# Patient Record
Sex: Male | Born: 1935 | Race: White | Hispanic: No | Smoking: Former smoker
Health system: Southern US, Community
[De-identification: ages and names within clinical notes are randomized; demographics above are authoritative.]

## PROBLEM LIST (undated history)

## (undated) DIAGNOSIS — M1990 Unspecified osteoarthritis, unspecified site: Secondary | ICD-10-CM

## (undated) DIAGNOSIS — G4733 Obstructive sleep apnea (adult) (pediatric): Secondary | ICD-10-CM

## (undated) DIAGNOSIS — C801 Malignant (primary) neoplasm, unspecified: Secondary | ICD-10-CM

## (undated) DIAGNOSIS — N200 Calculus of kidney: Secondary | ICD-10-CM

## (undated) DIAGNOSIS — I10 Essential (primary) hypertension: Secondary | ICD-10-CM

## (undated) DIAGNOSIS — F419 Anxiety disorder, unspecified: Secondary | ICD-10-CM

## (undated) DIAGNOSIS — I82402 Acute embolism and thrombosis of unspecified deep veins of left lower extremity: Secondary | ICD-10-CM

## (undated) DIAGNOSIS — J449 Chronic obstructive pulmonary disease, unspecified: Secondary | ICD-10-CM

## (undated) DIAGNOSIS — Z8551 Personal history of malignant neoplasm of bladder: Secondary | ICD-10-CM

## (undated) DIAGNOSIS — I4891 Unspecified atrial fibrillation: Secondary | ICD-10-CM

## (undated) DIAGNOSIS — F32A Depression, unspecified: Secondary | ICD-10-CM

## (undated) DIAGNOSIS — I499 Cardiac arrhythmia, unspecified: Secondary | ICD-10-CM

## (undated) DIAGNOSIS — R011 Cardiac murmur, unspecified: Secondary | ICD-10-CM

## (undated) DIAGNOSIS — E039 Hypothyroidism, unspecified: Secondary | ICD-10-CM

## (undated) DIAGNOSIS — J189 Pneumonia, unspecified organism: Secondary | ICD-10-CM

## (undated) DIAGNOSIS — R911 Solitary pulmonary nodule: Secondary | ICD-10-CM

## (undated) DIAGNOSIS — E785 Hyperlipidemia, unspecified: Secondary | ICD-10-CM

## (undated) DIAGNOSIS — Z9981 Dependence on supplemental oxygen: Secondary | ICD-10-CM

## (undated) DIAGNOSIS — M810 Age-related osteoporosis without current pathological fracture: Secondary | ICD-10-CM

## (undated) DIAGNOSIS — E119 Type 2 diabetes mellitus without complications: Secondary | ICD-10-CM

## (undated) DIAGNOSIS — F329 Major depressive disorder, single episode, unspecified: Secondary | ICD-10-CM

## (undated) HISTORY — PX: TONSILLECTOMY: SUR1361

## (undated) HISTORY — PX: VASCULAR SURGERY: SHX849

## (undated) HISTORY — DX: Chronic obstructive pulmonary disease, unspecified: J44.9

## (undated) HISTORY — PX: VARICOSE VEIN SURGERY: SHX832

## (undated) HISTORY — DX: Obstructive sleep apnea (adult) (pediatric): G47.33

## (undated) HISTORY — DX: Hypothyroidism, unspecified: E03.9

## (undated) HISTORY — DX: Anxiety disorder, unspecified: F41.9

## (undated) HISTORY — PX: EYE SURGERY: SHX253

## (undated) HISTORY — DX: Essential (primary) hypertension: I10

## (undated) HISTORY — DX: Personal history of malignant neoplasm of bladder: Z85.51

## (undated) HISTORY — DX: Solitary pulmonary nodule: R91.1

## (undated) HISTORY — DX: Unspecified atrial fibrillation: I48.91

## (undated) HISTORY — DX: Hyperlipidemia, unspecified: E78.5

## (undated) HISTORY — PX: MULTIPLE TOOTH EXTRACTIONS: SHX2053

---

## 1998-08-21 ENCOUNTER — Ambulatory Visit (HOSPITAL_COMMUNITY): Admission: RE | Admit: 1998-08-21 | Discharge: 1998-08-21 | Payer: Self-pay | Admitting: *Deleted

## 1998-08-21 ENCOUNTER — Encounter: Payer: Self-pay | Admitting: *Deleted

## 1998-11-23 ENCOUNTER — Ambulatory Visit (HOSPITAL_BASED_OUTPATIENT_CLINIC_OR_DEPARTMENT_OTHER): Admission: RE | Admit: 1998-11-23 | Discharge: 1998-11-23 | Payer: Self-pay | Admitting: *Deleted

## 1999-04-10 ENCOUNTER — Encounter: Admission: RE | Admit: 1999-04-10 | Discharge: 1999-04-10 | Payer: Self-pay | Admitting: *Deleted

## 1999-04-10 ENCOUNTER — Encounter: Payer: Self-pay | Admitting: *Deleted

## 1999-04-20 ENCOUNTER — Ambulatory Visit (HOSPITAL_COMMUNITY): Admission: RE | Admit: 1999-04-20 | Discharge: 1999-04-20 | Payer: Self-pay | Admitting: *Deleted

## 1999-04-20 ENCOUNTER — Encounter: Payer: Self-pay | Admitting: *Deleted

## 1999-05-03 ENCOUNTER — Encounter: Payer: Self-pay | Admitting: *Deleted

## 1999-05-03 ENCOUNTER — Ambulatory Visit (HOSPITAL_COMMUNITY): Admission: RE | Admit: 1999-05-03 | Discharge: 1999-05-03 | Payer: Self-pay | Admitting: *Deleted

## 1999-05-08 ENCOUNTER — Ambulatory Visit (HOSPITAL_COMMUNITY): Admission: RE | Admit: 1999-05-08 | Discharge: 1999-05-08 | Payer: Self-pay | Admitting: *Deleted

## 1999-05-08 ENCOUNTER — Encounter: Payer: Self-pay | Admitting: *Deleted

## 2003-09-12 ENCOUNTER — Ambulatory Visit (HOSPITAL_COMMUNITY): Admission: RE | Admit: 2003-09-12 | Discharge: 2003-09-12 | Payer: Self-pay | Admitting: Endocrinology

## 2004-07-04 ENCOUNTER — Ambulatory Visit (HOSPITAL_COMMUNITY): Admission: AD | Admit: 2004-07-04 | Discharge: 2004-07-07 | Payer: Self-pay | Admitting: Orthopaedic Surgery

## 2004-09-10 ENCOUNTER — Encounter: Admission: RE | Admit: 2004-09-10 | Discharge: 2004-09-10 | Payer: Self-pay | Admitting: Internal Medicine

## 2004-11-25 ENCOUNTER — Encounter: Admission: RE | Admit: 2004-11-25 | Discharge: 2004-11-25 | Payer: Self-pay | Admitting: Neurology

## 2006-02-14 ENCOUNTER — Ambulatory Visit (HOSPITAL_BASED_OUTPATIENT_CLINIC_OR_DEPARTMENT_OTHER): Admission: RE | Admit: 2006-02-14 | Discharge: 2006-02-14 | Payer: Self-pay | Admitting: Urology

## 2006-02-14 ENCOUNTER — Encounter (INDEPENDENT_AMBULATORY_CARE_PROVIDER_SITE_OTHER): Payer: Self-pay | Admitting: Specialist

## 2006-02-15 ENCOUNTER — Emergency Department (HOSPITAL_COMMUNITY): Admission: EM | Admit: 2006-02-15 | Discharge: 2006-02-15 | Payer: Self-pay | Admitting: Emergency Medicine

## 2006-05-23 ENCOUNTER — Encounter (INDEPENDENT_AMBULATORY_CARE_PROVIDER_SITE_OTHER): Payer: Self-pay | Admitting: Specialist

## 2006-05-23 ENCOUNTER — Ambulatory Visit (HOSPITAL_BASED_OUTPATIENT_CLINIC_OR_DEPARTMENT_OTHER): Admission: RE | Admit: 2006-05-23 | Discharge: 2006-05-23 | Payer: Self-pay | Admitting: Urology

## 2007-12-14 ENCOUNTER — Observation Stay (HOSPITAL_COMMUNITY): Admission: EM | Admit: 2007-12-14 | Discharge: 2007-12-15 | Payer: Self-pay | Admitting: Emergency Medicine

## 2007-12-25 HISTORY — PX: CARDIOVASCULAR STRESS TEST: SHX262

## 2008-02-21 ENCOUNTER — Ambulatory Visit (HOSPITAL_BASED_OUTPATIENT_CLINIC_OR_DEPARTMENT_OTHER): Admission: RE | Admit: 2008-02-21 | Discharge: 2008-02-21 | Payer: Self-pay | Admitting: Cardiovascular Disease

## 2008-02-27 ENCOUNTER — Ambulatory Visit: Payer: Self-pay | Admitting: Internal Medicine

## 2009-09-29 ENCOUNTER — Encounter: Admission: RE | Admit: 2009-09-29 | Discharge: 2009-09-29 | Payer: Self-pay | Admitting: Endocrinology

## 2009-11-07 ENCOUNTER — Ambulatory Visit: Payer: Self-pay | Admitting: Cardiovascular Disease

## 2010-03-27 ENCOUNTER — Other Ambulatory Visit: Payer: Self-pay | Admitting: Orthopaedic Surgery

## 2010-03-27 DIAGNOSIS — M545 Low back pain: Secondary | ICD-10-CM

## 2010-03-29 ENCOUNTER — Ambulatory Visit
Admission: RE | Admit: 2010-03-29 | Discharge: 2010-03-29 | Disposition: A | Discharge status: Home / Self Care | Payer: Managed Care, Other (non HMO) | Source: Ambulatory Visit | Attending: Orthopaedic Surgery | Admitting: Orthopaedic Surgery

## 2010-03-29 DIAGNOSIS — M545 Low back pain: Secondary | ICD-10-CM

## 2010-03-29 MED ORDER — GADOBENATE DIMEGLUMINE 529 MG/ML IV SOLN
20.0000 mL | Freq: Once | INTRAVENOUS | Status: AC | PRN
  Administered 2010-03-29: 20 mL via INTRAVENOUS

## 2010-06-13 ENCOUNTER — Encounter: Payer: Self-pay | Admitting: Cardiovascular Disease

## 2010-06-19 NOTE — Procedures (Signed)
NAMEJOSHUAL, [name] NO.:  [id number]   MEDICAL RECORD NO.:  [id number]          PATIENT TYPE:  OUT   LOCATION:  SLEEP CENTER                 FACILITY:  [facility]   PHYSICIAN:  Clinton D. Maple Hudson, MD, FCCP, FACPDATE OF BIRTH:  11-30[birth year]1930   DATE OF STUDY:  02/21/2008                            NOCTURNAL POLYSOMNOGRAM   REFERRING PHYSICIAN:  Vesta Mixer, M.D.   INDICATION FOR STUDY:  Hypersomnia with sleep apnea.   EPWORTH SLEEPINESS SCORE:  15/24.  BMI 32.9.  Weight 210 pounds, height  67 inches, neck 17 inches.   MEDICATIONS:  Home medications are charted and reviewed.   SLEEP ARCHITECTURE:  Total sleep time 307 minutes with sleep efficiency  75%.  Stage I was 3.6%.  Stage II 90.7%.  Stage III absent.  REM 5.7% of  total sleep time.  Sleep latency 31 minutes.  REM latency 99 minutes.  Awake after sleep onset 70 minutes.  Arousal index 19.3.  No bedtime  medication was taken.   RESPIRATORY DATA:  Apnea-hypopnea index (AHI) is 7.6 per hour.  A total  of 39 events were scored including 37 obstructive apneas and 2  hypopneas.  Events were not positional.  REM AHI 58.3.  There were  insufficient early events to permit CPAP titration by split protocol on  the study night.   OXYGEN DATA:  Moderate-to-loud snoring with oxygen desaturation to a  nadir of 80%.  Because of persistent oxygen desaturation the technician  placed 1L per minute of nasal oxygen at 11:02 p.m. and increased it to 2  L per minute at 1:14 a.m.  Mean oxygen saturation across the study was  91.1% with most of the study recorded on 2 L per minute.   CARDIAC DATA:  Sinus rhythm with occasional PVC.   MOVEMENT-PARASOMNIA:  Frequent limb movement with a total of 101 limb  jerks recorded of which 2 were associated with arousal or awakening for  an arousal index of 0.4 per hour.  Bathroom x2.   IMPRESSIONS-RECOMMENDATIONS:  1. Mild obstructive sleep apnea/hypopnea syndrome, AHI 7.6 per hour  with nonpositional events, moderately loud snoring and oxygen      desaturation to a nadir of 80%.  2. Significant oxygen desaturation with a nadir of 80% on room air.      Because of sustained oxygen desaturation most of the study was      recorded by protocol with the patient wearing 2 L per minute nasal      oxygen.  Mean oxygen saturation reflecting supplemental oxygen was      91.1% through the study.  This suggested presence of significant      underlying cardiopulmonary disease and potential requirement for      supplemental oxygen at home.  A total of 52.7 minutes was recorded      during this study with saturation less than 88%.  3. He had insufficient events to permit CPAP titration by split      protocol on the study night.  He may still be a      candidate for CPAP trial if more conservative measures are  insufficient.  Consider return for CPAP titration.  Reevaluate for      alternative management as appropriate.      Clinton D. Maple Hudson, MD, [facility], FACP  Diplomate, Biomedical engineer of Sleep Medicine  Electronically Signed     CDY/MEDQ  D:  02/27/2008 10:54:15  T:  02/28/2008 02:14:15  Job:  [id number]

## 2010-06-19 NOTE — Discharge Summary (Signed)
Taylor [name], Taylor [name] NO.:  [id number]   MEDICAL RECORD NO.:  [id number]          PATIENT TYPE:  INP   LOCATION:  2041                         FACILITY:  [facility]   PHYSICIAN:  Vesta Mixer, M.D. DATE OF BIRTH:  12/07/1928   DATE OF ADMISSION:  12/14/2007  DATE OF DISCHARGE:  12/15/2007                               DISCHARGE SUMMARY   DISCHARGE DIAGNOSES:  1. Noncardiac chest pain.  2. History of chronic obstructive pulmonary disease.  3. History of atrial fibrillation.  4. Hypothyroidism.  5. Hyperlipidemia.  6. Glaucoma.  7. Anxiety.  8. History of bladder cancer.  9. Small lung nodule, diagnosed by CT scan   DISCHARGE MEDICATIONS:  1. GlycoLax as needed.  2. Cartia XT 180 mg a day.  3. Synthroid 0.1 mg a day.  4. Aspirin 81 mg a day.  5. Lipitor 10 mg a day.  6. Serzone 75 mg a day.  7. Xanax 0.25 mg a day.  8. Flomax 50 mcg a day.  9. Xalatan eye drops once a day.  10.Tramadol with acetaminophen as needed.   DISPOSITION:  The patient will see Dr. Elease Hashimoto for a stress test in 1 or  2 weeks.  He is to call and ask for bed.  He is to see Dr. Evlyn Kanner for  further evaluation and management of his other problems.   HISTORY:  [name] is a 77 year old gentleman who is admitted to the  hospital with an episode of chest pain.  Please see dictated H&P for  further details.   HOSPITAL COURSE:  1. Chest pain.  The patient ruled out for myocardial infarction.  He      did not have any EKG changes.  He did have a CT scan, which ruled      out pulmonary embolus.  He was found to have a small pulmonary      nodule which was not thought to be serious.  Recommendation was      made for followup CT scan at some point in the future.  We      will see him as an outpatient and set up a stress Cardiolite study.  2. COPD.  The patient did have some wheezing.  He was given some      Xopenex in the hospital.  He may need an inhaler as an outpatient.      Dr. Evlyn Kanner  will address this further.  All of his other medical      problems were stable.      Vesta Mixer, M.D.  Electronically Signed     PJN/MEDQ  D:  12/15/2007  T:  12/16/2007  Job:  [id number]   cc:   Jeannett Senior A. Evlyn Kanner, M.D.

## 2010-06-19 NOTE — H&P (Signed)
NAMEFREEMAN, BORBA NO.:  [id number]   MEDICAL RECORD NO.:  [id number]          PATIENT TYPE:  INP   LOCATION:  2041                         FACILITY:  [facility]   PHYSICIAN:  Vesta Mixer, M.D. DATE OF BIRTH:  16-Jul-1928   DATE OF ADMISSION:  12/14/2007  DATE OF DISCHARGE:                              HISTORY & PHYSICAL   Taylor [name] is a 77 year old gentleman who is admitted with some  episodes of chest pain.   Mr. Rappaport has a long history of cigarette smoking.  He had some  intermittent atrial fibrillation and hypothyroidism.  He has had a  stress test in our office which was negative for ischemia.  The patient  has been in fairly good health.  He started having a little bit of chest  pain perhaps as early as last night but certainly this morning he  developed some severe chest pain.  It worsened with the rest.  There was  no diaphoresis.  He does not get any regular exercise.  It did not  particularly worsened with movement.  The pain lasted for about an hour  and was off and on.  The family was relieved when he arrived in the  emergency room.  He did not have any nausea or vomiting.  He denies any  hemoptysis or blood in his stools.   He denies any syncope or presyncope.   CURRENT MEDICATIONS:  1. Cartia XT 180 mg a day.  2. Synthroid 0.1 mg a day.  3. Aspirin 81 mg a day.  4. Lipitor 10 mg a day.  5. Serzone 75 mg a day.  6. Xanax 0.25 mg nightly as needed.  7. Flonase 50 mcg a day.  8. Xalatan eye drops.  9. Tramadol with acetaminophen 50 mg four times a day as needed.   ALLERGIES:  None.   PAST MEDICAL HISTORY:  1. History of bladder cancer.  2. Probable COPD.  3. Glaucoma.  4. Hypercholesterolemia.  5. Hypothyroidism.  6. Skin cancer.  7. Status post history of a TURP based on history of back surgery.   SOCIAL HISTORY:  The patient smokes two pack of cigarettes a day.   Family history is unremarkable.   His review of systems is  reviewed and is essentially negative except as  noted in the HPI.   PHYSICAL EXAMINATION:  GENERAL:  He is an elderly gentleman in no acute  distress.  He is alert and oriented x3 and his mood and affect are  normal.  VITAL SIGNS:  His blood pressure is 136/62 with a heart rate of 90.  HEENT:  Carotids 2+, he has no bruits, no JVD, no thyromegaly.  LUNG:  Bilateral wheezing.  HEART:  Regular rate, S1-S2.  ABDOMEN:  Good bowel sounds and is nontender.  EXTREMITIES:  There is no clubbing, cyanosis or edema.  NEURO:  Nonfocal.   His EKG reveals normal sinus rhythm.  There is no ST or T-wave changes  and EKG is essentially unchanged from his previous tracing.   LABORATORY DATA:  His white blood cell count is 8.8, his  hemoglobin is  17.8, hematocrit is 52.6.  His sodium is 130, potassium is 3.6,  chlorides 103, CO2 is 29 chloride, BUN is 10, creatinine is 0.84,  glucose is 189.  His BNP is less than 30.  His CPK-MB is 1.1.  His  troponin is less than 0.05.   IMPRESSION AND PLAN:  Chest pain.  The patient presents with some rather  atypical chest pains.  He is a heavy smoker, and he does have  hyperglycemia.  We will collect serial cardiac enzymes.  In the morning,  I would like to do a CT angio to rule out a pulmonary embolus.  We will  anticipate discharging him tomorrow morning if his enzymes return  negative and his EKG remains unremarkable.  He will need some attention  with his hyperglycemia.  We will collect Accu-Cheks tonight.   He also has elevated hemoglobin level.  I suspect that he has either  some sleep apnea or perhaps this is due to his smoking.  This may need  to be addressed as an outpatient.      Vesta Mixer, M.D.  Electronically Signed     PJN/MEDQ  D:  12/14/2007  T:  12/15/2007  Job:  [id number]   cc:   Jeannett Senior A. Evlyn Kanner, M.D.

## 2010-06-22 NOTE — Op Note (Signed)
Taylor [name], Taylor [name]               ACCOUNT NO.:  [id number]   MEDICAL RECORD NO.:  [id number]          PATIENT TYPE:  AMB   LOCATION:  [facility]                         FACILITY:  [facility]   PHYSICIAN:  Mark C. Vernie Ammons, M.D.  DATE OF BIRTH:  1928/03/04   DATE OF PROCEDURE:  02/14/2006  DATE OF DISCHARGE:                               OPERATIVE REPORT   PREOPERATIVE DIAGNOSIS:  Bladder tumor.   POSTOPERATIVE DIAGNOSIS:  Bladder tumor.   PROCEDURE:  Transurethral resection of bladder tumor approximately 2.5  cm in size.   SURGEON:  Dr. Vernie Ammons   ANESTHESIA:  General.   DRAINS:  None.   SPECIMENS:  Bladder tumor to pathology.   BLOOD LOSS:  Minimal.   COMPLICATIONS:  None.   INDICATIONS:  The patient is a 77 year old heavy smoker who had gross  painless hematuria found by his primary care physician and sent for  further evaluation.  Upper tract evaluation was negative; however,  cystoscopy revealed papillary tumor on the posterior superior wall the  bladder.  He is brought to the OR today for transurethral resection of  this lesion.  I have discussed the procedures, its risks, complications,  alternatives and limitations.  He understands and elected to proceed.   DESCRIPTION OF OPERATION:  After informed consent, the patient was  brought to the major OR, placed on the table, administered general  anesthesia, then moved to the dorsal lithotomy position.  His genitalia  was sterilely prepped and draped.  Meatus would not accept the 28-French  resectoscope sheath with Timberlake obturator; therefore, it was dilated  with Sissy Hoff sounds to 30-French without difficulty.  I then was able  to pass the resectoscope sheath and obturator without difficulty.  The  resectoscope element with 12-degree lens was then inserted, and the  bladder was entered and fully inspected.  The tumor that I had seen in  the office on the posterior superior wall was easily identified in the  midline.  The  remainder of the bladder was fully inspected, and no other  tumor, stones, or inflammatory lesions were seen.  Ureteral orifices  normal configuration and position.   Resection was then begun by first resecting away the papillary portion  of the tumor.  Below this, I found what appeared to be some papillary  changes of the underlying mucosa.  I then resected this portion of the  bladder and obtained some deeper bites through the mucosa and into the  muscularis of the bladder.  No perforation occurred.  Bleeding points  were then cauterized, and then I cauterized circumferentially around the  resected location as well as the base.  The specimens were sent in two  portions, the first being the papillary portion, the second being the  sessile portion including a portion of the muscularis.  I then drained  the bladder and removed the resectoscope.  The patient was awakened and  taken to recovery room in stable satisfactory condition.  He tolerated  the procedure well with no intraoperative complications.   He will be given a prescription for 38 Vicodin HP and 38 Pyridium  Plus.  I will have him return to the office to discuss the pathology results  and further treatment based upon that in 14 days.      Mark C. Vernie Ammons, M.D.  Electronically Signed     MCO/MEDQ  D:  02/14/2006  T:  02/14/2006  Job:  [id number]

## 2010-06-22 NOTE — Op Note (Signed)
NAMEMARKIAN, GLOCKNER               ACCOUNT NO.:  [id number]   MEDICAL RECORD NO.:  [id number]          PATIENT TYPE:  AMB   LOCATION:  [facility]                         [facility]ACILITY:  [facility]   PHYSICIAN:  Mark C. Vernie Ammons, M.D.  DATE OF BIRTH:  Jun 17, 1928   DATE OF PROCEDURE:  05/23/2006  DATE OF DISCHARGE:                               OPERATIVE REPORT   PREOPERATIVE DIAGNOSIS:  History of transitional cell carcinoma of the  bladder.   POSTOPERATIVE DIAGNOSIS:  History of transitional cell carcinoma of the  bladder.   PROCEDURE:  Cystoscopy with bladder biopsies.   SURGEON:  Mark C. Vernie Ammons, M.D.   ANESTHESIA:  General.   SPECIMENS:  Cold cup biopsies of the previous tumor site on the right  posterior wall and biopsy of left posterior wall.   ESTIMATED BLOOD LOSS:  Zero.   DRAINS:   COMPLICATIONS:  None.   INDICATIONS:  The patient is a 77 year old smoker who was found to have  a bladder tumor, which 3 months ago was resected and revealed high-grade  papillary urothelial carcinoma with no evidence of invasion into the  smooth muscle or lamina propria; however, there was carcinoma in situ  associated with the lesion.  He is brought back to the operating room  today for repeat biopsy.  The risks, complications and alternatives were  discussed.  He understands and elected to proceed.   DESCRIPTION OF OPERATION:  After informed consent, the patient was  brought to the major OR, placed on the table, administered general  anesthesia, then moved to the dorsal lithotomy position.  Genitalia was  sterilely prepped and draped and a 21-French cystoscope with 12-degrees  lens was inserted.  The urethra was noted to be normal down to the  bulbar urethra were a mild stricture was noted, but it allowed easy  passage of the scope.  The prostatic urethra revealed no lesions and was  really nonobstructing with no significant lateral lobe hypertrophy.  Upon entering the bladder, I noted the  ureteral orifices normal  configuration and position.  A scar was in the right posterior wall, the  location of his previous resection, and associated with this appeared to  be possibly some superficial papillary lesion that was minute (than 1 mm  in size).  No other erythematous patches or worrisome lesions were found  on full inspection of the entire bladder.   The cold cup biopsy forceps was then inserted and the area of greatest  suspicion was biopsied.  I also biopsied areas around that as well.  These were all sent as one specimen and labeled, biopsy of previous  tumor site.  I then obtained a biopsy of the contralateral wall and  sent this separately.  I then used the Bugbee electrode to fulgurate the  biopsy sites but no significant bleeding was noted.  The bladder was  drained and the patient was awakened and taken to the recovery in  stable, satisfactory condition.  He tolerated the procedure well with no  intraoperative complications.   He received 0.8 mg of Flomax preop and will be monitored postoperatively  for any voiding difficulty.  I will tentatively schedule him for follow-  up in 3 months pending the results of the biopsy and, if positive, will  need to make modification of that.  He will also be given a prescription  for Pyridium for bladder irritation.      Mark C. Vernie Ammons, M.D.  Electronically Signed     MCO/MEDQ  D:  05/23/2006  T:  05/23/2006  Job:  [id number]

## 2010-06-22 NOTE — Op Note (Signed)
Taylor [name], Taylor [name] NO.:  [id number]   MEDICAL RECORD NO.:  [id number]          PATIENT TYPE:  OIB   LOCATION:  2899                         FACILITY:  [facility]   PHYSICIAN:  Sharolyn Douglas, M.D.        DATE OF BIRTH:  03/22/1928   DATE OF PROCEDURE:  07/04/2004  DATE OF DISCHARGE:                                 OPERATIVE REPORT   DIAGNOSES:  Lumbar spinal stenosis, herniated nucleus pulposis and foraminal  narrowing with left lower extremity radiculopathy.   PROCEDURES:  1.  Revision L3 and L4 laminectomy with lateral recess decompression.  2.  Foraminotomy of L3-4 and L4-5.  3.  Diskectomy L3-4.   SURGEON:  Sharolyn Douglas, M.D.   ASSISTANT:  Verlin Fester, P.A.   ANESTHESIA:  General endotracheal.   COMPLICATIONS:  None.   ESTIMATED BLOOD LOSS:  Minimal.   INDICATIONS:  The patient is a pleasant gentleman with chronic severe back  and left leg pain. He has failed to respond to multiple conservative  measures. He is taking escalating doses of narcotics. His plain radiographs  and MRI scan demonstrate foraminal narrowing at L3-4 with moderate central  stenosis. There is lateral recess narrowing bilaterally left greater than  right secondary to hypertrophy of the joint, as well as disk protrusion. He  has had a previous laminotomy at L4-5. He has elected to undergo revision  decompression in hopes of improving his symptoms. He knows the risk of  continued pain, nonbenefit and the need for additional surgery, including  spinal fusion.   DESCRIPTION OF PROCEDURE:  The patient was identified in the holding area,  taken to the operating room, underwent general endotracheal anesthesia  without difficulty given and prophylactic IV antibiotics. Carefully  positioned prone on the Wilson frame. All bony prominences padded. The face  and eyes were protected at all times. Back prepped and draped in usual  sterile fashion. The previous midline incision was utilized and  extended  several centimeters proximally. Dissection was carried through the scar  tissue. The paraspinal muscles were elevated out over the lamina of L3 and  L4. Care was taken to protect the facette joint capsules. The deep retractor  was placed. Intraoperative x-ray taken to confirm location. Curettes used to  remove the epidural fibrosis, developing a plane between the dura and the  undersurface of the lamina of L3. Leksell rongeur used to remove the entire  spinous process of L3, as well as the lamina. Kerrison punches used to  complete a central laminectomy. The laminectomy was then extended laterally  flush with the pedicles bilaterally. Special attention was paid to the left  side where the patient was most symptomatic. The L3 nerve root was  identified and a foraminotomy was completed out the L3-4 foramen. Care was  taken to preserve the pars interarticularis.  Similarly the L4 nerve root  was identified and again decompressed within the spinal canal and then down  the L4-5 foramen. When we were done, we felt we had a good decompression of  the thecal sac and nerve roots. The wound was irrigated.  The L3-4 disk was  then evaluated and there was a focal disk protrusion at the axilla of the L4  nerve root. This was displacing the nerve root against the residual lamina.  We therefore elected to enter the disk space and debride the disk  protrusion. Epstein curettes were used to push the herniated disk material  back into the interspace, which was then removed with a pituitary. The disk  space itself was very empty and degenerative. There was very little disk  material found within the intervertebral space. The wound was irrigated.  Gelfoam was left over the exposed epidural space. The deep fascia was closed  with an 0 Vicryl. Subcutaneous layer closed with 0 Vicryl and 2-0 Vicryl  followed by a running 3-0 subcuticular Vicryl suture on the skin edges.  Benzoin and Steri-Strips placed.  The patient was turned supine, extubated  without difficulty and transferred to the recovery room stable condition and  able to move his upper and lower extremities.      MC/MEDQ  D:  07/04/2004  T:  07/04/2004  Job:  [id number]

## 2010-06-22 NOTE — H&P (Signed)
NAME:  Taylor [name], RIPBERGER NO.:  [id number]   MEDICAL RECORD NO.:  [id number]          PATIENT TYPE:  OIB   LOCATION:  NA                           FACILITY:  [facility]   PHYSICIAN:  Sharolyn Douglas, M.D.        DATE OF BIRTH:  09-14-1928   DATE OF ADMISSION:  DATE OF DISCHARGE:                                HISTORY & PHYSICAL   This history and physical was performed at our office on Jun 28, 2004.  Today, the patient was fitted with a lumbosacral orthosis which will be used  postoperatively.   DATE OF ADMISSION:  Jul 04, 2004   CHIEF COMPLAINT:  Pain in my back and hip and left leg.   PRESENT ILLNESS:  A 77 year old white male seen by [place] for continuing and  progressive problems concerning pain into the low back with radiation in the  lower extremity, more so on the left than the right. Unfortunately, pain now  is disabling to the point where he really is not able to do day-to-day  activities. He is quite miserable nearly all of the time. Examination shows  a negative straight leg raise bilaterally and fortunately he has maintained  good strength and sensation in the lower extremities. MRI has shown a  previous laminectomy at 4-5 but he has multiple and multilevel degenerative  changes throughout the lumbar spine seen mostly at L3-L4 on the left where a  disc protrusion is noted as well as an osteophyte. These violate the foramen  on the left. Due to the fact that this patient really is quite miserable and  frustrated and failed in an exercise program as well as analgesics, it is  felt that he would benefit from surgical intervention and being admitted for  surgical correction with an L3-L4 lumbar laminectomy.   PAST MEDICAL HISTORY:  The patient has no medical allergies. Dr. Adrian Prince is his medical physician.   Current medications are:  1.  Cartia XT 180/24 CD one daily.  2.  Synthroid 0.075 mg one daily and a half a pill on Sunday.  3.  Aspirin 81 mg one daily  (will stop prior to surgery).  4.  Lipitor 10 mg one daily in the p.m.  5.  Serzone 75 mg one daily in the p.m.  6.  Xanax 0.5 mg one-half tablet h.s.  7.  Flonase two puffs each nostril daily.  8.  Clotrimazole cream p.r.n.  9.  GlycoLax 17 g p.r.n.  10. Hydrocodone p.r.n.  11. He has glaucoma and takes Xalatan one drop each eye daily. I have      instructed him to bring those drops with him to the hospital.  12. He also takes Lyrica 50 mg one at bedtime.   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   SOCIAL HISTORY:  The patient is divorced, retired, smokes two packs of  cigarettes per day. No intake of alcohol products. His daughter will be  caregiver after surgery.   FAMILY HISTORY:  Positive for heart disease, diabetes, and cancer.   REVIEW OF SYSTEMS:  CNS:  No seizure disorder, paralysis, double vision. The  patient does have a radicular pattern in the buttocks and leg consistent  with present illness. CARDIOVASCULAR:  No chest pain, no angina or  orthopnea. RESPIRATORY:  No productive cough, no hemoptysis, no shortness of  breath. GASTROINTESTINAL:  No nausea, vomiting, melena, bloody stools.  GENITOURINARY:  No discharge, dysuria, hematuria. MUSCULOSKELETAL:  Primarily in present illness.   PHYSICAL EXAMINATION:  GENERAL:  Alert and cooperative, friendly 77 year old  white male who walks with an antalgic limp to the left.  VITAL SIGNS:  Blood pressure 120/66 seated, pulse 72 and regular,  respirations 14 and unlabored.  HEENT:  Normocephalic, PERRLA, EOM intact, oropharynx is clear.  CHEST:  Clear to auscultation. No rhonchi, no  rales.  HEART:  Regular rate and rhythm. No murmurs are heard.  ABDOMEN:  Soft, nontender, obese. Liver and spleen not felt.  GENITALIA, RECTAL:  Not done, not pertinent to present illness.  EXTREMITIES:  As in present illness above.   ADMISSION DIAGNOSES:  1.  L3-L4 lateral recess and foraminal stenosis.  2.  Hypothyroidism.  3.  Dyslipidemia.  4.  Glaucoma.   PLAN:  The patient will undergo L3-L4 laminectomy. Should we have any  medical problems, we will certainly call Dr. Adrian Prince to follow along  with [place] during this patient's hospitalization.      DLU/MEDQ  D:  06/29/2004  T:  06/29/2004  Job:  [id number]   cc:   Jeannett Senior A. Evlyn Kanner, M.D.  [address]  [place]  [place] [address]  Fax: [phone]

## 2010-08-09 ENCOUNTER — Emergency Department (HOSPITAL_COMMUNITY)
Admission: EM | Admit: 2010-08-09 | Discharge: 2010-08-09 | Disposition: A | Discharge status: Home / Self Care | Payer: Medicare Other | Attending: Emergency Medicine | Admitting: Emergency Medicine

## 2010-08-09 DIAGNOSIS — R1013 Epigastric pain: Secondary | ICD-10-CM | POA: Insufficient documentation

## 2010-08-09 DIAGNOSIS — M79609 Pain in unspecified limb: Secondary | ICD-10-CM | POA: Insufficient documentation

## 2010-08-09 DIAGNOSIS — R0789 Other chest pain: Secondary | ICD-10-CM | POA: Insufficient documentation

## 2010-08-09 DIAGNOSIS — E7800 Pure hypercholesterolemia, unspecified: Secondary | ICD-10-CM | POA: Insufficient documentation

## 2010-08-09 LAB — DIFFERENTIAL
Basophils Absolute: 0 10*3/uL (ref 0.0–0.1)
Eosinophils Relative: 4 % (ref 0–5)
Lymphocytes Relative: 22 % (ref 12–46)
Lymphs Abs: 1.8 10*3/uL (ref 0.7–4.0)
Monocytes Absolute: 0.7 10*3/uL (ref 0.1–1.0)

## 2010-08-09 LAB — COMPREHENSIVE METABOLIC PANEL
Albumin: 3.5 g/dL (ref 3.5–5.2)
Alkaline Phosphatase: 96 U/L (ref 39–117)
BUN: 15 mg/dL (ref 6–23)
CO2: 31 mEq/L (ref 19–32)
Calcium: 9.4 mg/dL (ref 8.4–10.5)
Glucose, Bld: 99 mg/dL (ref 70–99)
Potassium: 4.8 mEq/L (ref 3.5–5.1)

## 2010-08-09 LAB — CBC
HCT: 55.5 % — ABNORMAL HIGH (ref 39.0–52.0)
MCH: 32.6 pg (ref 26.0–34.0)
MCV: 96.9 fL (ref 78.0–100.0)
RBC: 5.73 MIL/uL (ref 4.22–5.81)
RDW: 13 % (ref 11.5–15.5)
WBC: 8.1 10*3/uL (ref 4.0–10.5)

## 2010-08-09 LAB — CK TOTAL AND CKMB (NOT AT ARMC): Relative Index: INVALID (ref 0.0–2.5)

## 2010-08-10 ENCOUNTER — Telehealth: Payer: Self-pay | Admitting: Cardiovascular Disease

## 2010-08-10 NOTE — Telephone Encounter (Signed)
Wants to speak with RN regarding his CP he experienced yesterday. He went to hospital and had blood work. They said he had Atypical chest pain.

## 2010-08-10 NOTE — Telephone Encounter (Signed)
i called pt and he stated he was walking out the door for a funeral and would contact me next week in regards to atypical cp.Alfonso Ramus RN

## 2010-08-13 ENCOUNTER — Telehealth: Payer: Self-pay | Admitting: Cardiovascular Disease

## 2010-08-13 NOTE — Telephone Encounter (Signed)
C/o NOT pain but discomfort left arm, neck and head. Had ems check him and went to hosp. Nothing found in ekg or labs. DX was atypical chest pain, He is just telling us as a FYI.  Alfonso Ramus RN

## 2010-11-06 LAB — COMPREHENSIVE METABOLIC PANEL
BUN: 11
CO2: 29
Calcium: 9.1
Chloride: 103
Creatinine, Ser: 0.8
GFR calc Af Amer: >60
GFR calc non Af Amer: >60
Glucose, Bld: 189 — ABNORMAL HIGH
Total Bilirubin: 1.4 — ABNORMAL HIGH

## 2010-11-06 LAB — BASIC METABOLIC PANEL
BUN: 10
GFR calc Af Amer: >60
GFR calc non Af Amer: >60
Glucose, Bld: 189 — ABNORMAL HIGH

## 2010-11-06 LAB — CARDIAC PANEL(CRET KIN+CKTOT+MB+TROPI)
Relative Index: INVALID
Relative Index: INVALID
Total CK: 37
Total CK: 71
Troponin I: 0.01

## 2010-11-06 LAB — CBC
HCT: 51.6
HCT: 52.6 — ABNORMAL HIGH
Hemoglobin: 17.3 — ABNORMAL HIGH
Hemoglobin: 17.8 — ABNORMAL HIGH
MCHC: 33.5
MCV: 97
RBC: 5.32
RBC: 5.4
WBC: 8.4
WBC: 8.8

## 2010-11-06 LAB — GLUCOSE, CAPILLARY
Glucose-Capillary: 132 — ABNORMAL HIGH
Glucose-Capillary: 152 — ABNORMAL HIGH
Glucose-Capillary: 388 — ABNORMAL HIGH

## 2010-11-06 LAB — DIFFERENTIAL
Basophils Absolute: 0
Basophils Relative: 0
Eosinophils Absolute: 0.1
Lymphs Abs: 1
Monocytes Absolute: 1.1 — ABNORMAL HIGH
Monocytes Relative: 12

## 2010-11-06 LAB — HEMOGLOBIN A1C
Hgb A1c MFr Bld: 6.5 — ABNORMAL HIGH
Mean Plasma Glucose: 140

## 2010-11-06 LAB — POCT CARDIAC MARKERS
CKMB, poc: 1.1
Myoglobin, poc: 47.5

## 2010-11-06 LAB — URINE MICROSCOPIC-ADD ON

## 2010-11-06 LAB — URINALYSIS, ROUTINE W REFLEX MICROSCOPIC
Glucose, UA: NEGATIVE
Hgb urine dipstick: NEGATIVE
Ketones, ur: NEGATIVE
Protein, ur: 100 — AB

## 2010-11-07 ENCOUNTER — Other Ambulatory Visit: Payer: Self-pay | Admitting: Orthopedic Surgery

## 2010-11-07 DIAGNOSIS — M545 Low back pain: Secondary | ICD-10-CM

## 2010-11-08 ENCOUNTER — Ambulatory Visit
Admission: RE | Admit: 2010-11-08 | Discharge: 2010-11-08 | Disposition: A | Discharge status: Home / Self Care | Payer: Medicare Other | Source: Ambulatory Visit | Attending: Orthopedic Surgery | Admitting: Orthopedic Surgery

## 2010-11-08 DIAGNOSIS — M545 Low back pain: Secondary | ICD-10-CM

## 2010-11-23 ENCOUNTER — Encounter: Payer: Self-pay | Admitting: Cardiovascular Disease

## 2010-11-27 ENCOUNTER — Emergency Department (HOSPITAL_COMMUNITY): Payer: Medicare Other

## 2010-11-27 ENCOUNTER — Inpatient Hospital Stay (HOSPITAL_COMMUNITY)
Admission: EM | Admit: 2010-11-27 | Discharge: 2010-12-01 | DRG: 189 | Disposition: A | Discharge status: Home Health | Payer: Medicare Other | Attending: Endocrinology | Admitting: Endocrinology

## 2010-11-27 ENCOUNTER — Encounter (HOSPITAL_COMMUNITY): Payer: Self-pay | Admitting: Radiology

## 2010-11-27 DIAGNOSIS — E890 Postprocedural hypothyroidism: Secondary | ICD-10-CM | POA: Diagnosis present

## 2010-11-27 DIAGNOSIS — Z7982 Long term (current) use of aspirin: Secondary | ICD-10-CM

## 2010-11-27 DIAGNOSIS — J9600 Acute respiratory failure, unspecified whether with hypoxia or hypercapnia: Principal | ICD-10-CM | POA: Diagnosis present

## 2010-11-27 DIAGNOSIS — E119 Type 2 diabetes mellitus without complications: Secondary | ICD-10-CM | POA: Diagnosis present

## 2010-11-27 DIAGNOSIS — I714 Abdominal aortic aneurysm, without rupture, unspecified: Secondary | ICD-10-CM | POA: Diagnosis present

## 2010-11-27 DIAGNOSIS — Z8551 Personal history of malignant neoplasm of bladder: Secondary | ICD-10-CM

## 2010-11-27 DIAGNOSIS — Z86718 Personal history of other venous thrombosis and embolism: Secondary | ICD-10-CM

## 2010-11-27 DIAGNOSIS — E559 Vitamin D deficiency, unspecified: Secondary | ICD-10-CM | POA: Diagnosis present

## 2010-11-27 DIAGNOSIS — J441 Chronic obstructive pulmonary disease with (acute) exacerbation: Secondary | ICD-10-CM | POA: Diagnosis present

## 2010-11-27 DIAGNOSIS — E46 Unspecified protein-calorie malnutrition: Secondary | ICD-10-CM | POA: Diagnosis not present

## 2010-11-27 DIAGNOSIS — H409 Unspecified glaucoma: Secondary | ICD-10-CM | POA: Diagnosis present

## 2010-11-27 DIAGNOSIS — I10 Essential (primary) hypertension: Secondary | ICD-10-CM | POA: Diagnosis present

## 2010-11-27 DIAGNOSIS — R7989 Other specified abnormal findings of blood chemistry: Secondary | ICD-10-CM

## 2010-11-27 DIAGNOSIS — I658 Occlusion and stenosis of other precerebral arteries: Secondary | ICD-10-CM | POA: Diagnosis present

## 2010-11-27 DIAGNOSIS — E279 Disorder of adrenal gland, unspecified: Secondary | ICD-10-CM | POA: Diagnosis present

## 2010-11-27 DIAGNOSIS — G4730 Sleep apnea, unspecified: Secondary | ICD-10-CM | POA: Diagnosis present

## 2010-11-27 DIAGNOSIS — R0902 Hypoxemia: Secondary | ICD-10-CM

## 2010-11-27 DIAGNOSIS — Z79899 Other long term (current) drug therapy: Secondary | ICD-10-CM

## 2010-11-27 DIAGNOSIS — F329 Major depressive disorder, single episode, unspecified: Secondary | ICD-10-CM | POA: Diagnosis present

## 2010-11-27 DIAGNOSIS — E785 Hyperlipidemia, unspecified: Secondary | ICD-10-CM | POA: Diagnosis present

## 2010-11-27 DIAGNOSIS — F3289 Other specified depressive episodes: Secondary | ICD-10-CM | POA: Diagnosis present

## 2010-11-27 DIAGNOSIS — I2510 Atherosclerotic heart disease of native coronary artery without angina pectoris: Secondary | ICD-10-CM | POA: Diagnosis present

## 2010-11-27 DIAGNOSIS — Z85828 Personal history of other malignant neoplasm of skin: Secondary | ICD-10-CM

## 2010-11-27 DIAGNOSIS — I6529 Occlusion and stenosis of unspecified carotid artery: Secondary | ICD-10-CM | POA: Diagnosis present

## 2010-11-27 DIAGNOSIS — D45 Polycythemia vera: Secondary | ICD-10-CM | POA: Diagnosis present

## 2010-11-27 DIAGNOSIS — I4892 Unspecified atrial flutter: Secondary | ICD-10-CM | POA: Diagnosis present

## 2010-11-27 DIAGNOSIS — F17200 Nicotine dependence, unspecified, uncomplicated: Secondary | ICD-10-CM | POA: Diagnosis present

## 2010-11-27 LAB — CBC
HCT: 54.2 % — ABNORMAL HIGH (ref 39.0–52.0)
Hemoglobin: 18 g/dL — ABNORMAL HIGH (ref 13.0–17.0)
MCH: 32.7 pg (ref 26.0–34.0)
MCHC: 33.2 g/dL (ref 30.0–36.0)
RBC: 5.5 MIL/uL (ref 4.22–5.81)

## 2010-11-27 LAB — COMPREHENSIVE METABOLIC PANEL
ALT: 9 U/L (ref 0–53)
Alkaline Phosphatase: 115 U/L (ref 39–117)
BUN: 11 mg/dL (ref 6–23)
CO2: 35 mEq/L — ABNORMAL HIGH (ref 19–32)
Calcium: 9.6 mg/dL (ref 8.4–10.5)
GFR calc Af Amer: >90 mL/min (ref >90)
GFR calc non Af Amer: 87 mL/min — ABNORMAL LOW (ref >90)
Glucose, Bld: 134 mg/dL — ABNORMAL HIGH (ref 70–99)
Potassium: 4.5 mEq/L (ref 3.5–5.1)
Sodium: 141 mEq/L (ref 135–145)

## 2010-11-27 LAB — DIFFERENTIAL
Basophils Absolute: 0 10*3/uL (ref 0.0–0.1)
Eosinophils Relative: 1 % (ref 0–5)
Lymphocytes Relative: 8 % — ABNORMAL LOW (ref 12–46)
Lymphs Abs: 1.3 10*3/uL (ref 0.7–4.0)
Monocytes Relative: 10 % (ref 3–12)
Neutro Abs: 12.9 10*3/uL — ABNORMAL HIGH (ref 1.7–7.7)

## 2010-11-27 LAB — POCT I-STAT 3, ART BLOOD GAS (G3+)
Bicarbonate: 34.3 mEq/L — ABNORMAL HIGH (ref 20.0–24.0)
pCO2 arterial: 67.5 mmHg (ref 35.0–45.0)
pH, Arterial: 7.313 — ABNORMAL LOW (ref 7.350–7.450)
pO2, Arterial: 61 mmHg — ABNORMAL LOW (ref 80.0–100.0)

## 2010-11-27 LAB — URINALYSIS, ROUTINE W REFLEX MICROSCOPIC
Bilirubin Urine: NEGATIVE
Ketones, ur: NEGATIVE mg/dL
Leukocytes, UA: NEGATIVE
Nitrite: NEGATIVE
Urobilinogen, UA: 0.2 mg/dL (ref 0.0–1.0)

## 2010-11-27 LAB — GLUCOSE, CAPILLARY

## 2010-11-27 LAB — MAGNESIUM: Magnesium: 2 mg/dL (ref 1.5–2.5)

## 2010-11-27 LAB — URINE MICROSCOPIC-ADD ON

## 2010-11-27 LAB — PHOSPHORUS: Phosphorus: 4.6 mg/dL (ref 2.3–4.6)

## 2010-11-27 LAB — PRO B NATRIURETIC PEPTIDE: Pro B Natriuretic peptide (BNP): 311.9 pg/mL (ref 0–450)

## 2010-11-27 LAB — PROCALCITONIN: Procalcitonin: <0.1 ng/mL

## 2010-11-28 ENCOUNTER — Inpatient Hospital Stay (HOSPITAL_COMMUNITY): Payer: Medicare Other

## 2010-11-28 LAB — COMPREHENSIVE METABOLIC PANEL
Albumin: 3.3 g/dL — ABNORMAL LOW (ref 3.5–5.2)
Alkaline Phosphatase: 97 U/L (ref 39–117)
BUN: 11 mg/dL (ref 6–23)
Calcium: 9.3 mg/dL (ref 8.4–10.5)
Creatinine, Ser: 0.57 mg/dL (ref 0.50–1.35)
GFR calc Af Amer: >90 mL/min (ref >90)
Potassium: 3.9 mEq/L (ref 3.5–5.1)
Total Protein: 7.4 g/dL (ref 6.0–8.3)

## 2010-11-28 LAB — DIFFERENTIAL
Basophils Absolute: 0 10*3/uL (ref 0.0–0.1)
Basophils Relative: 0 % (ref 0–1)
Eosinophils Relative: 0 % (ref 0–5)
Lymphocytes Relative: 7 % — ABNORMAL LOW (ref 12–46)
Neutro Abs: 9.9 10*3/uL — ABNORMAL HIGH (ref 1.7–7.7)

## 2010-11-28 LAB — CBC
HCT: 49.4 % (ref 39.0–52.0)
Hemoglobin: 16.2 g/dL (ref 13.0–17.0)
RDW: 13.8 % (ref 11.5–15.5)
WBC: 10.9 10*3/uL — ABNORMAL HIGH (ref 4.0–10.5)

## 2010-11-28 LAB — GLUCOSE, CAPILLARY
Glucose-Capillary: 191 mg/dL — ABNORMAL HIGH (ref 70–99)
Glucose-Capillary: 260 mg/dL — ABNORMAL HIGH (ref 70–99)
Glucose-Capillary: 245 mg/dL — ABNORMAL HIGH (ref 70–99)

## 2010-11-28 LAB — CARDIAC PANEL(CRET KIN+CKTOT+MB+TROPI)
CK, MB: 3.4 ng/mL (ref 0.3–4.0)
CK, MB: 5.5 ng/mL — ABNORMAL HIGH (ref 0.3–4.0)
CK, MB: 7 ng/mL (ref 0.3–4.0)
Relative Index: 3.3 — ABNORMAL HIGH (ref 0.0–2.5)
Total CK: 126 U/L (ref 7–232)
Total CK: 157 U/L (ref 7–232)
Total CK: 214 U/L (ref 7–232)
Troponin I: <0.3 ng/mL (ref <0.30)
Troponin I: <0.3 ng/mL (ref <0.30)
Troponin I: <0.3 ng/mL (ref <0.30)

## 2010-11-29 ENCOUNTER — Inpatient Hospital Stay (HOSPITAL_COMMUNITY): Payer: Medicare Other

## 2010-11-29 DIAGNOSIS — J9600 Acute respiratory failure, unspecified whether with hypoxia or hypercapnia: Secondary | ICD-10-CM

## 2010-11-29 DIAGNOSIS — J441 Chronic obstructive pulmonary disease with (acute) exacerbation: Secondary | ICD-10-CM

## 2010-11-29 DIAGNOSIS — R0902 Hypoxemia: Secondary | ICD-10-CM

## 2010-11-29 DIAGNOSIS — R7989 Other specified abnormal findings of blood chemistry: Secondary | ICD-10-CM

## 2010-11-29 LAB — DIFFERENTIAL
Basophils Absolute: 0 10*3/uL (ref 0.0–0.1)
Basophils Relative: 0 % (ref 0–1)
Eosinophils Absolute: 0 10*3/uL (ref 0.0–0.7)
Lymphocytes Relative: 4 % — ABNORMAL LOW (ref 12–46)
Monocytes Relative: 4 % (ref 3–12)
Neutro Abs: 22.1 10*3/uL — ABNORMAL HIGH (ref 1.7–7.7)
Neutrophils Relative %: 92 % — ABNORMAL HIGH (ref 43–77)

## 2010-11-29 LAB — BLOOD GAS, ARTERIAL
Bicarbonate: 28.5 mEq/L — ABNORMAL HIGH (ref 20.0–24.0)
O2 Saturation: 93.8 %
Patient temperature: 98.6
TCO2: 29.8 mmol/L (ref 0–100)
pO2, Arterial: 65.5 mmHg — ABNORMAL LOW (ref 80.0–100.0)

## 2010-11-29 LAB — CBC
HCT: 54.9 % — ABNORMAL HIGH (ref 39.0–52.0)
Hemoglobin: 18.6 g/dL — ABNORMAL HIGH (ref 13.0–17.0)
RBC: 5.75 MIL/uL (ref 4.22–5.81)

## 2010-11-29 LAB — COMPREHENSIVE METABOLIC PANEL
ALT: 62 U/L — ABNORMAL HIGH (ref 0–53)
AST: 26 U/L (ref 0–37)
Albumin: 3.5 g/dL (ref 3.5–5.2)
Alkaline Phosphatase: 85 U/L (ref 39–117)
Chloride: 101 mEq/L (ref 96–112)
Potassium: 4 mEq/L (ref 3.5–5.1)
Sodium: 141 mEq/L (ref 135–145)
Total Protein: 8.1 g/dL (ref 6.0–8.3)

## 2010-11-29 LAB — GLUCOSE, CAPILLARY

## 2010-11-30 ENCOUNTER — Ambulatory Visit: Payer: Managed Care, Other (non HMO) | Admitting: Cardiovascular Disease

## 2010-11-30 LAB — COMPREHENSIVE METABOLIC PANEL WITH GFR
ALT: 14 U/L (ref 0–53)
AST: 21 U/L (ref 0–37)
Albumin: 3.1 g/dL — ABNORMAL LOW (ref 3.5–5.2)
Alkaline Phosphatase: 79 U/L (ref 39–117)
BUN: 17 mg/dL (ref 6–23)
CO2: 30 meq/L (ref 19–32)
Calcium: 8.9 mg/dL (ref 8.4–10.5)
Chloride: 101 meq/L (ref 96–112)
Creatinine, Ser: 0.63 mg/dL (ref 0.50–1.35)
GFR calc Af Amer: >90 mL/min
GFR calc non Af Amer: >90 mL/min
Glucose, Bld: 201 mg/dL — ABNORMAL HIGH (ref 70–99)
Potassium: 3.4 meq/L — ABNORMAL LOW (ref 3.5–5.1)
Sodium: 140 meq/L (ref 135–145)
Total Bilirubin: 0.3 mg/dL (ref 0.3–1.2)
Total Protein: 6.7 g/dL (ref 6.0–8.3)

## 2010-11-30 LAB — BLOOD GAS, ARTERIAL
Acid-Base Excess: 7.2 mmol/L — ABNORMAL HIGH (ref 0.0–2.0)
Bicarbonate: 30.9 meq/L — ABNORMAL HIGH (ref 20.0–24.0)
Drawn by: 347621
FIO2: 0.21 %
O2 Saturation: 90.4 %
Patient temperature: 98.6
TCO2: 32.2 mmol/L (ref 0–100)
pCO2 arterial: 41.7 mmHg (ref 35.0–45.0)
pH, Arterial: 7.483 — ABNORMAL HIGH (ref 7.350–7.450)
pO2, Arterial: 54.6 mmHg — ABNORMAL LOW (ref 80.0–100.0)

## 2010-11-30 LAB — DIFFERENTIAL
Basophils Absolute: 0 K/uL (ref 0.0–0.1)
Basophils Relative: 0 % (ref 0–1)
Eosinophils Absolute: 0 K/uL (ref 0.0–0.7)
Eosinophils Relative: 0 % (ref 0–5)
Lymphocytes Relative: 4 % — ABNORMAL LOW (ref 12–46)
Lymphs Abs: 0.8 K/uL (ref 0.7–4.0)
Monocytes Absolute: 0.8 K/uL (ref 0.1–1.0)
Monocytes Relative: 5 % (ref 3–12)
Neutro Abs: 16.2 K/uL — ABNORMAL HIGH (ref 1.7–7.7)
Neutrophils Relative %: 91 % — ABNORMAL HIGH (ref 43–77)

## 2010-11-30 LAB — CBC
HCT: 47.8 % (ref 39.0–52.0)
Hemoglobin: 16.4 g/dL (ref 13.0–17.0)
MCH: 32.8 pg (ref 26.0–34.0)
MCHC: 34.3 g/dL (ref 30.0–36.0)
MCV: 95.6 fL (ref 78.0–100.0)
Platelets: 164 K/uL (ref 150–400)
RBC: 5 MIL/uL (ref 4.22–5.81)
RDW: 13.5 % (ref 11.5–15.5)
WBC: 17.8 K/uL — ABNORMAL HIGH (ref 4.0–10.5)

## 2010-11-30 LAB — GLUCOSE, CAPILLARY
Glucose-Capillary: 276 mg/dL — ABNORMAL HIGH (ref 70–99)
Glucose-Capillary: 127 mg/dL — ABNORMAL HIGH (ref 70–99)

## 2010-12-01 LAB — DIFFERENTIAL
Basophils Absolute: 0 10*3/uL (ref 0.0–0.1)
Lymphocytes Relative: 7 % — ABNORMAL LOW (ref 12–46)
Neutro Abs: 12.5 10*3/uL — ABNORMAL HIGH (ref 1.7–7.7)
Neutrophils Relative %: 88 % — ABNORMAL HIGH (ref 43–77)

## 2010-12-01 LAB — GLUCOSE, CAPILLARY: Glucose-Capillary: 172 mg/dL — ABNORMAL HIGH (ref 70–99)

## 2010-12-01 LAB — COMPREHENSIVE METABOLIC PANEL
ALT: 19 U/L (ref 0–53)
Albumin: 2.9 g/dL — ABNORMAL LOW (ref 3.5–5.2)
Alkaline Phosphatase: 72 U/L (ref 39–117)
BUN: 16 mg/dL (ref 6–23)
Chloride: 100 mEq/L (ref 96–112)
GFR calc Af Amer: >90 mL/min (ref >90)
Glucose, Bld: 213 mg/dL — ABNORMAL HIGH (ref 70–99)
Potassium: 3.5 mEq/L (ref 3.5–5.1)
Total Bilirubin: 0.4 mg/dL (ref 0.3–1.2)

## 2010-12-01 LAB — CBC
HCT: 49.1 % (ref 39.0–52.0)
Hemoglobin: 16.4 g/dL (ref 13.0–17.0)
WBC: 14.1 10*3/uL — ABNORMAL HIGH (ref 4.0–10.5)

## 2010-12-04 LAB — CULTURE, BLOOD (ROUTINE X 2)
Culture  Setup Time: 201210240407
Culture: NO GROWTH
Culture: NO GROWTH

## 2010-12-05 NOTE — H&P (Signed)
NAMEOLDEN, KLAUER NO.:  [id number]  MEDICAL RECORD NO.:  [id number]  LOCATION:  [facility]                         FACILITY:  [facility]  PHYSICIAN:  Perez Dirico A. Charnese Federici, M.D.   DATE OF BIRTH:  1928/05/18  DATE OF ADMISSION:  11/27/2010 DATE OF DISCHARGE:                             HISTORY & PHYSICAL   CHIEF COMPLAINT:  Altered mental status.  HISTORY OF PRESENT ILLNESS:  Taylor [name] is an 77 year old gentleman with multiple medical problems as listed below.  He has been having back issues and he was seen by Dr. Barnett Abu in his office yesterday.  His daughter reports that for the last 2-3 days, he has had increased clear mucus productive cough.  He may have had a temperature to 102 earlier today.  Of note, he started having some increased lethargy yesterday evening where he was dozing off and mumbling.  In earlier this morning, it was noted that half of his sentences were unintelligible.  He denies any pain.  There has been no blood from above or below.  There has been no chest pains.  Due to his altered mental status, he presented to the emergency room where he was found to have an elevated white blood count and an emphysema with bronchitic changes on chest x-ray.  A blood gas was performed which shows significant retention of carbon dioxide with a pCO2 of 67 and a pO2 of 61.  He will therefore be admitted for further care for hypoxic hypercapnic respiratory failure and COPD exacerbation.  PAST MEDICAL HISTORY: 1. Graves disease with radioactive iodine in 1992 with subsequent     hypothyroidism. 2. Hypertension. 3. Impaired glucose tolerance. 4. Atrial flutter. 5. Coronary artery disease. 6. Moderate bilateral carotid artery disease. 7. Hyperlipidemia. 8. Hypogonadism. 9. Depression. 10.Nephrolithiasis. 11.Vitamin D deficiency. 12.Hyperlipidemia. 13.DVT of the left leg in 2005. 14.Bladder cancer in 2005. 15.A 2.5 cm abdominal aortic  aneurysm. 16.Glaucoma. 17.Pulmonary nodule. 18.Polycythemia. 19.Sleep apnea with nocturnal oxygen. 20.COPD. 21.Benign adrenal mass 4.4 cm previous BCG treatment for bladder     cancer.  He has also had extensive skin cancer surgery to the face     with grafts, patellar fracture, varicose vein surgery, bilateral     cataracts removed, herniated nucleus polyposis in 1996, rotator     cuff surgery in 2002, and lumbar surgery in 2006.  ALLERGIES: 1. NYSTATIN. 2. CHANTIX.  MEDICATIONS: 1. MiraLax 17 g daily as needed. 2. Clotrimazole topical cream apply daily as needed. 3. Aspirin 81 mg daily. 4. Synthroid 100 mcg one-half tablet on Sunday and Monday and 1 tab     all other days. 5. Cardia XT 180 mg 1 tab daily. 6. Flonase nasal spray 2 sprays each nostril daily. 7. Xalatan 0.005% ophthalmic eye drop one drop both eyes daily. 8. Lipitor one-half of a 20 mg pill daily. 9. Nefazodone 50 mg 1.5 tablets daily. 10.Xanax 0.5 mg 1 tablet daily. 11.Tramadol 50 mg 4 times daily as needed. 12.Omeprazole 20 mg twice daily.  He also may have a nebulizer     machine, but the family says he does not use this regularly.  SOCIAL HISTORY:  He is widowed, retired in 1992.  He  is a current smoker of 1 pack per day.  His daughter is at the bedside.  FAMILY HISTORY:  Father had coronary disease.  Diabetes in a brother. Mother had lung cancer and a brother has had thyroid problems.  REVIEW OF SYSTEMS:  As per the history of present illness.  PHYSICAL EXAMINATION:  VITAL SIGNS:  Current temperature 99.6, blood pressure 113/54, pulse 91, respiratory 22, and 93% saturation on BiPAP with oxygen bleed bleeding in. GENERAL:  He is lying semi-supine, he is in no acute distress.  He has some increased respiratory rate.  He is oriented to place and year. NECK:  There is no JVD. LUNGS:  Reveal decreased breath sounds bilaterally, anterolaterally. HEART:  Has distant S1, S2.  No significant murmur, rub, or  gallop. ABDOMEN:  Soft, nontender, nondistended with no mass or hepatosplenomegaly. EXTREMITIES:  He moves extremities x4.  There is no peripheral edema. Sensation is grossly intact.  LABORATORY DATA:  Chest x-ray shows emphysematous and bronchitic changes stable 7 mm left upper lobe nodule since 2009, question left nipple shadow, recommend PA chest radiograph with nipple markers to exclude pulmonary nodule.  CT scan of the head done today shows atrophy and chronic ischemic white matter changes.  No definite acute intracranial abnormality or territorial ischemia, small lacunar infarcts or age indeterminate in the absence of comparison.  PH is 7.31, pCO2 of 67.5, pO2 of 61, bicarb 34, and O2 saturation 88% and it is unclear what level of oxygen he was receiving at the time of that blood gas.  White count 16000 with 81% segs, 8% lymphocytes, 10% monocytes, hemoglobin 18.0, and platelet count is clumped, but appears decreased. Urine shows rare squamous cells, granular casts, 0-2 white cells, 0-2 red cells, mucus present, rare bacteria.  Urine dipstick shows 100 mg/dL of protein, but is otherwise negative.  Sodium is 141, potassium 4.5, chloride 99, CO2 of 35, BUN 11, creatinine 0.70, glucose 134, and GFR greater than 90.  Total bili 0.5, alk phos 115, AST 17, ALT 9, total protein 8.0, albumin 3.8, and calcium 9.6.  EKG shows sinus rhythm with PVCs, but no hyperacute or acute ST or T-wave changes.  ASSESSMENT AND PLAN:  An 77 year old gentleman with underlying chronic obstructive pulmonary disease and ongoing smoking with acute hypoxic and hypercapnic respiratory failure.  We will admit him to the Intensive Care Unit.  We will treat him with BiPAP.  We will give him IV Solu- Medrol as well as frequent nebulizer treatments.  We will cover him with Rocephin and azithromycin.  We will continue his other home medications to the best of our ability.  We will add a procalcitonin level as  well as a TSH, magnesium, phosphorus, and BNP to his blood work.  We will ask for a pulmonology consultation as well.  He is a full code status.  His current status is guarded.     Taylor [name], M.D.     MAP/MEDQ  D:  11/27/2010  T:  11/27/2010  Job:  [id number]  Electronically Signed by Rodrigo Ran M.D. on 12/05/2010 09:27:30 AM

## 2010-12-05 NOTE — Discharge Summary (Signed)
[name], [name] NO.:  [id number]  MEDICAL RECORD NO.:  [id number]  LOCATION:  2603                         FACILITY:  [facility]  PHYSICIAN:  Taylor [name], M.D. DATE OF BIRTH:  1928/08/04  DATE OF ADMISSION:  11/27/2010 DATE OF DISCHARGE:  12/01/2010                              DISCHARGE SUMMARY   DISCHARGE DIAGNOSES: 1. Hypercarbic respiratory failure requiring BiPAP, clinically     improved. 2. Severe chronic obstructive pulmonary disease with ongoing tobacco     habituation. 3. Type 2 diabetes with elevated blood sugars due to steroids. 4. History of hypothyroidism after Graves disease. 5. Hypertension with reasonable sugars. 6. History atrial flutter without flares. 7. Carotid artery disease. 8. Hyperlipidemia. 9. Hypogonadism, untreated. 10.Depression, stable. 11.Nephrolithiasis, stable. 12.Vitamin D deficiency. 13.Prior deep venous thrombosis, 7 years ago. 14.Prior bladder cancer. 15.Small abdominal aortic aneurysm. 16.Glaucoma, under therapy. 17.Polycythemia. 18.Sleep apnea with prior nocturnal oxygen. 19.Benign adrenal mass.  CONSULTATIONS:  Critical Care Medicine.  PROCEDURES:  BiPAP, additionally had a CT of the head at presentation.  HISTORY OF PRESENT ILLNESS:  Taylor [name] is a 77 year old white male, longstanding patient of my practice.  He presented to my partner, Dr. Waynard [name], with altered mental status and respiratory failure.  His white count was elevated and he has some bronchitic changes.  He had hypercarbic respiratory failure and had to have BiPAP for about 20 hours.  He weaned off this fairly quickly.  Blood sugars have been up some with the steroids, for now reducing those down.  He is breathing better.  He is now on continuous oxygen.  He previously used only nocturnal oxygen for sleep apnea.  His thyroid is a bit over replaced while he was here.  He had no significant ventricular ectopy with no runs noted.  His  polycythemia has done well.  He is back eating, he is getting some strength, and feeling somewhat better.  He has had no significant bronchospastic episodes in the last 24 hours.  LABORATORY DATA:  Chest x-ray on November 29, 2010 showed no acute finding.  On November 28, 2010, showed interstitial edema pattern and CT of the head at presentation revealed small lacunar infarcts of age indeterminate.  His presenting chest x-ray showed enlargement of the cardiac silhouette, slight pulmonary vascular congestion, emphysematous and bronchitic changes.  No infiltrate, effusion, or pneumothorax. Demineralization of the bone was noted and the 7-mm upper lobe nodule was unchanged.  Chemistries this morning, sodium 139, potassium 3.5, chloride 100, CO2 30, BUN 16, creatinine 0.62, glucose 213, and estimated GFR greater than 98.  His AST is 20, his ALT is 19, and his albumin is slightly low at 2.9.  This morning, his white count is 14,500, hemoglobin 16.1, and platelets 167,000.  Blood gas on room air yesterday, pH 7.483, pCO2 of 42, PO2 of 54.6.  At presentation, his chemistries show sodium 141, potassium 4.5, chloride 99, CO2 35, BUN 11, creatinine 0.70, glucose of 134, and estimated GFR greater than 90.  Urinalysis was basically negative.  His initial white count was 16,000, initially hemoglobin was 18, platelets were diminished with some clumping noted.  Blood gas on presentation, pH 7.313, pCO2 of 67.5,  PO2 of 61, magnesium was 2, and phosphorus of 4.6.  Procalcitonin was less than 0.10.  ProBNP was 312. MRSA PCR screening was negative.  TSH was slightly low at 0.282.  DISCHARGE SUMMARY:  In summary, we have a smoker with known severe lung disease presenting with hypercarbic respiratory failure.  He is clinically improved.  He was treated for possibility of superimposed infection.  Overall, he is much better but now needs to go home on continuous 2 L oxygen.  This is different than what he was  doing before, was just using at night.  MEDICATIONS: 1. Albuterol nebulizers as needed. 2. Flora-Q while he is on antibiotics. 3. Avelox for 5 more days. 4. Prednisone to take 10 for 5 days, 5 for 5 days, and 5 every other     day and then stop. 5. Aspirin 81 daily. 6. Diltiazem 180 XT daily. 7. Flonase as before. 8. Lipitor 10 mg daily. 9. MiraLax as needed. 10.Nefazodone 75 mg daily. 11.Omeprazole 20. 12.Synthroid, he takes half on Sunday and Monday and a tablet every     other day. 13.Tramadol 50 as needed. 14.Xalatan eye drops as before. 15.Xanax 0.5 daily as needed. 16.Topical clotrimazole.  DISCHARGE INSTRUCTIONS:  He is to see me in 1-2 weeks.  He has follow up with the pulmonologist.  We discussed smoking cessation issues.  He had suicidal ideation to a serious degree on Chantix, although he did discuss smoking.  We talked about electronic cigarettes that probably work best for him.  We talked about safety of using oxygen at home.  His daughter and granddaughter were in the room.  We will continue the antibiotics as noted.  He is to call if he has fever, productive cough, worsening shortness of breath.  He is to watch his diet as before which is no concentrated sweets.  He is to increase his activity slowly and pain management as above.         ______________________________ Taylor [name], M.D.    SAS/MEDQ  D:  12/01/2010  T:  12/01/2010  Job:  [id number]  Electronically Signed by Taylor [name] M.D. on 12/05/2010 04:10:23 PM

## 2011-01-14 ENCOUNTER — Encounter: Payer: Self-pay | Admitting: Pulmonary Disease

## 2011-01-14 ENCOUNTER — Ambulatory Visit (INDEPENDENT_AMBULATORY_CARE_PROVIDER_SITE_OTHER): Payer: Medicare Other | Admitting: Pulmonary Disease

## 2011-01-14 DIAGNOSIS — E559 Vitamin D deficiency, unspecified: Secondary | ICD-10-CM | POA: Insufficient documentation

## 2011-01-14 DIAGNOSIS — E039 Hypothyroidism, unspecified: Secondary | ICD-10-CM | POA: Insufficient documentation

## 2011-01-14 DIAGNOSIS — R600 Localized edema: Secondary | ICD-10-CM | POA: Insufficient documentation

## 2011-01-14 DIAGNOSIS — E119 Type 2 diabetes mellitus without complications: Secondary | ICD-10-CM | POA: Insufficient documentation

## 2011-01-14 DIAGNOSIS — I10 Essential (primary) hypertension: Secondary | ICD-10-CM

## 2011-01-14 DIAGNOSIS — Z8551 Personal history of malignant neoplasm of bladder: Secondary | ICD-10-CM | POA: Insufficient documentation

## 2011-01-14 DIAGNOSIS — J449 Chronic obstructive pulmonary disease, unspecified: Secondary | ICD-10-CM | POA: Insufficient documentation

## 2011-01-14 DIAGNOSIS — Z8659 Personal history of other mental and behavioral disorders: Secondary | ICD-10-CM | POA: Insufficient documentation

## 2011-01-14 DIAGNOSIS — E785 Hyperlipidemia, unspecified: Secondary | ICD-10-CM

## 2011-01-14 DIAGNOSIS — R609 Edema, unspecified: Secondary | ICD-10-CM | POA: Insufficient documentation

## 2011-01-14 DIAGNOSIS — Z8679 Personal history of other diseases of the circulatory system: Secondary | ICD-10-CM

## 2011-01-14 DIAGNOSIS — G4733 Obstructive sleep apnea (adult) (pediatric): Secondary | ICD-10-CM

## 2011-01-14 NOTE — Progress Notes (Signed)
Subjective:    Patient ID: Taylor [name], male    DOB: [birth year], 77 y.o.   MRN: [id number]  HPI 77 y/o male with spinal stenosis, COPD, DM2, and a fib who comes to our clinic to establish care for his COPD.  He was recently admitted to [facility] in 11/2010 for a COPD exacerbation requiring bipap and a stay in the MICU for 24 hours.  He was hospitalized for 5 days total and sent home on albuterol, prednisone taper, and antibiotics.  He states that his breathing is dramatically better.  He never really gets short of breath, doesn't wheeze, and doesn't cough.  It sounds as if he never really exerts himself much because of leg and back pain.  He was scheduled to undergo back surgery in November of 2012 but because of his hospitalization for COPD this was postponed.  He says that he has used oxygen for at least 4 years and a/a nebs for 6 years, but he isn't really sure why he was told that he needed these.   He has noticed increasing swelling in his legs recently and has been taking some furosemide given to him by his PCP.  He says that this has helped some. Most importantly, he states that he has quit smoking since the hospitalization and is now smoke free for 49 days.  Past Medical History  Diagnosis Date  . Hypertension   . Hyperlipidemia   . Hypothyroidism   . COPD (chronic obstructive pulmonary disease)   . Atrial fibrillation   . Glaucoma   . Anxiety   . History of bladder cancer   . Lung nodule     SMALL  . OSA (obstructive sleep apnea)      Family History  Problem Relation Age of Onset  . Lung cancer      was never a smoker     History   Social History  . Marital Status: Divorced    Spouse Name: N/A    Number of Children: 1  . Years of Education: N/A   Occupational History  . Retired     worked at a [employer] fund[employer]   Social History Main Topics  . Smoking status: Former Smoker -- 1.5 packs/day for 60 years    Types: Cigarettes    Quit date: 11/27/2010  .  Smokeless tobacco: Never Used  . Alcohol Use: No  . Drug Use: No  . Sexually Active: Not on file   Other Topics Concern  . Not on file   Social History Narrative  . No narrative on file     Allergies  Allergen Reactions  . Chantix (Varenicline Tartrate)     depression     Outpatient Prescriptions Prior to Visit  Medication Sig Dispense Refill  . ALPRAZolam (XANAX) 0.25 MG tablet Take 0.25 mg by mouth at bedtime as needed.        Marland Kitchen aspirin 81 MG tablet Take 81 mg by mouth daily.        Marland Kitchen atorvastatin (LIPITOR) 10 MG tablet Take 10 mg by mouth daily.        Marland Kitchen diltiazem (CARDIZEM CD) 180 MG 24 hr capsule Take 180 mg by mouth daily.        . fluticasone (FLONASE) 50 MCG/ACT nasal spray Place 2 sprays into the nose daily.        Marland Kitchen latanoprost (XALATAN) 0.005 % ophthalmic solution 1 drop at bedtime.        Marland Kitchen levothyroxine (SYNTHROID, LEVOTHROID) 100  MCG tablet Take 100 mcg by mouth as directed.        . Nefazodone HCl (SERZONE PO) Take 75 mg by mouth daily.        Marland Kitchen omeprazole (PRILOSEC) 20 MG capsule Take 20 mg by mouth daily.        . OXYGEN-HELIUM IN Inhale 2 L into the lungs. As directed      . traMADol (ULTRAM) 50 MG tablet Take 50 mg by mouth every 6 (six) hours as needed.          Review of Systems  Constitutional: Positive for activity change. Negative for fever, chills, appetite change and unexpected weight change.  HENT: Negative for congestion, sore throat, rhinorrhea, sneezing, trouble swallowing, dental problem, voice change and postnasal drip.   Eyes: Negative for visual disturbance.  Respiratory: Negative for cough, choking and shortness of breath.   Cardiovascular: Positive for leg swelling. Negative for chest pain.  Gastrointestinal: Negative for nausea, vomiting and abdominal pain.  Genitourinary: Negative for difficulty urinating.  Musculoskeletal: Positive for arthralgias.  Skin: Negative for rash.  Psychiatric/Behavioral: Negative for behavioral problems and  confusion.       Objective:   Physical Exam Filed Vitals:   01/14/11 1402  BP: 112/60  Pulse: 70  Temp: 98.4 F (36.9 C)  TempSrc: Oral  Height: 5\' 7"  (1.702 m)  Weight: 217 lb (98.431 kg)  SpO2: 94%    Gen: chronically ill appearing, no acute distress HEENT: NCAT, PERRL, EOMi, OP clear, neck supple without masses PULM: Insp crackles 1/2 way up bilaterally, minimal wheeze, good air movement.  CV: RRR, slight systolic murmur, no JVD AB: BS+, soft, nontender, no hsm Ext: warm, pitting edema to mid shin bilaterally, no clubbing, no cyanosis Derm: no rash or skin breakdown Neuro: A&Ox4, CN II-XII intact, strength 5/5 in all 4 extremities   01/14/11 Spirometry in clinic: Ratio 70%, FEV1 1.64 L, Flow volume loop consistent with obstruction  11/2010 CXR's reviewed personally by me     Assessment & Plan:   COPD, severe COPD: GOLD Stage II Combined recommendations from the [employer], [employer], Designer, television/film set, European Respiratory Society (Qaseem A et al, Ann Intern Med. 2011;155(3):179) recommends tobacco cessation, pulmonary rehab (for symptomatic patients with an FEV1 < 50% predicted), supplemental oxygen (for patients with SaO2 <88% or paO2 <55), and appropriate bronchodilator therapy.  In regards to long acting bronchodilators, they recommend monotherapy (FEV1 60-80% with symptoms weak evidence, FEV1 with symptoms <60% strong evidence), or combination therapy (FEV1 <60% with symptoms, strong recommendation, moderate evidence).  One should also provide patients with annual immunizations and consider therapy for prevention of COPD exacerbations (ie. roflumilast or azithromycin) when appopriate.  -O2 therapy: checked today in office, continue 2.5L/min continuous -Immunizations: up to date -Tobacco use: quit smoking 49 days ago -Exercise: needs to have back surgery so he can exercise regularly, discussed at length  today -Bronchodilator therapy: add spiriva, continue home albuterol neb bid -Exacerbation prevention: start with spiriva, consider adding roflumilast next visit  Peripheral edema Findings of peripheral edema and bilateral crackles are worrisome for heart failure.  His lack of symptoms suggest that he is compensated well, but I have suggested that he follow up with his cardiologist regarding this soon.  He will call for an appointment.    Updated Medication List Outpatient Encounter Prescriptions as of 01/14/2011  Medication Sig Dispense Refill  . ALPRAZolam (XANAX) 0.25 MG tablet Take 0.25 mg by mouth at bedtime  as needed.        Marland Kitchen aspirin 81 MG tablet Take 81 mg by mouth daily.        Marland Kitchen atorvastatin (LIPITOR) 10 MG tablet Take 10 mg by mouth daily.        . clotrimazole (LOTRIMIN) 1 % cream Apply 1 application topically 2 (two) times daily as needed.        . diltiazem (CARDIZEM CD) 180 MG 24 hr capsule Take 180 mg by mouth daily.        . fluticasone (FLONASE) 50 MCG/ACT nasal spray Place 2 sprays into the nose daily.        Marland Kitchen latanoprost (XALATAN) 0.005 % ophthalmic solution 1 drop at bedtime.        Marland Kitchen levothyroxine (SYNTHROID, LEVOTHROID) 100 MCG tablet Take 100 mcg by mouth as directed.        . Nefazodone HCl (SERZONE PO) Take 75 mg by mouth daily.        Marland Kitchen omeprazole (PRILOSEC) 20 MG capsule Take 20 mg by mouth daily.        . OXYGEN-HELIUM IN Inhale 2 L into the lungs. As directed      . polyethylene glycol (MIRALAX / GLYCOLAX) packet Take 17 g by mouth daily.        . traMADol (ULTRAM) 50 MG tablet Take 50 mg by mouth every 6 (six) hours as needed.

## 2011-01-14 NOTE — Assessment & Plan Note (Signed)
Findings of peripheral edema and bilateral crackles are worrisome for heart failure.  His lack of symptoms suggest that he is compensated well, but I have suggested that he follow up with his cardiologist regarding this soon.  He will call for an appointment.

## 2011-01-14 NOTE — Patient Instructions (Signed)
Use spiriva once a day for your COPD. Use the albuterol nebulizer as needed for shortness of breath. We recommend that you have your back surgery with Dr. Rochele Pages so that you can exercise as much as possible. We recommend that you see your Cardiologist Dr. Macon Large as soon as possible to discuss whether or not you have heart failure.  We will see you back in 3 months.

## 2011-01-14 NOTE — Assessment & Plan Note (Signed)
COPD: GOLD Stage II Combined recommendations from the [employer]	[place], [employer], Designer, television/film set, European Respiratory Society (Qaseem A et al, Ann Intern Med. 2011;155(3):179) recommends tobacco cessation, pulmonary rehab (for symptomatic patients with an FEV1 < 50% predicted), supplemental oxygen (for patients with SaO2 <88% or paO2 <55), and appropriate bronchodilator therapy.  In regards to long acting bronchodilators, they recommend monotherapy (FEV1 60-80% with symptoms weak evidence, FEV1 with symptoms <60% strong evidence), or combination therapy (FEV1 <60% with symptoms, strong recommendation, moderate evidence).  One should also provide patients with annual immunizations and consider therapy for prevention of COPD exacerbations (ie. roflumilast or azithromycin) when appopriate.  -O2 therapy: checked today in office, continue 2.5L/min continuous -Immunizations: up to date -Tobacco use: quit smoking 49 days ago -Exercise: needs to have back surgery so he can exercise regularly, discussed at length today -Bronchodilator therapy: add spiriva, continue home albuterol neb bid -Exacerbation prevention: start with spiriva, consider adding roflumilast next visit

## 2011-03-08 ENCOUNTER — Encounter: Payer: Self-pay | Admitting: Cardiovascular Disease

## 2011-03-08 ENCOUNTER — Ambulatory Visit (INDEPENDENT_AMBULATORY_CARE_PROVIDER_SITE_OTHER): Payer: Medicare Other | Admitting: Cardiovascular Disease

## 2011-03-08 VITALS — BP 130/78 | HR 58 | Ht 67.0 in | Wt 218.0 lb

## 2011-03-08 DIAGNOSIS — I4892 Unspecified atrial flutter: Secondary | ICD-10-CM

## 2011-03-08 DIAGNOSIS — R0609 Other forms of dyspnea: Secondary | ICD-10-CM

## 2011-03-08 DIAGNOSIS — J449 Chronic obstructive pulmonary disease, unspecified: Secondary | ICD-10-CM

## 2011-03-08 DIAGNOSIS — R0600 Dyspnea, unspecified: Secondary | ICD-10-CM

## 2011-03-08 NOTE — Assessment & Plan Note (Signed)
Taylor [name] presents today for preoperative evaluation prior to having back surgery. I've seen him in the past but we are not able to locate his chart at this time. He's had a stress test in the past which was normal. He's had significant dyspnea which we have thought is due to his severe COPD.  We'll get an echocardiogram. If his echocardiogram shows normal left systolic function, then he should be at relatively low risk from a cardiac standpoint for his back surgery.  He's never had any episodes of angina and I do not think that we need a stress test at this time since she's not had any previous episodes of ischemia. He stopped smoking several months ago.

## 2011-03-08 NOTE — Progress Notes (Signed)
Taylor [name] Date of Birth  1928-05-09 [facility]     [facility]  [address]    [address]   [address] [place], [place]  [address]    [place], [place]  [address] [phone]  Fax  [phone]  [phone]  Fax [phone]   Problem List: 1.  COPD 2.  Spinal Stenosis 3.      History of Present Illness:  Taylor [name] is an 78 yo gentleman who I have seen in the past for dyspnea.  He has severe COPD.  No episodes of chest pain.  He was hospitalized in September 2012 with respiratory failure and COPD exacerbation.  He has stopped smoking as of that hospitalization.  He has had a Adenosine Myoview that was unremarkable.    Current Outpatient Prescriptions on File Prior to Visit  Medication Sig Dispense Refill  . ALPRAZolam (XANAX) 0.25 MG tablet Take 0.25 mg by mouth at bedtime as needed.        Marland Kitchen aspirin 81 MG tablet Take 81 mg by mouth daily.        Marland Kitchen atorvastatin (LIPITOR) 10 MG tablet Take 10 mg by mouth daily.        . clotrimazole (LOTRIMIN) 1 % cream Apply 1 application topically 2 (two) times daily as needed.        . diltiazem (CARDIZEM CD) 180 MG 24 hr capsule Take 180 mg by mouth daily.        . fluticasone (FLONASE) 50 MCG/ACT nasal spray Place 2 sprays into the nose daily.        Marland Kitchen latanoprost (XALATAN) 0.005 % ophthalmic solution 1 drop at bedtime.        Marland Kitchen levothyroxine (SYNTHROID, LEVOTHROID) 100 MCG tablet Take 100 mcg by mouth as directed.        . Nefazodone HCl (SERZONE PO) Take 75 mg by mouth daily.        Marland Kitchen omeprazole (PRILOSEC) 20 MG capsule Take 20 mg by mouth daily.        . OXYGEN-HELIUM IN Inhale 2 L into the lungs. As directed      . polyethylene glycol (MIRALAX / GLYCOLAX) packet Take 17 g by mouth daily.        . traMADol (ULTRAM) 50 MG tablet Take 50 mg by mouth every 6 (six) hours as needed.          Allergies  Allergen Reactions  . Chantix (Varenicline Tartrate)     depression    Past Medical History  Diagnosis  Date  . Hypertension   . Hyperlipidemia   . Hypothyroidism   . COPD (chronic obstructive pulmonary disease)   . Atrial fibrillation   . Glaucoma   . Anxiety   . History of bladder cancer   . Lung nodule     SMALL  . OSA (obstructive sleep apnea)     Past Surgical History  Procedure Date  . Transurethral resection of prostate   . Cardiovascular stress test 12/25/2007    EF 64%. NO EVIDENCE OF ISCHEMIA    History  Smoking status  . Former Smoker -- 1.5 packs/day for 60 years  . Types: Cigarettes  . Quit date: 11/27/2010  Smokeless tobacco  . Never Used    History  Alcohol Use No    Family History  Problem Relation Age of Onset  . Lung cancer      was never a smoker    Reviw of Systems:  Reviewed in  the HPI.  All other systems are negative.  Physical Exam: Blood pressure 130/78, pulse 58, height 5\' 7"  (1.702 m), weight 218 lb (98.884 kg). General: Well developed, well nourished, in no acute distress.  Head: He's had extensive facial surgery. He's had part of his nose removed and the tissue graft from his forehead.   sclera non-icteric, mucus membranes are moist,   Neck: Supple. Negative for carotid bruits. JVD not elevated.  Lungs: Clear bilaterally to auscultation without wheezes, rales, or rhonchi. Breathing is unlabored.  Heart: RRR with S1 S2. No murmurs, rubs, or gallops appreciated.  Abdomen: Soft, non-tender, non-distended with normoactive bowel sounds. No hepatomegaly. No rebound/guarding. No obvious abdominal masses.  Msk:  Strength and tone appear normal for age.  Extremities: No clubbing or cyanosis. No edema.  Distal pedal pulses are 2+ and equal bilaterally.  Neuro: Alert and oriented X 3. Moves all extremities spontaneously.  Psych:  Responds to questions appropriately with a normal affect.  ECG: Sinus bradycardia with occasional premature ventricular contractions  Assessment / Plan:

## 2011-03-08 NOTE — Patient Instructions (Signed)
Your physician wants you to follow-up in: 1 year.   You will receive a reminder letter in the mail two months in advance. If you don't receive a letter, please call our office to schedule the follow-up appointment.  Your physician has requested that you have an echocardiogram. Echocardiography is a painless test that uses sound waves to create images of your heart. It provides your doctor with information about the size and shape of your heart and how well your heart's chambers and valves are working. This procedure takes approximately one hour. There are no restrictions for this procedure.  Your physician recommends that you continue on your current medications as directed. Please refer to the Current Medication list given to you today.  

## 2011-03-15 ENCOUNTER — Ambulatory Visit (HOSPITAL_COMMUNITY): Payer: Medicare Other | Attending: Cardiology | Admitting: Radiology

## 2011-03-15 DIAGNOSIS — R0602 Shortness of breath: Secondary | ICD-10-CM

## 2011-03-15 DIAGNOSIS — R0989 Other specified symptoms and signs involving the circulatory and respiratory systems: Secondary | ICD-10-CM | POA: Insufficient documentation

## 2011-03-15 DIAGNOSIS — J4489 Other specified chronic obstructive pulmonary disease: Secondary | ICD-10-CM | POA: Insufficient documentation

## 2011-03-15 DIAGNOSIS — I4892 Unspecified atrial flutter: Secondary | ICD-10-CM

## 2011-03-15 DIAGNOSIS — E785 Hyperlipidemia, unspecified: Secondary | ICD-10-CM | POA: Insufficient documentation

## 2011-03-15 DIAGNOSIS — R0609 Other forms of dyspnea: Secondary | ICD-10-CM | POA: Insufficient documentation

## 2011-03-15 DIAGNOSIS — I4891 Unspecified atrial fibrillation: Secondary | ICD-10-CM | POA: Insufficient documentation

## 2011-03-15 DIAGNOSIS — J449 Chronic obstructive pulmonary disease, unspecified: Secondary | ICD-10-CM | POA: Insufficient documentation

## 2011-03-15 DIAGNOSIS — I10 Essential (primary) hypertension: Secondary | ICD-10-CM | POA: Insufficient documentation

## 2011-03-15 DIAGNOSIS — R0600 Dyspnea, unspecified: Secondary | ICD-10-CM

## 2011-04-09 ENCOUNTER — Ambulatory Visit (INDEPENDENT_AMBULATORY_CARE_PROVIDER_SITE_OTHER): Payer: Medicare Other | Admitting: Pulmonary Disease

## 2011-04-09 ENCOUNTER — Encounter: Payer: Self-pay | Admitting: Pulmonary Disease

## 2011-04-09 DIAGNOSIS — R0902 Hypoxemia: Secondary | ICD-10-CM

## 2011-04-09 DIAGNOSIS — J449 Chronic obstructive pulmonary disease, unspecified: Secondary | ICD-10-CM

## 2011-04-09 MED ORDER — ARFORMOTEROL TARTRATE 15 MCG/2ML IN NEBU: INHALATION_SOLUTION | RESPIRATORY_TRACT | Status: DC

## 2011-04-09 NOTE — Assessment & Plan Note (Signed)
GOLD stage II COPD.  This has been a stable interval.  I explained to him that he should be on a long acting bronchodilator to improve his shortness of breath, reduce the chances of an exacerbation and improve lung function.  He did not think that tiotropium helped and doesn't think he can afford it.  He is willing to try something else.  Plan: -start brovana nebulized bid (he has a nebulizer and this should be easy to use) -continue albuterol as needed -continue O2 2.5 L with exertion -he is low to moderate risk of having a perioperative pulmonary complication but I think that he needs the back surgery so he can exercise more.  If he goes for surgery then while in hospital he needs to use the brovana twice a day, albuterol every four hours, and have incentive spirometry. -continue to stay off cigarettes as you are doing.

## 2011-04-09 NOTE — Assessment & Plan Note (Signed)
Chronic, due to COPD. Continue to use 2.5 L with exertion and with CPAP at night.

## 2011-04-09 NOTE — Patient Instructions (Signed)
Continue using the oxygen at 2.5 L minute with exertion.  You do not need to use it at rest. Start taking brovana (nebulizer) twice a day. This is to be taken twice a day no matter how you feel. We feel you should be evaluated for back surgery.  If you need it, then you should continue to use brovana twice a day and albuterol every four hours while in the hospital.  We will see you back in 4 months.

## 2011-04-09 NOTE — Progress Notes (Signed)
Subjective:    Patient ID: Taylor [name], male    DOB: [birth year], 78 y.o.   MRN: [id number]  Synopsis: Taylor [name] is a 78 y/o male with COPD (GOLD stage II by spirometry) who has a prior episode of exacerbation requiring BIPAP and an ICU stay who presented to the [facility] clinic for further evaluation in 12/2010.    HPI 04/09/11 ROV --Taylor [name] states that he is doing well.  He has not had increased shortness of breath and has a rare cough. He does not exercise.  He took the tiotropium but doesn't think that it helped his breathing at all.  He wants to have the back surgery as soon as possible.    Review of Systems Gen: some weight gain noted, no fevers or chills CV: denies chest pain, palpitations, increase in swelling    Objective:   Physical Exam  Filed Vitals:   04/09/11 1425  BP: 120/80  Pulse: 63  Temp: 97.5 F (36.4 C)  TempSrc: Oral  Height: 5\' 7"  (1.702 m)  Weight: 221 lb (100.245 kg)  SpO2: 96%   Gen: chronically ill appearing obese white male in no acute distress HEENT: NCAT, PERRL, EOMi, OP clear, neck supple without masses PULM: Few insp crackles in bases, no wheeze CV: RRR, no mgr, no JVD AB: BS+, soft, nontender, no hsm Ext: warm, no edema, no clubbing, no cyanosis Derm: no rash or skin breakdown Neuro: A&Ox4, CN II-XII intact, strength 5/5 in all 4 extremities  He walked 350 feet with me in clinic today on room air before his oxygen saturation dropped to 87%. He had normal oxygenation while on 2.5L [place].    Assessment & Plan:   COPD, severe GOLD stage II COPD.  This has been a stable interval.  I explained to him that he should be on a long acting bronchodilator to improve his shortness of breath, reduce the chances of an exacerbation and improve lung function.  He did not think that tiotropium helped and doesn't think he can afford it.  He is willing to try something else.  Plan: -start brovana nebulized bid (he has a nebulizer and this should be  easy to use) -continue albuterol as needed -continue O2 2.5 L with exertion -he is low to moderate risk of having a perioperative pulmonary complication but I think that he needs the back surgery so he can exercise more.  If he goes for surgery then while in hospital he needs to use the brovana twice a day, albuterol every four hours, and have incentive spirometry. -continue to stay off cigarettes as you are doing.  Hypoxemia Chronic, due to COPD. Continue to use 2.5 L with exertion and with CPAP at night.    Updated Medication List Outpatient Encounter Prescriptions as of 04/09/2011  Medication Sig Dispense Refill  . ALPRAZolam (XANAX) 0.25 MG tablet Take 0.25 mg by mouth at bedtime as needed.        Marland Kitchen aspirin 81 MG tablet Take 81 mg by mouth daily.        Marland Kitchen atorvastatin (LIPITOR) 10 MG tablet Take 10 mg by mouth daily.        . clotrimazole (LOTRIMIN) 1 % cream Apply 1 application topically 2 (two) times daily as needed.        . diltiazem (CARDIZEM CD) 180 MG 24 hr capsule Take 180 mg by mouth daily.        . fluticasone (FLONASE) 50 MCG/ACT nasal spray Place 2 sprays into  the nose daily.        Marland Kitchen latanoprost (XALATAN) 0.005 % ophthalmic solution 1 drop at bedtime.        Marland Kitchen levothyroxine (SYNTHROID, LEVOTHROID) 100 MCG tablet Take 100 mcg by mouth as directed.        . Nefazodone HCl (SERZONE PO) Take 75 mg by mouth daily.        Marland Kitchen omeprazole (PRILOSEC) 20 MG capsule Take 20 mg by mouth daily.        . OXYGEN-HELIUM IN Inhale 2.5 L into the lungs. As directed      . polyethylene glycol (MIRALAX / GLYCOLAX) packet Take 17 g by mouth daily.        . traMADol (ULTRAM) 50 MG tablet Take 50 mg by mouth every 6 (six) hours as needed.        Marland Kitchen arformoterol (BROVANA) 15 MCG/2ML NEBU 1 vial in nebulizer twice daily Dx- 496  120 mL  4

## 2011-04-10 ENCOUNTER — Telehealth: Payer: Self-pay | Admitting: Pulmonary Disease

## 2011-04-10 ENCOUNTER — Telehealth: Payer: Self-pay | Admitting: *Deleted

## 2011-04-10 ENCOUNTER — Other Ambulatory Visit: Payer: Self-pay | Admitting: *Deleted

## 2011-04-10 DIAGNOSIS — J449 Chronic obstructive pulmonary disease, unspecified: Secondary | ICD-10-CM

## 2011-04-10 MED ORDER — ARFORMOTEROL TARTRATE 15 MCG/2ML IN NEBU: INHALATION_SOLUTION | RESPIRATORY_TRACT | Status: DC

## 2011-04-10 NOTE — Telephone Encounter (Signed)
Should be completely covered by Medicare.  Please have him check. Thanks

## 2011-04-10 NOTE — Telephone Encounter (Signed)
[facility]ed [facility] spoke with pharmacist went ahead and called in the brovana rx ,Pt is aware nothing further was needed.

## 2011-04-10 NOTE — Telephone Encounter (Signed)
rx sent. Pt is aware. Doree Kuehne, CMA  

## 2011-04-10 NOTE — Telephone Encounter (Signed)
Rosalyn Gess was sent to pharmacy and pt declined due to cost. Pharmacist is asking for an alternative. Please advise, thanks.

## 2011-04-11 ENCOUNTER — Telehealth: Payer: Self-pay | Admitting: Pulmonary Disease

## 2011-04-11 NOTE — Telephone Encounter (Signed)
Duplicate msg see msg from dr Kendrick Fries from yesterday

## 2011-04-11 NOTE — Telephone Encounter (Signed)
Per 04/11/2011 phone note   Pt says rx that was given to him this week he can afford it doesn't know the name wants to know what else he can take.//[facility] [place]

## 2011-04-11 NOTE — Telephone Encounter (Signed)
I spoke with the pt and his co-pay was going to cost too much and he cannot afford to pay a lot for this medication. [facility] called [facility] [place] and the pharmacist said pt's co-pay would be $150 every month. Pt did take the medication back to the pharmacy and states he will need something cheaper. Dr. Kendrick Fries, would [facility] be an option for the patient? He could possibly get this through [facility].

## 2011-04-12 ENCOUNTER — Emergency Department: Payer: Self-pay | Admitting: Internal Medicine

## 2011-04-12 ENCOUNTER — Other Ambulatory Visit: Payer: Self-pay | Admitting: Pulmonary Disease

## 2011-04-12 ENCOUNTER — Ambulatory Visit (INDEPENDENT_AMBULATORY_CARE_PROVIDER_SITE_OTHER): Payer: Medicare Other | Admitting: Pulmonary Disease

## 2011-04-12 DIAGNOSIS — J4489 Other specified chronic obstructive pulmonary disease: Secondary | ICD-10-CM

## 2011-04-12 DIAGNOSIS — J441 Chronic obstructive pulmonary disease with (acute) exacerbation: Secondary | ICD-10-CM

## 2011-04-12 DIAGNOSIS — J449 Chronic obstructive pulmonary disease, unspecified: Secondary | ICD-10-CM

## 2011-04-12 DIAGNOSIS — R0600 Dyspnea, unspecified: Secondary | ICD-10-CM

## 2011-04-12 LAB — CBC
HGB: 13.2 g/dL (ref 13.0–18.0)
MCH: 31.7 pg (ref 26.0–34.0)
Platelet: 173 10*3/uL (ref 150–440)
RDW: 12.9 % (ref 11.5–14.5)
WBC: 13.4 10*3/uL — ABNORMAL HIGH (ref 3.8–10.6)

## 2011-04-12 LAB — COMPREHENSIVE METABOLIC PANEL
Alkaline Phosphatase: 84 U/L (ref 50–136)
BUN: 16 mg/dL (ref 7–18)
Bilirubin,Total: 0.4 mg/dL (ref 0.2–1.0)
Calcium, Total: 8.9 mg/dL (ref 8.5–10.1)
Chloride: 104 mmol/L (ref 98–107)
Creatinine: 0.97 mg/dL (ref 0.60–1.30)
EGFR (African American): >60
Glucose: 159 mg/dL — ABNORMAL HIGH (ref 65–99)
Osmolality: 291 (ref 275–301)
Potassium: 4.1 mmol/L (ref 3.5–5.1)
SGOT(AST): 23 U/L (ref 15–37)
SGPT (ALT): 18 U/L
Total Protein: 7.7 g/dL (ref 6.4–8.2)

## 2011-04-12 LAB — CK TOTAL AND CKMB (NOT AT ARMC)
CK, Total: 91 U/L (ref 35–232)
CK-MB: 1.8 ng/mL (ref 0.5–3.6)

## 2011-04-12 LAB — TROPONIN I: Troponin-I: <0.02 ng/mL

## 2011-04-12 NOTE — Patient Instructions (Signed)
Take the doxycycline as prescribed Take the prednisone taper as prescribed

## 2011-04-12 NOTE — Assessment & Plan Note (Addendum)
Taylor [name] appears to have a URI infection and now has more wheezing than when we saw him earlier this week.  He is slightly more hypoxemic which I believe is likely due to this.  He does not appear to be volume overloaded or to have pneumonia based on his symptoms or exam.  Plan: -will give prednisone burst and doxycycline for 7 days -CXR today at [facility] to ensure no pneumonia -RTC or call us if not better in 3-4 days -increase oxygen to 3 L continuously -instructed to go to ED if fever, chills, worsening shortness of breath, chest pain, fever

## 2011-04-12 NOTE — Telephone Encounter (Signed)
[facility]ist would be fine with me if he can get it.

## 2011-04-12 NOTE — Progress Notes (Signed)
Subjective:    Patient ID: Taylor [name], male    DOB: 03-27-1928, 78 y.o.   MRN: [id number]  Synopsis: Taylor [name] is a 78 y/o male with COPD (GOLD stage II by spirometry) who has a prior episode of exacerbation requiring BIPAP and an ICU stay who presented to the [facility] clinic for further evaluation in 12/2010.    HPI  04/09/11 ROV --Taylor [name] states that he is doing well.  He has not had increased shortness of breath and has a rare cough. He does not exercise.  He took the tiotropium but doesn't think that it helped his breathing at all.  He wants to have the back surgery as soon as possible.  04/12/11 Acute visit: Taylor [name] presented to our clinic today because he had noticed that his oxygen saturation had been lower and he had "a rattling" in his throat.  These symptoms started this morning.  He had a cough briefly in the morning but it had improved by the time he saw [place] in clinic, but he noted that his resting oxygen saturation was 88% so he decided to come into our clinic to be check out.  He stated that he was not more short of breath and was not having chest pain or swelling.   Review of Systems  Constitutional: Negative for fever, chills and diaphoresis.  Respiratory: Positive for cough. Negative for chest tightness, shortness of breath and wheezing.   Cardiovascular: Negative for chest pain and leg swelling.  Gastrointestinal: Negative for nausea, vomiting, abdominal pain and abdominal distention.      Objective:   Physical Exam   Filed Vitals:   04/12/11 1427  Pulse: 77  Resp: 12  SpO2: 91%   Gen: chronically ill appearing obese white male in no acute distress; speaking in complete sentences,  HEENT: NCAT, PERRL, EOMi, OP clear, neck supple without masses PULM: Insp upper airway rhonchi noted, no stridor, some exp wheeze noted throughout;  CV: RRR, no mgr, no JVD AB: BS+, soft, nontender, no hsm Ext: warm, no edema, no clubbing, no cyanosis Derm: no rash or skin  breakdown Neuro: A&Ox4, CN II-XII intact, strength 5/5 in all 4 extremities  He walked 350 feet with me in clinic today on room air before his oxygen saturation dropped to 87%. He had normal oxygenation while on 2.5L [place].    Assessment & Plan:   COPD, severe Taylor [name] appears to have a URI infection and now has more wheezing than when we saw him earlier this week.  He is slightly more hypoxemic which I believe is likely due to this.  He does not appear to be volume overloaded or to have pneumonia based on his symptoms or exam.  Plan: -will give prednisone burst and doxycycline for 7 days -CXR today at [facility] to ensure no pneumonia -RTC or call us if not better in 3-4 days -instructed to go to ED if fever, chills, worsening shortness of breath, chest pain, fever     Updated Medication List Outpatient Encounter Prescriptions as of 04/12/2011  Medication Sig Dispense Refill  . ALPRAZolam (XANAX) 0.25 MG tablet Take 0.25 mg by mouth at bedtime as needed.        Marland Kitchen arformoterol (BROVANA) 15 MCG/2ML NEBU 1 vial in nebulizer twice daily Dx- 496  120 mL  4  . aspirin 81 MG tablet Take 81 mg by mouth daily.        Marland Kitchen atorvastatin (LIPITOR) 10 MG tablet Take 10 mg by  mouth daily.        . clotrimazole (LOTRIMIN) 1 % cream Apply 1 application topically 2 (two) times daily as needed.        . diltiazem (CARDIZEM CD) 180 MG 24 hr capsule Take 180 mg by mouth daily.        . fluticasone (FLONASE) 50 MCG/ACT nasal spray Place 2 sprays into the nose daily.        Marland Kitchen latanoprost (XALATAN) 0.005 % ophthalmic solution 1 drop at bedtime.        Marland Kitchen levothyroxine (SYNTHROID, LEVOTHROID) 100 MCG tablet Take 100 mcg by mouth as directed.        . Nefazodone HCl (SERZONE PO) Take 75 mg by mouth daily.        Marland Kitchen omeprazole (PRILOSEC) 20 MG capsule Take 20 mg by mouth daily.        . OXYGEN-HELIUM IN Inhale 2.5 L into the lungs. As directed      . polyethylene glycol (MIRALAX / GLYCOLAX) packet Take 17 g by mouth  daily.        . traMADol (ULTRAM) 50 MG tablet Take 50 mg by mouth every 6 (six) hours as needed.

## 2011-04-16 MED ORDER — FORMOTEROL FUMARATE 20 MCG/2ML IN NEBU: 20.0000 ug | INHALATION_SOLUTION | Freq: Two times a day (BID) | RESPIRATORY_TRACT | Status: DC

## 2011-04-16 NOTE — Telephone Encounter (Signed)
Called and spoke with pt.  Informed him of Dr. Ulyses Jarred recs.  Pt is ok to go ahead and try getting perforomist through [facility] to see if it would be cheaper.  Order sent to [facility] and also printed off rx for them to fax to [facility].

## 2011-04-16 NOTE — Telephone Encounter (Signed)
.  LMOM for pt TCB to inform him of Dr. Ulyses Jarred recs and changing his neb med from brovanna to Perforomist.

## 2011-04-16 NOTE — Telephone Encounter (Signed)
Pt returned call

## 2011-07-30 ENCOUNTER — Telehealth: Payer: Self-pay | Admitting: Pulmonary Disease

## 2011-07-30 NOTE — Telephone Encounter (Signed)
I spoke with pt and he states he has been on performist now x about 70 days and every since he has been having a tremendous amount of cramps in his legs, feet, and ankles. This is the only other thing new he has started. Pt wanting to know if this medication could cause this. BQ is off and pt is would like an answer today if possible. Please advise SN thanks

## 2011-07-30 NOTE — Telephone Encounter (Signed)
Per Tammy D, pt will need OV.    --  Called, spoke with pt.  We have scheduled him to see Dayton Bailiff, NP on August 01, 2011 at 12 pm in [place].  He is aware of this and voiced no further questions/concerns at this time.

## 2011-08-01 ENCOUNTER — Ambulatory Visit (INDEPENDENT_AMBULATORY_CARE_PROVIDER_SITE_OTHER): Payer: Medicare Other | Admitting: Adult Health

## 2011-08-01 ENCOUNTER — Encounter: Payer: Self-pay | Admitting: Adult Health

## 2011-08-01 VITALS — BP 124/72 | HR 60 | Temp 97.1°F | Ht 67.0 in | Wt 226.8 lb

## 2011-08-01 DIAGNOSIS — IMO0001 Reserved for inherently not codable concepts without codable children: Secondary | ICD-10-CM

## 2011-08-01 DIAGNOSIS — J449 Chronic obstructive pulmonary disease, unspecified: Secondary | ICD-10-CM

## 2011-08-01 DIAGNOSIS — M791 Myalgia, unspecified site: Secondary | ICD-10-CM

## 2011-08-01 DIAGNOSIS — E785 Hyperlipidemia, unspecified: Secondary | ICD-10-CM

## 2011-08-01 MED ORDER — TIOTROPIUM BROMIDE MONOHYDRATE 18 MCG IN CAPS: 18.0000 ug | ORAL_CAPSULE | Freq: Every day | RESPIRATORY_TRACT | Status: DC

## 2011-08-01 NOTE — Progress Notes (Signed)
Subjective:    Patient ID: Taylor [name], male    DOB: 08-27-1928, 78 y.o.   MRN: [id number]  HPI Synopsis: Taylor [name] is a 78 y/o male with COPD (GOLD stage II by spirometry) who has a prior episode of exacerbation requiring BIPAP and an ICU stay who presented to the [facility] clinic for further evaluation in 12/2010.    HPI  04/09/11 ROV --Taylor [name] states that he is doing well.  He has not had increased shortness of breath and has a rare cough. He does not exercise.  He took the tiotropium but doesn't think that it helped his breathing at all.  He wants to have the back surgery as soon as possible.  04/12/11 Acute visit: Taylor [name] presented to our clinic today because he had noticed that his oxygen saturation had been lower and he had "a rattling" in his throat.  These symptoms started this morning.  He had a cough briefly in the morning but it had improved by the time he saw [place] in clinic, but he noted that his resting oxygen saturation was 88% so he decided to come into our clinic to be check out.  He stated that he was not more short of breath and was not having chest pain or swelling. >doxycycline and steroid taper    08/01/2011 Acute OV  Complains of cramps in both ankles, feet, legs, and thumbs x 6 months after starting Perforomist He is convinced that the Perforomist is causing this.  Breathing is doing well at his baseline.  Of note on statin therapy.  No chest pain or edema.      Review of Systems Constitutional:   No  weight loss, night sweats,  Fevers, chills,  +fatigue, or  lassitude.  HEENT:   No headaches,  Difficulty swallowing,  Tooth/dental problems, or  Sore throat,                No sneezing, itching, ear ache, nasal congestion, post nasal drip,   CV:  No chest pain,  Orthopnea, PND, swelling in lower extremities, anasarca, dizziness, palpitations, syncope.   GI  No heartburn, indigestion, abdominal pain, nausea, vomiting, diarrhea, change in bowel habits,  loss of appetite, bloody stools.   Resp:    No excess mucus, no productive cough,  No non-productive cough,  No coughing up of blood.  No change in color of mucus.  No wheezing.  No chest wall deformity  Skin: no rash or lesions.  GU: no dysuria, change in color of urine, no urgency or frequency.  No flank pain, no hematuria   MS:  No joint pain or swelling.  No decreased range of motion.  No back pain.  Psych:  No change in mood or affect. No depression or anxiety.  No memory loss.         Objective:   Physical Exam GEN: A/Ox3; pleasant , NAD, elderly   HEENT:  [place]/AT,  EACs-clear, TMs-wnl, NOSE-clear, THROAT-clear, no lesions, no postnasal drip or exudate noted.   NECK:  Supple w/ fair ROM; no JVD; normal carotid impulses w/o bruits; no thyromegaly or nodules palpated; no lymphadenopathy.  RESP  Coarse BS  w/o, wheezes/ rales/ or rhonchi.no accessory muscle use, no dullness to percussion  CARD:  RRR, no m/r/g  , no peripheral edema, pulses intact, no cyanosis or clubbing.  GI:   Soft & nt; nml bowel sounds; no organomegaly or masses detected.  Musco: Warm bil, no deformities or joint swelling noted.  Neuro: alert, no focal deficits noted.    Skin: Warm, no lesions or rashes         Assessment & Plan:

## 2011-08-01 NOTE — Patient Instructions (Signed)
Stop Perforomist  Begin Spiriva 1 puff daily  I will call with lab results.  Warm heat to feet and calf/hands prior to bedtime  Drink enough fluids.  Follow up Dr. Kendrick Fries in 1 month .

## 2011-08-01 NOTE — Assessment & Plan Note (Signed)
COPD with possible medication side effect/intolerance  Doubt the perforomist is causing this however pt is sure this is the culprit  Will take off briefly  Trial Spiriva daily  Check labs with bmet and CK total (on statin)  follow up Dr. Kendrick Fries in 4 weeks and As needed

## 2011-08-13 ENCOUNTER — Telehealth: Payer: Self-pay | Admitting: Cardiovascular Disease

## 2011-08-13 NOTE — Telephone Encounter (Signed)
Pt calling to see if he needs to stop taking cartia xt before having a surgical procedure

## 2011-08-13 NOTE — Telephone Encounter (Signed)
msg left that the diltiazem does not have to be stopped but needs  to call his surgeons office with further questions.

## 2011-09-16 ENCOUNTER — Ambulatory Visit: Payer: Medicare Other | Admitting: Pulmonary Disease

## 2011-10-18 HISTORY — PX: BACK SURGERY: SHX140

## 2011-11-06 ENCOUNTER — Other Ambulatory Visit: Payer: Self-pay | Admitting: Endocrinology

## 2011-11-06 DIAGNOSIS — R1012 Left upper quadrant pain: Secondary | ICD-10-CM

## 2011-11-07 ENCOUNTER — Ambulatory Visit
Admission: RE | Admit: 2011-11-07 | Discharge: 2011-11-07 | Disposition: A | Discharge status: Home / Self Care | Payer: Medicare Other | Source: Ambulatory Visit | Attending: Endocrinology | Admitting: Endocrinology

## 2011-11-07 DIAGNOSIS — R1012 Left upper quadrant pain: Secondary | ICD-10-CM

## 2011-12-10 ENCOUNTER — Ambulatory Visit (INDEPENDENT_AMBULATORY_CARE_PROVIDER_SITE_OTHER): Payer: Medicare Other | Admitting: Pulmonary Disease

## 2011-12-10 ENCOUNTER — Telehealth: Payer: Self-pay | Admitting: Pulmonary Disease

## 2011-12-10 ENCOUNTER — Encounter: Payer: Self-pay | Admitting: Pulmonary Disease

## 2011-12-10 VITALS — BP 130/66 | HR 86 | Temp 97.9°F | Ht 67.0 in | Wt 227.4 lb

## 2011-12-10 DIAGNOSIS — R0902 Hypoxemia: Secondary | ICD-10-CM

## 2011-12-10 DIAGNOSIS — J449 Chronic obstructive pulmonary disease, unspecified: Secondary | ICD-10-CM

## 2011-12-10 MED ORDER — FORMOTEROL FUMARATE 20 MCG/2ML IN NEBU: 20.0000 ug | INHALATION_SOLUTION | Freq: Two times a day (BID) | RESPIRATORY_TRACT | Status: DC

## 2011-12-10 NOTE — Patient Instructions (Signed)
Keep taking your medications as written Keep using the performist as you are doing Keep using your oxygen as written Exercise as much as possible and try to lose weight (20-30 lbs ideally) We will see you back in 6 months or sooner if needed

## 2011-12-10 NOTE — Telephone Encounter (Signed)
This is what he asked [place] to prescribe.  Can he get it through a home health agency?

## 2011-12-10 NOTE — Assessment & Plan Note (Signed)
Continue 2.5 L with exertion 

## 2011-12-10 NOTE — Telephone Encounter (Signed)
I spoke with pt and he stated the performists neb will cost him $292. He stated he can't afford this. He needs an alternative. He stated he owes [facility] 272.00 and knows he can't get this through them bc he has to pay upfront for the medication first. Please advise Dr. Kendrick Fries thanks

## 2011-12-10 NOTE — Assessment & Plan Note (Signed)
This has been a stable interval for Taylor [name]. His COPD is well controlled, but he continues to struggle with leg weakness. It would help for him to exercise as much as tolerated and lose weight.  Plan: -continue perforomist -continue O2 2.5 L with exertion -exercise as much as possible and lose 20-30 lbs -RTC 6 months

## 2011-12-10 NOTE — Progress Notes (Signed)
Subjective:    Patient ID: Taylor [name], male    DOB: 03-14-1928, 78 y.o.   MRN: [id number]  Synopsis: Taylor [name] is a 78 y/o male with COPD (GOLD stage II by spirometry) who has a prior episode of exacerbation requiring BIPAP and an ICU stay who presented to the [facility] clinic for further evaluation in 12/2010.    HPI  04/09/11 ROV --Taylor [name] states that he is doing well.  He has not had increased shortness of breath and has a rare cough. He does not exercise.  He took the tiotropium but doesn't think that it helped his breathing at all.  He wants to have the back surgery as soon as possible.  04/12/11 Acute visit: Taylor [name] presented to our clinic today because he had noticed that his oxygen saturation had been lower and he had "a rattling" in his throat.  These symptoms started this morning.  He had a cough briefly in the morning but it had improved by the time he saw [place] in clinic, but he noted that his resting oxygen saturation was 88% so he decided to come into our clinic to be check out.  He stated that he was not more short of breath and was not having chest pain or swelling.  12/10/2011 ROV-- Taylor [name] had back surgery a few months ago and was in rehab for one month aferwards so he missed our last visit.  He is still having a lot of pain and weakness in both legs despite surgery.  He says that his lungs are "excelent" and he has no cough, dyspnea, or wheezing.  He is still using his perforomist and oxygen with exertion.  t   Review of Systems  Constitutional: Negative for fever, chills and diaphoresis.  Respiratory: Positive for cough. Negative for chest tightness, shortness of breath and wheezing.   Cardiovascular: Negative for chest pain and leg swelling.  Gastrointestinal: Negative for nausea, vomiting, abdominal pain and abdominal distention.      Objective:   Physical Exam   Filed Vitals:   12/10/11 1351  BP: 130/66  Pulse: 86  Temp: 97.9 F (36.6 C)  TempSrc: Oral    Height: 5\' 7"  (1.702 m)  Weight: 227 lb 6.4 oz (103.148 kg)  SpO2: 94%  Room air at rest; uses 2.5 L with exertion  Gen: chronically ill appearing obese white male in no acute distress;  HEENT: NCAT, EOMi, OP clear, neck supple without masses PULM: Few crackles in bases that clear, no wheezing CV: RRR, no mgr, no JVD AB: BS+, soft, nontender, no hsm Ext: warm, no edema, no clubbing, no cyanosis       Assessment & Plan:   COPD (chronic obstructive pulmonary disease) This has been a stable interval for Taylor [name]. His COPD is well controlled, but he continues to struggle with leg weakness. It would help for him to exercise as much as tolerated and lose weight.  Plan: -continue perforomist -continue O2 2.5 L with exertion -exercise as much as possible and lose 20-30 lbs -RTC 6 months  Hypoxemia Continue  2.5 L with exertion    Updated Medication List Outpatient Encounter Prescriptions as of 12/10/2011  Medication Sig Dispense Refill  . ALPRAZolam (XANAX) 0.25 MG tablet Take 0.25 mg by mouth at bedtime as needed.        Marland Kitchen aspirin 81 MG tablet Take 81 mg by mouth daily.        Marland Kitchen atorvastatin (LIPITOR) 10 MG tablet Take 10  mg by mouth daily.        . clotrimazole (LOTRIMIN) 1 % cream Apply 1 application topically 2 (two) times daily as needed.        . diltiazem (CARDIZEM CD) 180 MG 24 hr capsule Take 180 mg by mouth daily.        . fluticasone (FLONASE) 50 MCG/ACT nasal spray Place 2 sprays into the nose daily.        . formoterol (PERFOROMIST) 20 MCG/2ML nebulizer solution Take 2 mLs (20 mcg total) by nebulization 2 (two) times daily. Dx code 496  120 mL  4  . latanoprost (XALATAN) 0.005 % ophthalmic solution 1 drop at bedtime.        Marland Kitchen levothyroxine (SYNTHROID, LEVOTHROID) 100 MCG tablet Take 100 mcg by mouth as directed.        . Nefazodone HCl (SERZONE PO) Take 75 mg by mouth daily.        Marland Kitchen omeprazole (PRILOSEC) 20 MG capsule Take 20 mg by mouth daily.        .  OXYGEN-HELIUM IN Inhale 2.5 L into the lungs. As directed      . polyethylene glycol (MIRALAX / GLYCOLAX) packet Take 17 g by mouth daily.        . traMADol (ULTRAM) 50 MG tablet Take 50 mg by mouth every 6 (six) hours as needed.        . [DISCONTINUED] arformoterol (BROVANA) 15 MCG/2ML NEBU 1 vial in nebulizer twice daily Dx- 496  120 mL  4  . [DISCONTINUED] tiotropium (SPIRIVA HANDIHALER) 18 MCG inhalation capsule Place 1 capsule (18 mcg total) into inhaler and inhale daily.  30 capsule  5

## 2011-12-11 NOTE — Telephone Encounter (Signed)
PCCs, pls advise if pt can get perforomist nebs cheaper threw DME given below.  Thank you.

## 2011-12-11 NOTE — Telephone Encounter (Signed)
Nebs meds can go through dme if they are medicare only

## 2011-12-13 MED ORDER — FORMOTEROL FUMARATE 20 MCG/2ML IN NEBU: 20.0000 ug | INHALATION_SOLUTION | Freq: Two times a day (BID) | RESPIRATORY_TRACT | Status: DC

## 2011-12-13 NOTE — Telephone Encounter (Signed)
I spoke with pt and he stated try sending RX through [facility] and see how much this will cost. Have printed off and will fax to [facility].

## 2012-03-10 ENCOUNTER — Encounter (INDEPENDENT_AMBULATORY_CARE_PROVIDER_SITE_OTHER): Payer: Medicare Other | Admitting: *Deleted

## 2012-03-10 DIAGNOSIS — R609 Edema, unspecified: Secondary | ICD-10-CM

## 2012-03-25 ENCOUNTER — Encounter (INDEPENDENT_AMBULATORY_CARE_PROVIDER_SITE_OTHER): Payer: Medicare Other | Admitting: *Deleted

## 2012-05-18 IMAGING — CR DG CHEST 1V
1 series · 1 of 1 positions shown · non-contrast
Comparison: none

REASON FOR EXAM: lower O2 sat
COMMENTS:   May transport without cardiac monitor

PROCEDURE:     DXR - DXR CHEST 1 VIEWAP OR PA  - April 12, 2011  [DATE]
RESULT:

[w chest pa]
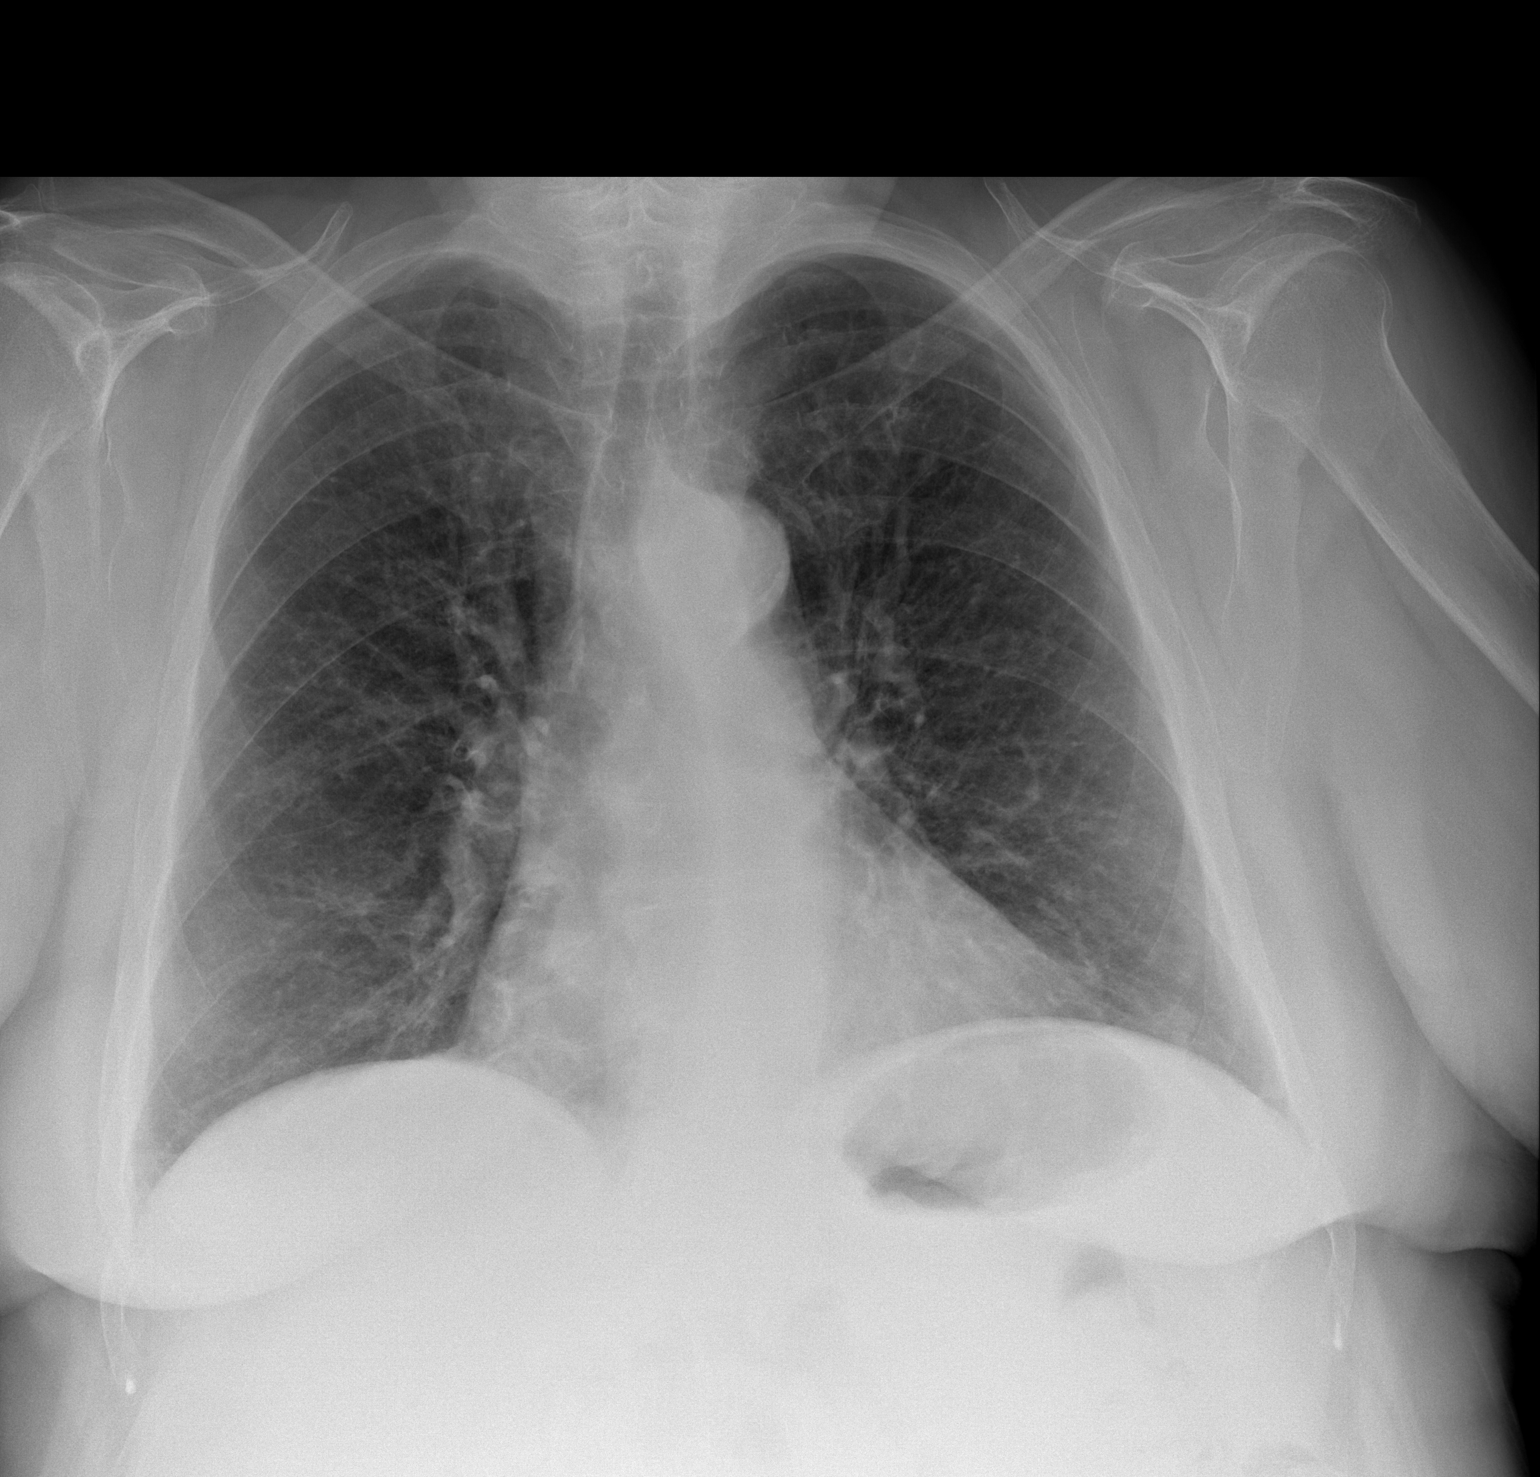

[1 of 1 positions shown; findings below may reference images not displayed]

FINDINGS: There are no focal regions of consolidation or focal infiltrates.
There is mild thickening of the interstitial markings without peribronchial
cuffing. The patient has taken a shallow inspiration. The cardiac silhouette
is within the upper limits of normal.  The visualized bony skeleton is
unremarkable.
IMPRESSION: Mild interstitial prominence which may represent a component of pulmonary
vascular congestion versus a nonedematous infiltrate. Underlying component
of pulmonary fibrosis is also a  diagnostic consideration.

## 2012-06-15 ENCOUNTER — Ambulatory Visit: Payer: Medicare Other | Admitting: Pulmonary Disease

## 2012-06-16 ENCOUNTER — Ambulatory Visit (INDEPENDENT_AMBULATORY_CARE_PROVIDER_SITE_OTHER): Payer: Medicare Other | Admitting: Pulmonary Disease

## 2012-06-16 ENCOUNTER — Encounter: Payer: Self-pay | Admitting: Pulmonary Disease

## 2012-06-16 VITALS — BP 132/70 | HR 68 | Temp 98.4°F | Ht 67.0 in | Wt 228.0 lb

## 2012-06-16 DIAGNOSIS — J449 Chronic obstructive pulmonary disease, unspecified: Secondary | ICD-10-CM

## 2012-06-16 DIAGNOSIS — R0902 Hypoxemia: Secondary | ICD-10-CM

## 2012-06-16 MED ORDER — TIOTROPIUM BROMIDE MONOHYDRATE 18 MCG IN CAPS: 18.0000 ug | ORAL_CAPSULE | Freq: Every day | RESPIRATORY_TRACT | Status: DC

## 2012-06-16 NOTE — Assessment & Plan Note (Signed)
Continue to half liters with exertion

## 2012-06-16 NOTE — Progress Notes (Signed)
Subjective:    Patient ID: Taylor [name], male    DOB: 1929-01-04, 79 y.o.   MRN: [id number]  Synopsis: Taylor [name] is a 79 y/o male with COPD (GOLD stage II by spirometry) who has a prior episode of exacerbation requiring BIPAP and an ICU stay who presented to the [facility] clinic for further evaluation in 12/2010.    HPI  04/09/11 ROV --Taylor [name] states that he is doing well.  He has not had increased shortness of breath and has a rare cough. He does not exercise.  He took the tiotropium but doesn't think that it helped his breathing at all.  He wants to have the back surgery as soon as possible.  04/12/11 Acute visit: Taylor [name] presented to our clinic today because he had noticed that his oxygen saturation had been lower and he had "a rattling" in his throat.  These symptoms started this morning.  He had a cough briefly in the morning but it had improved by the time he saw [place] in clinic, but he noted that his resting oxygen saturation was 88% so he decided to come into our clinic to be check out.  He stated that he was not more short of breath and was not having chest pain or swelling.  12/10/2011 ROV-- Taylor [name] had back surgery a few months ago and was in rehab for one month aferwards so he missed our last visit.  He is still having a lot of pain and weakness in both legs despite surgery.  He says that his lungs are "excelent" and he has no cough, dyspnea, or wheezing.  He is still using his perforomist and oxygen with exertion.   06/16/12 ROV --Taylor [name] recently fell from an office chair and is a little bruised but otherwise he's doing OK.  He says that breathing has been doing great, with no wheezing. He coughs occasionally, but it is not constant or bothersome. He denies sinus trouble, itchy eyes, or scratchy throat. In general he says his breathing is doing fine. He would like to switch to something cheaper than the Perforomist.  Past Medical History  Diagnosis Date  . Hypertension   .  Hyperlipidemia   . Hypothyroidism   . COPD (chronic obstructive pulmonary disease)   . Atrial fibrillation   . Glaucoma(365)   . Anxiety   . History of bladder cancer   . Lung nodule     SMALL  . OSA (obstructive sleep apnea)      Review of Systems  Constitutional: Negative for fever, chills and diaphoresis.  Respiratory: Positive for cough. Negative for chest tightness, shortness of breath and wheezing.   Cardiovascular: Negative for chest pain and leg swelling.  Gastrointestinal: Negative for nausea, vomiting, abdominal pain and abdominal distention.      Objective:   Physical Exam   Filed Vitals:   06/16/12 1207  BP: 132/70  Pulse: 68  Temp: 98.4 F (36.9 C)  TempSrc: Oral  Height: 5\' 7"  (1.702 m)  Weight: 228 lb (103.42 kg)  SpO2: 96%  2.5 L  Gen: chronically ill appearing obese white male in no acute distress;  HEENT: NCAT, EOMi, OP clear, neck supple without masses PULM: CTA, no wheeze CV: RRR, no mgr, no JVD AB: BS+, soft, nontender, no hsm Ext: warm, no edema, no clubbing, no cyanosis       Assessment & Plan:   COPD (chronic obstructive pulmonary disease) This is been a stable interval for Mr. Word.  Plan: -  Change Perforomist to Spiriva per his request to help him save money -Followup 6 months  Hypoxemia Continue to half liters with exertion    Updated Medication List Outpatient Encounter Prescriptions as of 06/16/2012  Medication Sig Dispense Refill  . ALPRAZolam (XANAX) 0.25 MG tablet Take 0.25 mg by mouth at bedtime as needed.        Marland Kitchen aspirin 81 MG tablet Take 81 mg by mouth daily.        Marland Kitchen atorvastatin (LIPITOR) 10 MG tablet Take 10 mg by mouth daily.        . clotrimazole (LOTRIMIN) 1 % cream Apply 1 application topically 2 (two) times daily as needed.        . diltiazem (CARDIZEM CD) 180 MG 24 hr capsule Take 180 mg by mouth daily.        . fluticasone (FLONASE) 50 MCG/ACT nasal spray Place 2 sprays into the nose daily.        .  formoterol (PERFOROMIST) 20 MCG/2ML nebulizer solution Take 2 mLs (20 mcg total) by nebulization 2 (two) times daily. Dx code 496  360 mL  3  . latanoprost (XALATAN) 0.005 % ophthalmic solution 1 drop at bedtime.        Marland Kitchen levothyroxine (SYNTHROID, LEVOTHROID) 100 MCG tablet Take 100 mcg by mouth as directed.        . Nefazodone HCl (SERZONE PO) Take 75 mg by mouth daily.        Marland Kitchen omeprazole (PRILOSEC) 20 MG capsule Take 20 mg by mouth daily.        . OXYGEN-HELIUM IN Inhale 2.5 L into the lungs. As directed      . polyethylene glycol (MIRALAX / GLYCOLAX) packet Take 17 g by mouth daily.        . traMADol (ULTRAM) 50 MG tablet Take 50 mg by mouth every 6 (six) hours as needed.         No facility-administered encounter medications on file as of 06/16/2012.

## 2012-06-16 NOTE — Patient Instructions (Signed)
We will apply for special pricing for your Spiriva.  If everything goes through then you can stop the perforomist and we will cancel the prescription.  Keep taking your medications as much as possible  We will see you back in 6 months or sooner if needed

## 2012-06-16 NOTE — Assessment & Plan Note (Signed)
This is been a stable interval for Taylor [name].  Plan: -Change Perforomist to Spiriva per his request to help him save money -Followup 6 months

## 2012-06-18 ENCOUNTER — Encounter: Payer: Self-pay | Admitting: Cardiovascular Disease

## 2012-07-13 ENCOUNTER — Other Ambulatory Visit: Payer: Self-pay | Admitting: Pulmonary Disease

## 2012-07-13 MED ORDER — TIOTROPIUM BROMIDE MONOHYDRATE 18 MCG IN CAPS: 18.0000 ug | ORAL_CAPSULE | Freq: Every day | RESPIRATORY_TRACT | Status: DC

## 2013-01-05 ENCOUNTER — Encounter: Payer: Self-pay | Admitting: Pulmonary Disease

## 2013-01-05 ENCOUNTER — Ambulatory Visit (INDEPENDENT_AMBULATORY_CARE_PROVIDER_SITE_OTHER): Payer: Medicare Other | Admitting: Pulmonary Disease

## 2013-01-05 VITALS — BP 118/68 | HR 70 | Temp 97.6°F | Ht 67.0 in | Wt 221.0 lb

## 2013-01-05 DIAGNOSIS — J449 Chronic obstructive pulmonary disease, unspecified: Secondary | ICD-10-CM

## 2013-01-05 DIAGNOSIS — R0902 Hypoxemia: Secondary | ICD-10-CM

## 2013-01-05 MED ORDER — TIOTROPIUM BROMIDE MONOHYDRATE 18 MCG IN CAPS: 18.0000 ug | ORAL_CAPSULE | Freq: Every day | RESPIRATORY_TRACT | Status: DC

## 2013-01-05 NOTE — Assessment & Plan Note (Signed)
Continue 2.5 L with exertion

## 2013-01-05 NOTE — Progress Notes (Signed)
Subjective:    Patient ID: Taylor [name], male    DOB: 05/03/1928, 79 y.o.   MRN: [id number]  Synopsis: Taylor [name] is a 79 y/o male with COPD (GOLD stage II by spirometry) who has a prior episode of exacerbation requiring BIPAP and an ICU stay who presented to the [facility] Pulmonary clinic for further evaluation in 12/2010.    HPI  04/09/11 ROV --Taylor [name] states that he is doing well.  He has not had increased shortness of breath and has a rare cough. He does not exercise.  He took the tiotropium but doesn't think that it helped his breathing at all.  He wants to have the back surgery as soon as possible.  04/12/11 Acute visit: Taylor [name] presented to our clinic today because he had noticed that his oxygen saturation had been lower and he had "a rattling" in his throat.  These symptoms started this morning.  He had a cough briefly in the morning but it had improved by the time he saw [place] in clinic, but he noted that his resting oxygen saturation was 88% so he decided to come into our clinic to be check out.  He stated that he was not more short of breath and was not having chest pain or swelling.  12/10/2011 ROV-- Taylor [name] had back surgery a few months ago and was in rehab for one month aferwards so he missed our last visit.  He is still having a lot of pain and weakness in both legs despite surgery.  He says that his lungs are "excelent" and he has no cough, dyspnea, or wheezing.  He is still using his perforomist and oxygen with exertion.   06/16/12 ROV --Taylor [name] recently fell from an office chair and is a little bruised but otherwise he's doing OK.  He says that breathing has been doing great, with no wheezing. He coughs occasionally, but it is not constant or bothersome. He denies sinus trouble, itchy eyes, or scratchy throat. In general he says his breathing is doing fine. He would like to switch to something cheaper than the Perforomist.  01/05/2013 ROV > Taylor [name] has been doing well since the last  visit and had a good Thanksgiving.  He was able to spend time with family which was nice.  He says that he has been breathing well.  He was recently diagnosed with diabetes and feels that he is managing it OK.  He is struggling with his diet a little bit regarding this.  His primary care doctor is happy with his diabetes care so far.  He is not using the Spiriva on an as needed basis.  He doesn't have too much trouble breathing. He hasn't been too active mostly because of his medical problems.    He has had a flu shot this year.  Past Medical History  Diagnosis Date  . Hypertension   . Hyperlipidemia   . Hypothyroidism   . COPD (chronic obstructive pulmonary disease)   . Atrial fibrillation   . Glaucoma   . Anxiety   . History of bladder cancer   . Lung nodule     SMALL  . OSA (obstructive sleep apnea)      Review of Systems  Constitutional: Negative for fever, chills and diaphoresis.  Respiratory: Negative for cough, chest tightness, shortness of breath and wheezing.   Cardiovascular: Negative for chest pain and leg swelling.  Gastrointestinal: Negative for nausea, vomiting, abdominal pain and abdominal distention.  Objective:   Physical Exam   Filed Vitals:   01/05/13 1332  BP: 118/68  Pulse: 70  Temp: 97.6 F (36.4 C)  TempSrc: Oral  Height: 5\' 7"  (1.702 m)  Weight: 221 lb (100.245 kg)  SpO2: 94%  2.5 L  Gen: chronically ill appearing obese white male in no acute distress;  HEENT: NCAT, EOMi, OP clear, neck supple without masses PULM: exp wheezing bilaterally, good air movement CV: RRR, no mgr, no JVD AB: BS+, soft, nontender, no hsm Ext: warm, no edema, no clubbing, no cyanosis      Assessment & Plan:   COPD (chronic obstructive pulmonary disease) This has been a stable interval for [facility].    Plan: -use spiriva daily -f/u one year  Hypoxemia Continue 2.5 L with exertion    Updated Medication List Outpatient Encounter Prescriptions as of  01/05/2013  Medication Sig  . acetaminophen (TYLENOL) 325 MG tablet Take 325 mg by mouth every 4 (four) hours as needed.  . ALPRAZolam (XANAX) 0.25 MG tablet Take 0.25 mg by mouth at bedtime as needed.    Marland Kitchen aspirin 81 MG tablet Take 81 mg by mouth daily.    Marland Kitchen atorvastatin (LIPITOR) 10 MG tablet Take 10 mg by mouth daily.    . cholecalciferol (VITAMIN D) 1000 UNITS tablet Take 1,000 Units by mouth daily.  . clotrimazole (LOTRIMIN) 1 % cream Apply 1 application topically 2 (two) times daily as needed.    . diltiazem (CARDIZEM CD) 180 MG 24 hr capsule Take 180 mg by mouth daily.    . DULoxetine (CYMBALTA) 30 MG capsule Take 30 mg by mouth daily.  . fluticasone (FLONASE) 50 MCG/ACT nasal spray Place 2 sprays into the nose daily.    . formoterol (PERFOROMIST) 20 MCG/2ML nebulizer solution Take 2 mLs (20 mcg total) by nebulization 2 (two) times daily. Dx code 496  . latanoprost (XALATAN) 0.005 % ophthalmic solution 1 drop at bedtime.    Marland Kitchen levothyroxine (SYNTHROID, LEVOTHROID) 100 MCG tablet Take 100 mcg by mouth as directed.    . metFORMIN (GLUCOPHAGE) 500 MG tablet Take 500 mg by mouth daily with breakfast.  . omeprazole (PRILOSEC) 20 MG capsule Take 20 mg by mouth daily.    . OXYGEN-HELIUM IN Inhale 2.5 L into the lungs. As directed  . polyethylene glycol (MIRALAX / GLYCOLAX) packet Take 17 g by mouth daily.    . traMADol (ULTRAM) 50 MG tablet Take 50 mg by mouth every 6 (six) hours as needed.    . tiotropium (SPIRIVA HANDIHALER) 18 MCG inhalation capsule Place 1 capsule (18 mcg total) into inhaler and inhale daily.  . [DISCONTINUED] Nefazodone HCl (SERZONE PO) Take 75 mg by mouth daily.

## 2013-01-05 NOTE — Patient Instructions (Signed)
Keep taking your Spiriva every day Keep using your Oxygen at 2.5 L daily We will see you back in one year or sooner if needed

## 2013-01-05 NOTE — Assessment & Plan Note (Signed)
This has been a stable interval for [facility].    Plan: -use spiriva daily -f/u one year

## 2013-01-08 ENCOUNTER — Telehealth: Payer: Self-pay | Admitting: Pulmonary Disease

## 2013-01-08 NOTE — Telephone Encounter (Signed)
I spoke with the pt and he states that the spiriva is costing him $92 a month and he cannot afford this. He is asking for an alternative. Please advise on an alternative, we could also try patient assistance. Please advise.Taylor [name], CMA Allergies  Allergen Reactions  . Chantix [Varenicline Tartrate]     depression

## 2013-01-08 NOTE — Telephone Encounter (Signed)
Returning call can be reached at [phone].Taylor [name]

## 2013-01-08 NOTE — Telephone Encounter (Signed)
Called and spoke with pt and he is aware that the forms for assistance for the spiriva have been placed in the mail.  He is aware to follow the directions on the form and will fill this out and bring back to [place] once completed.

## 2013-01-08 NOTE — Telephone Encounter (Signed)
lmomtcb x1 

## 2013-01-08 NOTE — Telephone Encounter (Signed)
Can you fill out the [facility] assistance form for him and explain to him that it will help him get it for cheaper?

## 2013-02-16 ENCOUNTER — Telehealth: Payer: Self-pay

## 2013-02-16 ENCOUNTER — Other Ambulatory Visit: Payer: Self-pay

## 2013-02-16 MED ORDER — TIOTROPIUM BROMIDE MONOHYDRATE 18 MCG IN CAPS
18.0000 ug | ORAL_CAPSULE | Freq: Every day | RESPIRATORY_TRACT | Status: DC
Start: 2013-02-16 — End: 2013-04-09

## 2013-02-16 NOTE — Telephone Encounter (Signed)
Pt dropped off his denied medication assistance form at the office.  I called to figure out why it was not processed.  The form was outdated, it needed a written rx, and the pt's proof of income needed to be from the [facility] instead of his bank statement.  I spoke with the pt, he is aware. I filled out what I could for the form, attached the rx, and it is being mailed to the pt per his request.  Nothing further needed at this time.

## 2013-02-17 ENCOUNTER — Telehealth: Payer: Self-pay | Admitting: Pulmonary Disease

## 2013-02-17 NOTE — Telephone Encounter (Signed)
The pt had dropped off his [employer] paperwork yesterday while I was with another pt.  I called to ask what this was, he said his paperwork was declined and he didn't know what to do.  I called [employer] To figure out why his paperwork was declined.  Bethena Roys (who I spoke to yesterday) said that we needed a hard copy of the rx, the form that the pt had filled out was outdated, and he needed different documentation of his income.  I relayed this info to the pt, and per his request the completed paperwork from our end was put in the mail to his home so he could fill out his part accordingly.    I spoke with Elmo Putt this morning after seeing the message from [facility] to clarify that the steps we were taking as of yesterday were correct.  I asked if faxing the rx would expedite the pt's process, and she stated that they need all of the paperwork together, rx and application.    Nothing further is needed on our end at this time.

## 2013-02-17 NOTE — Telephone Encounter (Signed)
According to epic this was printed off 02/16/13 for spiriva.  Caryl Pina do you have signed RX so we can fax? If not can you print another RX and have BQ sign this and fax it over? thanks

## 2013-03-12 ENCOUNTER — Telehealth: Payer: Self-pay | Admitting: Pulmonary Disease

## 2013-03-12 NOTE — Telephone Encounter (Signed)
Pt received letter from [facility] needing qualifying sats. He is going to come by on 03/16/13 to have this done. Nothing further was needed at this time.

## 2013-03-16 ENCOUNTER — Encounter: Payer: Self-pay | Admitting: Pulmonary Disease

## 2013-03-16 ENCOUNTER — Ambulatory Visit (INDEPENDENT_AMBULATORY_CARE_PROVIDER_SITE_OTHER): Payer: Medicare Other | Admitting: Pulmonary Disease

## 2013-03-16 DIAGNOSIS — J449 Chronic obstructive pulmonary disease, unspecified: Secondary | ICD-10-CM

## 2013-03-16 NOTE — Progress Notes (Signed)
Walk was completed today due insurance purposes.

## 2013-04-05 ENCOUNTER — Telehealth: Payer: Self-pay | Admitting: Pulmonary Disease

## 2013-04-05 NOTE — Telephone Encounter (Signed)
lmtcb X1 

## 2013-04-05 NOTE — Telephone Encounter (Signed)
lmtcb X2 on home phone.  Cell has no voicemail set up.

## 2013-04-05 NOTE — Telephone Encounter (Signed)
PT RETURNING CALL.Taylor [name] °

## 2013-04-06 NOTE — Telephone Encounter (Signed)
Sat test was done on 03-16-13. I have sent a message to [facility] to see if she needs these faxed or can she pull from epic. [place] Bing, CMA

## 2013-04-06 NOTE — Telephone Encounter (Signed)
LMTCB-Melissa at [facility]. Ask for Theona Muhs.

## 2013-04-06 NOTE — Telephone Encounter (Signed)
Melissa returned call  °

## 2013-04-06 NOTE — Telephone Encounter (Signed)
Taylor [name] calling stating she needs ov notes and new order.

## 2013-04-06 NOTE — Telephone Encounter (Signed)
Spoke with Melissa at [facility] is aware of qualifying sats however patient needs a face to face visit with 30 days as well. Pt was last seen 01/2013; I called patient and explained to him the situation. He was very understanding of this and will come to the [place] office to see BQ on Friday 04-09-13 at noon.

## 2013-04-07 ENCOUNTER — Encounter: Payer: Self-pay | Admitting: Pulmonary Disease

## 2013-04-07 ENCOUNTER — Telehealth: Payer: Self-pay

## 2013-04-07 NOTE — Telephone Encounter (Signed)
Pt's request for patient assistance to [name] for his Spiriva has been declined for having "non-medicare insurance that covers prescription drugs".  The patient had brought a note to the [place] office suggesting Atrovent as an alternative for spiriva.  I discussed this suggestion with Dr. Lake Bells.  He said that he would be willing to try Combivent qid.  I relayed this info to the patient.  He is not happy about having to use a medication qid.  He has an appt with Dr. Lake Bells on Friday in the [place] office.  He would like to discuss his medication changes further at that time.  Nothing further needed at this time.

## 2013-04-09 ENCOUNTER — Encounter: Payer: Self-pay | Admitting: Pulmonary Disease

## 2013-04-09 ENCOUNTER — Ambulatory Visit (INDEPENDENT_AMBULATORY_CARE_PROVIDER_SITE_OTHER): Payer: Medicare Other | Admitting: Pulmonary Disease

## 2013-04-09 VITALS — BP 144/76 | HR 72 | Ht 67.0 in | Wt 226.0 lb

## 2013-04-09 DIAGNOSIS — R0902 Hypoxemia: Secondary | ICD-10-CM

## 2013-04-09 DIAGNOSIS — J449 Chronic obstructive pulmonary disease, unspecified: Secondary | ICD-10-CM

## 2013-04-09 MED ORDER — UMECLIDINIUM-VILANTEROL 62.5-25 MCG/INH IN AEPB: 1.0000 | INHALATION_SPRAY | Freq: Every day | RESPIRATORY_TRACT | Status: DC

## 2013-04-09 NOTE — Assessment & Plan Note (Signed)
He continues to use and benefit from his oxygen.  We checked his O2 saturation today, see above.  Plan: -O2 via nasal canula 2.5 L with exertion and qHS

## 2013-04-09 NOTE — Assessment & Plan Note (Signed)
This has been a stable interval. He is frustrated by the cost of Spiriva. Plan: -change to Anoro daily

## 2013-04-09 NOTE — Patient Instructions (Signed)
Stop spiriva Take Anoro daily no matter how you feel Use your oxygen with sleep and when you exert yourself We will see you back in 6 months or sooner if needed

## 2013-04-09 NOTE — Progress Notes (Signed)
Subjective:    Patient ID: Taylor [name], male    DOB: 1928-04-13, 80 y.o.   MRN: [id number]  Synopsis: Taylor [name] is a 80 y/o male with COPD (GOLD stage II by spirometry) who has a prior episode of exacerbation requiring BIPAP and an ICU stay who presented to the [facility] clinic for further evaluation in 12/2010.    HPI  04/09/11 ROV --Taylor [name] states that he is doing well.  He has not had increased shortness of breath and has a rare cough. He does not exercise.  He took the tiotropium but doesn't think that it helped his breathing at all.  He wants to have the back surgery as soon as possible.  04/12/11 Acute visit: Taylor [name] presented to our clinic today because he had noticed that his oxygen saturation had been lower and he had "a rattling" in his throat.  These symptoms started this morning.  He had a cough briefly in the morning but it had improved by the time he saw [place] in clinic, but he noted that his resting oxygen saturation was 88% so he decided to come into our clinic to be check out.  He stated that he was not more short of breath and was not having chest pain or swelling.  12/10/2011 ROV-- Taylor [name] had back surgery a few months ago and was in rehab for one month aferwards so he missed our last visit.  He is still having a lot of pain and weakness in both legs despite surgery.  He says that his lungs are "excelent" and he has no cough, dyspnea, or wheezing.  He is still using his perforomist and oxygen with exertion.   06/16/12 ROV --Taylor [name] recently fell from an office chair and is a little bruised but otherwise he's doing OK.  He says that breathing has been doing great, with no wheezing. He coughs occasionally, but it is not constant or bothersome. He denies sinus trouble, itchy eyes, or scratchy throat. In general he says his breathing is doing fine. He would like to switch to something cheaper than the Perforomist.  01/05/2013 ROV > Taylor [name] has been doing well since the last  visit and had a good Thanksgiving.  He was able to spend time with family which was nice.  He says that he has been breathing well.  He was recently diagnosed with diabetes and feels that he is managing it OK.  He is struggling with his diet a little bit regarding this.  His primary care doctor is happy with his diabetes care so far.  He is not using the Spiriva on an as needed basis.  He doesn't have too much trouble breathing. He hasn't been too active mostly because of his medical problems.    He has had a flu shot this year.  04/09/2013 ROV > Taylor [name] has been doing OK.  He continues to use and benefit from his oxygen.  He also uses his Spiriva every day but he has been frustrated by the cost.  He noted no new dyspnea since the last visit.  He has not had a new cough.  Past Medical History  Diagnosis Date  . Hypertension   . Hyperlipidemia   . Hypothyroidism   . COPD (chronic obstructive pulmonary disease)   . Atrial fibrillation   . Glaucoma   . Anxiety   . History of bladder cancer   . Lung nodule     SMALL  . OSA (obstructive sleep apnea)  Review of Systems  Constitutional: Negative for fever, chills and diaphoresis.  Respiratory: Negative for cough, chest tightness, shortness of breath and wheezing.   Cardiovascular: Negative for chest pain and leg swelling.  Gastrointestinal: Negative for nausea, vomiting, abdominal pain and abdominal distention.      Objective:   Physical Exam   Filed Vitals:   04/09/13 1210  BP: 144/76  Pulse: 72  Height: 5\' 7"  (1.702 m)  Weight: 226 lb (102.513 kg)  SpO2: 93%  RA  O2 saturation: resting on RA 93%; walking on room air (300 feet): 87%; walking on 2.5 L 95%  Gen: chronically ill appearing obese white male in no acute distress;  HEENT: NCAT, EOMi, OP clear, neck supple without masses PULM: few crackles throughout, good air movement CV: RRR, no mgr, no JVD AB: BS+, soft, nontender, no hsm Ext: warm, no edema, no clubbing,  no cyanosis      Assessment & Plan:   COPD (chronic obstructive pulmonary disease) This has been a stable interval. He is frustrated by the cost of Spiriva. Plan: -change to Anoro daily  Hypoxemia He continues to use and benefit from his oxygen.  We checked his O2 saturation today, see above.  Plan: -O2 via nasal canula 2.5 L with exertion and qHS    Updated Medication List Outpatient Encounter Prescriptions as of 04/09/2013  Medication Sig  . acetaminophen (TYLENOL) 325 MG tablet Take 325 mg by mouth every 4 (four) hours as needed.  . ALPRAZolam (XANAX) 0.25 MG tablet Take 0.25 mg by mouth at bedtime as needed.    Marland Kitchen aspirin 81 MG tablet Take 81 mg by mouth daily.    Marland Kitchen atorvastatin (LIPITOR) 10 MG tablet Take 10 mg by mouth daily.    . cholecalciferol (VITAMIN D) 1000 UNITS tablet Take 2,000 Units by mouth daily.   Marland Kitchen diltiazem (CARDIZEM CD) 180 MG 24 hr capsule Take 180 mg by mouth daily.    . fluticasone (FLONASE) 50 MCG/ACT nasal spray Place 2 sprays into the nose daily.    Marland Kitchen latanoprost (XALATAN) 0.005 % ophthalmic solution 1 drop at bedtime.    Marland Kitchen levothyroxine (SYNTHROID, LEVOTHROID) 100 MCG tablet Take 100 mcg by mouth as directed.    . metFORMIN (GLUCOPHAGE) 500 MG tablet Take 500 mg by mouth daily with breakfast.  . omeprazole (PRILOSEC) 20 MG capsule Take 20 mg by mouth daily.    . OXYGEN-HELIUM IN Inhale 2.5 L into the lungs. Liquid and a concentrator  . polyethylene glycol (MIRALAX / GLYCOLAX) packet Take 17 g by mouth daily.    . DULoxetine (CYMBALTA) 30 MG capsule Take 30 mg by mouth daily.  . [DISCONTINUED] clotrimazole (LOTRIMIN) 1 % cream Apply 1 application topically 2 (two) times daily as needed.    . [DISCONTINUED] formoterol (PERFOROMIST) 20 MCG/2ML nebulizer solution Take 2 mLs (20 mcg total) by nebulization 2 (two) times daily. Dx code 63  . [DISCONTINUED] tiotropium (SPIRIVA HANDIHALER) 18 MCG inhalation capsule Place 1 capsule (18 mcg total) into inhaler  and inhale daily.  . [DISCONTINUED] traMADol (ULTRAM) 50 MG tablet Take 50 mg by mouth every 6 (six) hours as needed.

## 2013-04-16 ENCOUNTER — Telehealth: Payer: Self-pay | Admitting: Pulmonary Disease

## 2013-04-16 NOTE — Telephone Encounter (Signed)
Spoke with pt. He has decided to not take Anoro anymore. Breathing is doing well and doesn't feel he needs it. Pt read the insert and feels there are to many side effects to Anoro. Advised him that I would forward this message to Dr. Lake Bells.

## 2013-04-19 NOTE — Telephone Encounter (Signed)
Spoke with pt. He is aware of BQ's recs. States that he can't afford to buy either medication. Advised the pt that if his breathing gets worse to please contact our office.

## 2013-04-19 NOTE — Telephone Encounter (Signed)
Then he really needs to resume either of the following: 1) Spiriva daily or 2) Combivent qid

## 2013-04-19 NOTE — Telephone Encounter (Signed)
noted 

## 2013-11-15 ENCOUNTER — Encounter: Payer: Self-pay | Admitting: Pulmonary Disease

## 2013-11-15 ENCOUNTER — Ambulatory Visit (INDEPENDENT_AMBULATORY_CARE_PROVIDER_SITE_OTHER): Payer: Medicare Other | Admitting: Pulmonary Disease

## 2013-11-15 VITALS — BP 126/72 | HR 76 | Ht 67.0 in | Wt 224.0 lb

## 2013-11-15 DIAGNOSIS — J432 Centrilobular emphysema: Secondary | ICD-10-CM

## 2013-11-15 DIAGNOSIS — Z23 Encounter for immunization: Secondary | ICD-10-CM

## 2013-11-15 MED ORDER — ALBUTEROL SULFATE (2.5 MG/3ML) 0.083% IN NEBU: 2.5000 mg | INHALATION_SOLUTION | Freq: Four times a day (QID) | RESPIRATORY_TRACT | Status: DC | PRN

## 2013-11-15 NOTE — Assessment & Plan Note (Signed)
This has been a stable interval for Taylor [name]. He has not been taking any long acting bronchodilators and fortunately he has not had any exacerbations. He does not take inhalers because of cost.  Plan: -Take albuterol 2-3 times a day regularly to see if this helps -If that isn't helping does use albuterol nebulizer as needed -Flu shot today -Have primary care physician administer Prevnar when he visits them next month - Followup 6 months

## 2013-11-15 NOTE — Addendum Note (Signed)
Addended by: Len Blalock on: 11/15/2013 04:41 PM   Modules accepted: Orders

## 2013-11-15 NOTE — Patient Instructions (Signed)
Take the albuterol 2-4 times a day for about a week to see if that helps with your breathing If it helps then continue to take it that way Otherwise just use it every 4 hours as needed for shortness of breath We will see you back in 6 months or sooner if needed

## 2013-11-15 NOTE — Progress Notes (Signed)
Subjective:    Patient ID: Taylor [name], male    DOB: 09/18/1928, 80 y.o.   MRN: [id number]  Synopsis: Taylor [name] is a 80 y/o male with COPD (GOLD stage II by spirometry) who has a prior episode of exacerbation requiring BIPAP and an ICU stay who presented to the [facility] clinic for further evaluation in 12/2010.    HPI  11/15/2013 ROV > Taylor [name] says he is feeling great right now and has been great lately.  He says his breathing has been OK. He gets dyspnea if he walks too far so he paces himself.  He has not been exercising regularly.  He walks around his house and out to his mailbox.  He is currently not taking anything as far as inhalers go right now due to cost.  He will take samples of Spiriva. He has not had to take any prednisone or antibiotics since the last visit.  He has not had a flu shot and he has only had one pneumonia shot.  He says that he has a nebulizer machine at home.    Past Medical History  Diagnosis Date  . Hypertension   . Hyperlipidemia   . Hypothyroidism   . COPD (chronic obstructive pulmonary disease)   . Atrial fibrillation   . Glaucoma   . Anxiety   . History of bladder cancer   . Lung nodule     SMALL  . OSA (obstructive sleep apnea)      Review of Systems  Constitutional: Negative for fever, chills and diaphoresis.  Respiratory: Negative for cough, chest tightness, shortness of breath and wheezing.   Cardiovascular: Negative for chest pain and leg swelling.  Gastrointestinal: Negative for nausea, vomiting, abdominal pain and abdominal distention.      Objective:   Physical Exam   Filed Vitals:   11/15/13 1359  BP: 126/72  Pulse: 76  Height: 5\' 7"  (1.702 m)  Weight: 224 lb (101.606 kg)  SpO2: 97%  2.5L [place]   Gen: chronically ill appearing obese white male in no acute distress;  HEENT: NCAT, EOMi, OP clear,  PULM: few crackles bases, otherwise clear CV: RRR, no mgr, no JVD AB: BS+, soft, nontender, no hsm Ext: warm, no edema,  no clubbing, no cyanosis      Assessment & Plan:   COPD (chronic obstructive pulmonary disease) This has been a stable interval for Taylor [name]. He has not been taking any long acting bronchodilators and fortunately he has not had any exacerbations. He does not take inhalers because of cost.  Plan: -Take albuterol 2-3 times a day regularly to see if this helps -If that isn't helping does use albuterol nebulizer as needed -Flu shot today -Have primary care physician administer Prevnar when he visits them next month - Followup 6 months   chronic hypoxemic respiratory failure: Continue 2.5 L of oxygen with exertion.  Updated Medication List Outpatient Encounter Prescriptions as of 11/15/2013  Medication Sig  . acetaminophen (TYLENOL) 325 MG tablet Take 650 mg by mouth 3 (three) times daily.   Marland Kitchen ALPRAZolam (XANAX) 0.25 MG tablet Take 0.25 mg by mouth at bedtime as needed.    Marland Kitchen aspirin 81 MG tablet Take 81 mg by mouth daily.    Marland Kitchen atorvastatin (LIPITOR) 10 MG tablet Take 10 mg by mouth daily.    . cholecalciferol (VITAMIN D) 1000 UNITS tablet Take 2,000 Units by mouth daily.   Marland Kitchen diltiazem (CARDIZEM CD) 180 MG 24 hr capsule Take 180 mg by  mouth daily.    . DULoxetine (CYMBALTA) 30 MG capsule Take 30 mg by mouth daily.  . fluticasone (FLONASE) 50 MCG/ACT nasal spray Place 2 sprays into the nose daily.    Marland Kitchen latanoprost (XALATAN) 0.005 % ophthalmic solution 1 drop at bedtime.    Marland Kitchen levothyroxine (SYNTHROID, LEVOTHROID) 100 MCG tablet Take 100 mcg by mouth daily before breakfast.   . metFORMIN (GLUCOPHAGE) 500 MG tablet Take 500 mg by mouth daily with breakfast.  . OXYGEN-HELIUM IN Inhale 2.5 L into the lungs. Liquid and a concentrator  . polyethylene glycol (MIRALAX / GLYCOLAX) packet Take 17 g by mouth daily.    Marland Kitchen omeprazole (PRILOSEC) 20 MG capsule Take 20 mg by mouth daily.    . [DISCONTINUED] Umeclidinium-Vilanterol (ANORO ELLIPTA) 62.5-25 MCG/INH AEPB Inhale 1 puff into the lungs daily.

## 2013-12-23 ENCOUNTER — Telehealth: Payer: Self-pay | Admitting: Pulmonary Disease

## 2013-12-23 MED ORDER — ALBUTEROL SULFATE (2.5 MG/3ML) 0.083% IN NEBU: 2.5000 mg | INHALATION_SOLUTION | Freq: Four times a day (QID) | RESPIRATORY_TRACT | Status: DC | PRN

## 2013-12-23 NOTE — Telephone Encounter (Signed)
Called and spoke with pt and he is aware of Rx that has been resent to [facility] [place].  Nothing further is needed.

## 2013-12-27 ENCOUNTER — Telehealth: Payer: Self-pay | Admitting: Pulmonary Disease

## 2013-12-27 NOTE — Telephone Encounter (Signed)
Spoke with pt, states that his insurance company isn't covering his albuterol neb and he is having to pay out of pocket for it.  He states he has enough to last the year and that we can cancel his rx.  I asked if he picks it up or if it is delivered, he [facility] [place]  I advised him that I'd call and cancel the rx but he doesn't have to pick it up from the pharmacy if he doesn't need it.  Nothing further needed at this time.

## 2014-01-04 HISTORY — PX: CYSTOSCOPY: SUR368

## 2014-02-04 DIAGNOSIS — N200 Calculus of kidney: Secondary | ICD-10-CM

## 2014-02-04 HISTORY — DX: Calculus of kidney: N20.0

## 2014-02-11 ENCOUNTER — Other Ambulatory Visit (HOSPITAL_COMMUNITY): Payer: Self-pay | Admitting: Endocrinology

## 2014-02-11 ENCOUNTER — Ambulatory Visit (HOSPITAL_COMMUNITY)
Admission: RE | Admit: 2014-02-11 | Discharge: 2014-02-11 | Disposition: A | Discharge status: Home / Self Care | Payer: Medicare Other | Source: Ambulatory Visit | Attending: Vascular Surgery | Admitting: Vascular Surgery

## 2014-02-11 DIAGNOSIS — M79662 Pain in left lower leg: Secondary | ICD-10-CM

## 2014-03-02 ENCOUNTER — Encounter (HOSPITAL_COMMUNITY): Payer: Self-pay | Admitting: *Deleted

## 2014-03-02 ENCOUNTER — Inpatient Hospital Stay (HOSPITAL_COMMUNITY)
Admission: EM | Admit: 2014-03-02 | Discharge: 2014-03-07 | DRG: 556 | Disposition: A | Discharge status: Skilled Nursing Facility | Payer: Medicare Other | Attending: Endocrinology | Admitting: Endocrinology

## 2014-03-02 ENCOUNTER — Emergency Department (HOSPITAL_COMMUNITY): Payer: Medicare Other

## 2014-03-02 DIAGNOSIS — I2510 Atherosclerotic heart disease of native coronary artery without angina pectoris: Secondary | ICD-10-CM | POA: Diagnosis present

## 2014-03-02 DIAGNOSIS — E785 Hyperlipidemia, unspecified: Secondary | ICD-10-CM | POA: Diagnosis present

## 2014-03-02 DIAGNOSIS — R262 Difficulty in walking, not elsewhere classified: Secondary | ICD-10-CM | POA: Diagnosis present

## 2014-03-02 DIAGNOSIS — Z7982 Long term (current) use of aspirin: Secondary | ICD-10-CM | POA: Diagnosis not present

## 2014-03-02 DIAGNOSIS — Z87891 Personal history of nicotine dependence: Secondary | ICD-10-CM | POA: Diagnosis not present

## 2014-03-02 DIAGNOSIS — I739 Peripheral vascular disease, unspecified: Secondary | ICD-10-CM | POA: Diagnosis present

## 2014-03-02 DIAGNOSIS — M545 Low back pain, unspecified: Secondary | ICD-10-CM | POA: Diagnosis present

## 2014-03-02 DIAGNOSIS — M5136 Other intervertebral disc degeneration, lumbar region: Secondary | ICD-10-CM | POA: Diagnosis present

## 2014-03-02 DIAGNOSIS — G4733 Obstructive sleep apnea (adult) (pediatric): Secondary | ICD-10-CM | POA: Diagnosis present

## 2014-03-02 DIAGNOSIS — E119 Type 2 diabetes mellitus without complications: Secondary | ICD-10-CM | POA: Diagnosis present

## 2014-03-02 DIAGNOSIS — Z888 Allergy status to other drugs, medicaments and biological substances status: Secondary | ICD-10-CM

## 2014-03-02 DIAGNOSIS — J449 Chronic obstructive pulmonary disease, unspecified: Secondary | ICD-10-CM | POA: Diagnosis present

## 2014-03-02 DIAGNOSIS — I10 Essential (primary) hypertension: Secondary | ICD-10-CM | POA: Diagnosis present

## 2014-03-02 DIAGNOSIS — Z881 Allergy status to other antibiotic agents status: Secondary | ICD-10-CM

## 2014-03-02 DIAGNOSIS — M8580 Other specified disorders of bone density and structure, unspecified site: Secondary | ICD-10-CM | POA: Diagnosis present

## 2014-03-02 DIAGNOSIS — M6281 Muscle weakness (generalized): Principal | ICD-10-CM | POA: Diagnosis present

## 2014-03-02 DIAGNOSIS — H409 Unspecified glaucoma: Secondary | ICD-10-CM | POA: Diagnosis present

## 2014-03-02 DIAGNOSIS — R52 Pain, unspecified: Secondary | ICD-10-CM

## 2014-03-02 DIAGNOSIS — E039 Hypothyroidism, unspecified: Secondary | ICD-10-CM | POA: Diagnosis present

## 2014-03-02 DIAGNOSIS — M549 Dorsalgia, unspecified: Secondary | ICD-10-CM | POA: Diagnosis present

## 2014-03-02 DIAGNOSIS — Z6837 Body mass index (BMI) 37.0-37.9, adult: Secondary | ICD-10-CM | POA: Diagnosis not present

## 2014-03-02 DIAGNOSIS — E669 Obesity, unspecified: Secondary | ICD-10-CM | POA: Diagnosis present

## 2014-03-02 DIAGNOSIS — I4891 Unspecified atrial fibrillation: Secondary | ICD-10-CM | POA: Diagnosis present

## 2014-03-02 DIAGNOSIS — F329 Major depressive disorder, single episode, unspecified: Secondary | ICD-10-CM | POA: Diagnosis present

## 2014-03-02 DIAGNOSIS — Z8551 Personal history of malignant neoplasm of bladder: Secondary | ICD-10-CM

## 2014-03-02 DIAGNOSIS — F419 Anxiety disorder, unspecified: Secondary | ICD-10-CM | POA: Diagnosis present

## 2014-03-02 DIAGNOSIS — M79605 Pain in left leg: Secondary | ICD-10-CM | POA: Diagnosis present

## 2014-03-02 LAB — I-STAT CHEM 8, ED
BUN: 22 mg/dL (ref 6–23)
Calcium, Ion: 1.25 mmol/L (ref 1.13–1.30)
Chloride: 98 mmol/L (ref 96–112)
Creatinine, Ser: 0.9 mg/dL (ref 0.50–1.35)
GLUCOSE: 122 mg/dL — AB (ref 70–99)
HCT: 43 % (ref 39.0–52.0)
HEMOGLOBIN: 14.6 g/dL (ref 13.0–17.0)
Potassium: 5.2 mmol/L — ABNORMAL HIGH (ref 3.5–5.1)
SODIUM: 139 mmol/L (ref 135–145)
TCO2: 28 mmol/L (ref 0–100)

## 2014-03-02 LAB — CBC WITH DIFFERENTIAL/PLATELET
BASOS PCT: 0 % (ref 0–1)
Basophils Absolute: 0.1 10*3/uL (ref 0.0–0.1)
EOS ABS: 0.4 10*3/uL (ref 0.0–0.7)
Eosinophils Relative: 4 % (ref 0–5)
HEMATOCRIT: 40 % (ref 39.0–52.0)
Hemoglobin: 12.8 g/dL — ABNORMAL LOW (ref 13.0–17.0)
Lymphocytes Relative: 16 % (ref 12–46)
Lymphs Abs: 2 10*3/uL (ref 0.7–4.0)
MCH: 31.3 pg (ref 26.0–34.0)
MCHC: 32 g/dL (ref 30.0–36.0)
MCV: 97.8 fL (ref 78.0–100.0)
Monocytes Absolute: 1.2 10*3/uL — ABNORMAL HIGH (ref 0.1–1.0)
Monocytes Relative: 10 % (ref 3–12)
NEUTROS ABS: 8.7 10*3/uL — AB (ref 1.7–7.7)
Neutrophils Relative %: 70 % (ref 43–77)
Platelets: 213 10*3/uL (ref 150–400)
RBC: 4.09 MIL/uL — AB (ref 4.22–5.81)
RDW: 13.4 % (ref 11.5–15.5)
WBC: 12.4 10*3/uL — ABNORMAL HIGH (ref 4.0–10.5)

## 2014-03-02 LAB — CBG MONITORING, ED: GLUCOSE-CAPILLARY: 166 mg/dL — AB (ref 70–99)

## 2014-03-02 MED ORDER — INSULIN ASPART 100 UNIT/ML ~~LOC~~ SOLN
0.0000 [IU] | SUBCUTANEOUS | Status: DC
  Administered 2014-03-02: 3 [IU] via SUBCUTANEOUS
  Administered 2014-03-03: 5 [IU] via SUBCUTANEOUS
  Administered 2014-03-03: 3 [IU] via SUBCUTANEOUS
  Filled 2014-03-02: qty 1

## 2014-03-02 MED ORDER — DULOXETINE HCL 30 MG PO CPEP
30.0000 mg | ORAL_CAPSULE | Freq: Every evening | ORAL | Status: DC
  Administered 2014-03-03 – 2014-03-06 (×4): 30 mg via ORAL
  Filled 2014-03-02 (×6): qty 1

## 2014-03-02 MED ORDER — POLYETHYLENE GLYCOL 3350 17 G PO PACK: 17.0000 g | PACK | Freq: Every day | ORAL | Status: DC | PRN

## 2014-03-02 MED ORDER — ACETAMINOPHEN 325 MG PO TABS
650.0000 mg | ORAL_TABLET | Freq: Four times a day (QID) | ORAL | Status: DC | PRN
  Administered 2014-03-03 – 2014-03-05 (×3): 650 mg via ORAL
  Filled 2014-03-02 (×3): qty 2

## 2014-03-02 MED ORDER — LEVOTHYROXINE SODIUM 50 MCG PO TABS: 50.0000 ug | ORAL_TABLET | Freq: Every day | ORAL | Status: DC

## 2014-03-02 MED ORDER — ONDANSETRON HCL 4 MG PO TABS: 4.0000 mg | ORAL_TABLET | Freq: Four times a day (QID) | ORAL | Status: DC | PRN

## 2014-03-02 MED ORDER — OXYCODONE-ACETAMINOPHEN 5-325 MG PO TABS
1.0000 | ORAL_TABLET | Freq: Once | ORAL | Status: AC
  Administered 2014-03-02: 1 via ORAL
  Filled 2014-03-02: qty 1

## 2014-03-02 MED ORDER — ENOXAPARIN SODIUM 40 MG/0.4ML ~~LOC~~ SOLN
40.0000 mg | Freq: Every day | SUBCUTANEOUS | Status: DC
  Administered 2014-03-03 – 2014-03-07 (×5): 40 mg via SUBCUTANEOUS
  Filled 2014-03-02 (×5): qty 0.4

## 2014-03-02 MED ORDER — DILTIAZEM HCL ER COATED BEADS 180 MG PO CP24
180.0000 mg | ORAL_CAPSULE | Freq: Every day | ORAL | Status: DC
  Administered 2014-03-03 – 2014-03-07 (×5): 180 mg via ORAL
  Filled 2014-03-02 (×5): qty 1

## 2014-03-02 MED ORDER — LORAZEPAM 1 MG PO TABS
1.0000 mg | ORAL_TABLET | Freq: Once | ORAL | Status: AC
  Administered 2014-03-02: 1 mg via ORAL
  Filled 2014-03-02: qty 1

## 2014-03-02 MED ORDER — ACETAMINOPHEN 650 MG RE SUPP: 650.0000 mg | Freq: Four times a day (QID) | RECTAL | Status: DC | PRN

## 2014-03-02 MED ORDER — GADOBENATE DIMEGLUMINE 529 MG/ML IV SOLN
20.0000 mL | Freq: Once | INTRAVENOUS | Status: AC | PRN
  Administered 2014-03-02: 20 mL via INTRAVENOUS

## 2014-03-02 MED ORDER — VITAMIN D3 25 MCG (1000 UNIT) PO TABS
2000.0000 [IU] | ORAL_TABLET | Freq: Every day | ORAL | Status: DC
Start: 2014-03-03 — End: 2014-03-07
  Administered 2014-03-03 – 2014-03-07 (×5): 2000 [IU] via ORAL
  Filled 2014-03-02 (×5): qty 2

## 2014-03-02 MED ORDER — BISACODYL 10 MG RE SUPP: 10.0000 mg | Freq: Every day | RECTAL | Status: DC | PRN

## 2014-03-02 MED ORDER — METFORMIN HCL 500 MG PO TABS
500.0000 mg | ORAL_TABLET | Freq: Two times a day (BID) | ORAL | Status: DC
  Administered 2014-03-03 – 2014-03-07 (×9): 500 mg via ORAL
  Filled 2014-03-02 (×11): qty 1

## 2014-03-02 MED ORDER — LATANOPROST 0.005 % OP SOLN
1.0000 [drp] | Freq: Every evening | OPHTHALMIC | Status: DC
  Administered 2014-03-03 – 2014-03-06 (×4): 1 [drp] via OPHTHALMIC
  Filled 2014-03-02: qty 2.5

## 2014-03-02 MED ORDER — ATORVASTATIN CALCIUM 10 MG PO TABS
10.0000 mg | ORAL_TABLET | Freq: Every day | ORAL | Status: DC
Start: 2014-03-03 — End: 2014-03-07
  Administered 2014-03-03 – 2014-03-06 (×4): 10 mg via ORAL
  Filled 2014-03-02 (×6): qty 1

## 2014-03-02 MED ORDER — DIAZEPAM 5 MG PO TABS
5.0000 mg | ORAL_TABLET | Freq: Once | ORAL | Status: AC
  Administered 2014-03-02: 5 mg via ORAL
  Filled 2014-03-02: qty 1

## 2014-03-02 MED ORDER — METHYLPREDNISOLONE SODIUM SUCC 125 MG IJ SOLR
60.0000 mg | Freq: Four times a day (QID) | INTRAMUSCULAR | Status: DC
  Administered 2014-03-02 – 2014-03-05 (×11): 60 mg via INTRAVENOUS
  Filled 2014-03-02 (×6): qty 0.96
  Filled 2014-03-02: qty 2
  Filled 2014-03-02 (×3): qty 0.96
  Filled 2014-03-02: qty 2
  Filled 2014-03-02 (×3): qty 0.96
  Filled 2014-03-02 (×3): qty 2

## 2014-03-02 MED ORDER — ALPRAZOLAM 0.25 MG PO TABS
0.2500 mg | ORAL_TABLET | Freq: Every day | ORAL | Status: DC
  Administered 2014-03-02 – 2014-03-06 (×5): 0.25 mg via ORAL
  Filled 2014-03-02 (×5): qty 1

## 2014-03-02 MED ORDER — ZOLPIDEM TARTRATE 5 MG PO TABS: 5.0000 mg | ORAL_TABLET | Freq: Every evening | ORAL | Status: DC | PRN

## 2014-03-02 MED ORDER — ASPIRIN EC 81 MG PO TBEC
81.0000 mg | DELAYED_RELEASE_TABLET | Freq: Every day | ORAL | Status: DC
  Administered 2014-03-03 – 2014-03-07 (×5): 81 mg via ORAL
  Filled 2014-03-02 (×5): qty 1

## 2014-03-02 MED ORDER — ALUM & MAG HYDROXIDE-SIMETH 200-200-20 MG/5ML PO SUSP: 30.0000 mL | Freq: Four times a day (QID) | ORAL | Status: DC | PRN

## 2014-03-02 MED ORDER — POTASSIUM CHLORIDE IN NACL 20-0.9 MEQ/L-% IV SOLN
INTRAVENOUS | Status: DC
  Administered 2014-03-03 – 2014-03-05 (×5): via INTRAVENOUS
  Filled 2014-03-02 (×8): qty 1000

## 2014-03-02 MED ORDER — PANTOPRAZOLE SODIUM 40 MG PO TBEC
40.0000 mg | DELAYED_RELEASE_TABLET | Freq: Every day | ORAL | Status: DC
  Administered 2014-03-03 – 2014-03-07 (×5): 40 mg via ORAL
  Filled 2014-03-02 (×4): qty 1

## 2014-03-02 MED ORDER — ONDANSETRON HCL 4 MG/2ML IJ SOLN: 4.0000 mg | Freq: Four times a day (QID) | INTRAMUSCULAR | Status: DC | PRN

## 2014-03-02 MED ORDER — MORPHINE SULFATE 2 MG/ML IJ SOLN
2.0000 mg | INTRAMUSCULAR | Status: DC | PRN
  Administered 2014-03-03 – 2014-03-07 (×6): 2 mg via INTRAVENOUS
  Filled 2014-03-02 (×6): qty 1

## 2014-03-02 MED ORDER — ALBUTEROL SULFATE (2.5 MG/3ML) 0.083% IN NEBU: 2.5000 mg | INHALATION_SOLUTION | Freq: Four times a day (QID) | RESPIRATORY_TRACT | Status: DC | PRN

## 2014-03-02 MED ORDER — POLYETHYLENE GLYCOL 3350 17 G PO PACK
17.0000 g | PACK | Freq: Every day | ORAL | Status: DC
  Administered 2014-03-06 – 2014-03-07 (×2): 17 g via ORAL
  Filled 2014-03-02 (×6): qty 1

## 2014-03-02 NOTE — ED Notes (Signed)
MD at bedside. 

## 2014-03-02 NOTE — H&P (Signed)
Taylor [name] is an 81 y.o. male.   Chief Complaint: severe low back pain and left leg weakness HPI:  Exam patient is an 81 year old Caucasian man with multiple medical problems who presented to the emergency department today because of gradually worsening pain in the low back radiating to the left lower extremity over the past month. Prior to this past month he was very functional and able to independently take care of himself and drive without difficulty. Over the past month he's had progressively increasing pain in the back that is now severe and worse with standing. With attempts to stand and use a walker he has been dragging his left leg because of weakness and pain. He has been followed by an orthopedist, Dr. Mayer Camel, who reportedly did recent x-rays of the lumbar spine that showed osteoarthritis and degenerative disc disease. He treated him with brief pulse prednisone treatment along with physical therapy which has not helped his chest discomfort. He has not had problems with fever, chills, incontinence, or known history of metastatic carcinoma. He has a history of bladder cancer that has been well controlled with last cystoscopy about one month ago. Workup in the emergency department is most notable for an MRI scan of the lumbar spine with contrast that shows a 10 mm lesion in the S1 vertebral body that enhances with gadolinium. There was some evidence of degenerative disc disease in the lower lumbar spine as well, with no evidence of an abscess or critical spinal stenosis. Because he cannot walk safely he is being admitted for acute treatment and pain management. Past Medical History  Diagnosis Date  . Hypertension   . Hyperlipidemia   . Hypothyroidism   . COPD (chronic obstructive pulmonary disease)   . Atrial fibrillation   . Glaucoma   . Anxiety   . History of bladder cancer   . Lung nodule     SMALL  . OSA (obstructive sleep apnea)      (Not in a hospital admission)  ADDITIONAL  HOME MEDICATIONS: No additional home medications  PHYSICIANS INVOLVED IN CARE: Reynold Bowen (primary care), Kathie Rhodes (urology), Dr. Mayer Camel (orthopedics), Dr. Rennis Harding (spine surgery), Ruta Hinds (vsurg)  Past Surgical History  Procedure Laterality Date  . Transurethral resection of prostate    . Cardiovascular stress test  12/25/2007    EF 64%. NO EVIDENCE OF ISCHEMIA  . Back surgery  10-18-11  . Cystoscopy N/A 12/15    Family History  Problem Relation Age of Onset  . Lung cancer      was never a smoker     Social History:  reports that he quit smoking about 3 years ago. His smoking use included Cigarettes. He has a 90 pack-year smoking history. He has never used smokeless tobacco. He reports that he does not drink alcohol or use illicit drugs. he is a widower for the second time and lives with his daughter who is away during daytime because of work obligations. He has a long history of tobacco abuse that was stopped about 3 years ago and no history of alcohol abuse.  Allergies:  Allergies  Allergen Reactions  . Amoxicillin Palpitations  . Motrin [Ibuprofen] Palpitations  . Chantix [Varenicline Tartrate] Other (See Comments)    depression     ROS: ankle swelling, arthritis, cancer/tumor, diabetes, emphysema, Heart disease, heart palpitation and high blood pressure  PHYSICAL EXAM: Blood pressure 115/48, pulse 69, temperature 98.1 F (36.7 C), temperature source Oral, resp. rate 14, height 5\' 7"  (1.702  m), weight 103.42 kg (228 lb), SpO2 95 %. In general, he is an overweight white man who was in no apparent distress while sitting upright in bed. HEENT exam was within normal limits, neck was supple without jugular venous distention or carotid bruit, chest was clear to auscultation, heart had a regular rate and rhythm without significant murmur or gallop, abdomen had normal bowel sounds and no hepatosplenomegaly or tenderness, he had bilateral 1+ pedal pulses (left greater  than right). He was alert and well oriented with normal affect, cranial nerves II through XII are normal, he had intact light touch sensation in both lower extremities. He had 4-5 left dorsiflexion and plantar flexion strength and 5 out of 5 right dorsiflexion and plantar flexion strength. Gait was not reassessed in the emergency room although he did have some dragging of his left lower extremity previously with ambulating with walker  Results for orders placed or performed during the hospital encounter of 03/02/14 (from the past 48 hour(s))  CBC with Differential     Status: Abnormal   Collection Time: 03/02/14  2:11 PM  Result Value Ref Range   WBC 12.4 (H) 4.0 - 10.5 K/uL   RBC 4.09 (L) 4.22 - 5.81 MIL/uL   Hemoglobin 12.8 (L) 13.0 - 17.0 g/dL   HCT 40.0 39.0 - 52.0 %   MCV 97.8 78.0 - 100.0 fL   MCH 31.3 26.0 - 34.0 pg   MCHC 32.0 30.0 - 36.0 g/dL   RDW 13.4 11.5 - 15.5 %   Platelets 213 150 - 400 K/uL   Neutrophils Relative % 70 43 - 77 %   Neutro Abs 8.7 (H) 1.7 - 7.7 K/uL   Lymphocytes Relative 16 12 - 46 %   Lymphs Abs 2.0 0.7 - 4.0 K/uL   Monocytes Relative 10 3 - 12 %   Monocytes Absolute 1.2 (H) 0.1 - 1.0 K/uL   Eosinophils Relative 4 0 - 5 %   Eosinophils Absolute 0.4 0.0 - 0.7 K/uL   Basophils Relative 0 0 - 1 %   Basophils Absolute 0.1 0.0 - 0.1 K/uL  I-Stat Chem 8, ED     Status: Abnormal   Collection Time: 03/02/14  2:21 PM  Result Value Ref Range   Sodium 139 135 - 145 mmol/L   Potassium 5.2 (H) 3.5 - 5.1 mmol/L   Chloride 98 96 - 112 mmol/L   BUN 22 6 - 23 mg/dL   Creatinine, Ser 0.90 0.50 - 1.35 mg/dL   Glucose, Bld 122 (H) 70 - 99 mg/dL   Calcium, Ion 1.25 1.13 - 1.30 mmol/L   TCO2 28 0 - 100 mmol/L   Hemoglobin 14.6 13.0 - 17.0 g/dL   HCT 43.0 39.0 - 52.0 %   Mr Lumbar Spine W Wo Contrast  03/02/2014   CLINICAL DATA:  Low back pain extending into the left lower extremity. Pain beginning 4 weeks ago. Unable to walk over the last 2 days.  EXAM: MRI LUMBAR  SPINE WITHOUT AND WITH CONTRAST  TECHNIQUE: Multiplanar and multiecho pulse sequences of the lumbar spine were obtained without and with intravenous contrast.  CONTRAST:  62mL MULTIHANCE GADOBENATE DIMEGLUMINE 529 MG/ML IV SOLN  COMPARISON:  CT of the lumbar spine 11/08/2010. MRI of the lumbar spine 03/29/2010.  FINDINGS: Dextro convex scoliosis of the lumbar spine is centered at L3. Chronic endplate marrow changes are present on the left at L3-4 and to a lesser extent at L2-3. 11 mm peripherally enhancing lesion is new in  the posterior aspect of the S1 vertebral body. No other focal marrow enhancement is evident.  Bilateral renal cysts are similar to the prior exams. No other significant abdominal lesion is present. There is no significant adenopathy.  T12-L1:  Negative.  L1-2: A shallow central disc protrusion is present. There is no significant stenosis.  L2-3: A leftward disc protrusion is present. Asymmetric left-sided facet hypertrophy is noted. There is progressive left subarticular and foraminal stenosis. The right foramen remains patent.  L3-4: A wide laminectomy is noted. A leftward disc protrusion remains. Mild left foraminal stenosis is unchanged.  L4-5: A rightward disc protrusion is present. The patient is status post laminectomy. Mild subarticular stenosis is similar to the prior study, worse on the right. Mild foraminal narrowing is also worse on the right.  L5-S1: The patient is status post laminectomy. Progressive advanced facet hypertrophy is noted on the right. Enhancement in the epidural space on the right likely represents granulation tissue. There is no significant stenosis.  IMPRESSION: 1. New 10 mm enhancing lesion within the posterior body of S1. This raises concern for a focal metastasis. Whole-body bone scan may be used for further evaluation of the entire skeleton. 2. Interval laminectomy at L5 with cord progressive facet hypertrophy and expected epidural granulation tissue. 3.  Progressive left subarticular and foraminal stenosis at L2-3 secondary to a progressive leftward disc protrusion. 4. Similar appearance of laminectomy and mild left foraminal stenosis at L3-4. 5. Similar appearance of laminectomy with mild subarticular and foraminal narrowing bilaterally at L4-5, worse on the right.   Electronically Signed   By: Lawrence Santiago M.D.   On: 03/02/2014 18:36     Assessment/Plan #1 Low Back Pain and Left Lower Extremity Weakness:  most likely the pain and weakness is from metastatic disease in the sacral spine versus in the pelvis itself. We will check a whole-body bone scan tomorrow for further evaluation. We will treat him with high-dose IV corticosteroids as well as IV morphine as needed. We will have a physical therapist evaluate his ability to safely ambulate tomorrow.  #2 diabetes mellitus, type II: stable and we will add sliding scale insulin #3 Bladder Cancer: stable with recent cystoscopy reportedly unremarkable #4 HTN; blood pressures are low so meds will be adjusted accordingly.   Trejuan Matherne G 03/02/2014, 11:23 PM

## 2014-03-02 NOTE — ED Notes (Signed)
Gave pt drink and food per MD ok.

## 2014-03-02 NOTE — ED Provider Notes (Signed)
CSN: [id number]     Arrival date & time 03/02/14  3086 History   First MD Initiated Contact with Patient 03/02/14 [phone]     Chief Complaint  Patient presents with  . Leg Pain     (Consider location/radiation/quality/duration/timing/severity/associated sxs/prior Treatment) HPI Taylor [name] is a 81 y.o. male with history of htn, COPD, afib, chronic back pain with prior surgeries, presents to ED complaining of left leg pain. Pt states pain started about 4 wks ago. Has been seen by his PCP and by Dr. Mayer Camel with orthopedics. PCP performed venous doppler which showed no clots. Dr. Mayer Camel tried pt on prednisone pack, which gave him no relief, and started him on PT. Pt's first session was 2 days ago. Pt states he had x-ray of the back done which showed severe arthritis. Pt states he has hx of similar left leg pain, just not as severe, which was related to herniated lumbar disk which was operated on and his symptoms has improved over last 2 years. Pt's daughter states that pt's pain is worsening, and he has not been able to get out of the house other than for PT over last week. Since 2 days ago, pt unable to walk.  Patient denies any numbness or weakness in his legs. He denies any new injuries or falls. He denies any bladder or bowel incontinence. He denies abdominal pain. He called back to Dr. Damita Dunnings office several days ago, asking for pain medications, he was called then tramadol which he has been taking but it's providing him no relief.  Past Medical History  Diagnosis Date  . Hypertension   . Hyperlipidemia   . Hypothyroidism   . COPD (chronic obstructive pulmonary disease)   . Atrial fibrillation   . Glaucoma   . Anxiety   . History of bladder cancer   . Lung nodule     SMALL  . OSA (obstructive sleep apnea)    Past Surgical History  Procedure Laterality Date  . Transurethral resection of prostate    . Cardiovascular stress test  12/25/2007    EF 64%. NO EVIDENCE OF ISCHEMIA  . Back  surgery  10-18-11   Family History  Problem Relation Age of Onset  . Lung cancer      was never a smoker   History  Substance Use Topics  . Smoking status: Former Smoker -- 1.50 packs/day for 60 years    Types: Cigarettes    Quit date: 11/27/2010  . Smokeless tobacco: Never Used  . Alcohol Use: No    Review of Systems  Constitutional: Negative for fever and chills.  Respiratory: Negative for cough, chest tightness and shortness of breath.   Cardiovascular: Negative for chest pain, palpitations and leg swelling.  Gastrointestinal: Negative for nausea, vomiting, abdominal pain, diarrhea and abdominal distention.  Genitourinary: Negative for dysuria, urgency, frequency, hematuria and difficulty urinating.  Musculoskeletal: Positive for back pain and arthralgias. Negative for neck pain and neck stiffness.  Skin: Negative for rash.  Neurological: Negative for weakness and headaches.      Allergies  Chantix  Home Medications   Prior to Admission medications   Medication Sig Start Date End Date Taking? Authorizing Provider  acetaminophen (TYLENOL) 325 MG tablet Take 650 mg by mouth 3 (three) times daily.     Historical Provider, MD  albuterol (PROVENTIL) (2.5 MG/3ML) 0.083% nebulizer solution Take 3 mLs (2.5 mg total) by nebulization every 6 (six) hours as needed for wheezing or shortness of breath. 12/23/13  Juanito Doom, MD  ALPRAZolam Duanne Moron) 0.25 MG tablet Take 0.25 mg by mouth at bedtime as needed.      Historical Provider, MD  aspirin 81 MG tablet Take 81 mg by mouth daily.      Historical Provider, MD  atorvastatin (LIPITOR) 10 MG tablet Take 10 mg by mouth daily.      Historical Provider, MD  cholecalciferol (VITAMIN D) 1000 UNITS tablet Take 2,000 Units by mouth daily.     Historical Provider, MD  diltiazem (CARDIZEM CD) 180 MG 24 hr capsule Take 180 mg by mouth daily.      Historical Provider, MD  DULoxetine (CYMBALTA) 30 MG capsule Take 30 mg by mouth daily.     Historical Provider, MD  fluticasone (FLONASE) 50 MCG/ACT nasal spray Place 2 sprays into the nose daily.      Historical Provider, MD  latanoprost (XALATAN) 0.005 % ophthalmic solution 1 drop at bedtime.      Historical Provider, MD  levothyroxine (SYNTHROID, LEVOTHROID) 100 MCG tablet Take 100 mcg by mouth daily before breakfast.     Historical Provider, MD  metFORMIN (GLUCOPHAGE) 500 MG tablet Take 500 mg by mouth daily with breakfast.    Historical Provider, MD  omeprazole (PRILOSEC) 20 MG capsule Take 20 mg by mouth daily.      Historical Provider, MD  OXYGEN-HELIUM IN Inhale 2.5 L into the lungs. Liquid and a concentrator    Historical Provider, MD  polyethylene glycol (MIRALAX / GLYCOLAX) packet Take 17 g by mouth daily.      Historical Provider, MD   BP 111/54 mmHg  Pulse 74  Temp(Src) 98.1 F (36.7 C) (Oral)  Resp 15  Ht 5\' 7"  (1.702 m)  Wt 228 lb (103.42 kg)  BMI 35.70 kg/m2  SpO2 96% Physical Exam  Constitutional: He appears well-developed and well-nourished. No distress.  HENT:  Head: Normocephalic and atraumatic.  Eyes: Conjunctivae are normal.  Neck: Neck supple.  Cardiovascular: Normal rate, regular rhythm and normal heart sounds.   Pulmonary/Chest: Effort normal. No respiratory distress. He has no wheezes. He has no rales.  Abdominal: Soft. Bowel sounds are normal. He exhibits no distension. There is no tenderness. There is no rebound.  Musculoskeletal: He exhibits no edema.   No tenderness over midline or paravertebral lumbar spine area. Pain with left straight leg raise. Leg appears to be normal, multiple varicose veins. No tenderness to palpation over the thigh, knee, calf, ankle or foot. DP and PT pulses intact and equal bilaterally. Refill is 2 seconds distally.  Neurological: He is alert.  Skin: Skin is warm and dry.  Nursing note and vitals reviewed.   ED Course  Procedures (including critical care time) Labs Review Labs Reviewed  CBC WITH  DIFFERENTIAL/PLATELET - Abnormal; Notable for the following:    WBC 12.4 (*)    RBC 4.09 (*)    Hemoglobin 12.8 (*)    Neutro Abs 8.7 (*)    Monocytes Absolute 1.2 (*)    All other components within normal limits  I-STAT CHEM 8, ED - Abnormal; Notable for the following:    Potassium 5.2 (*)    Glucose, Bld 122 (*)    All other components within normal limits    Imaging Review No results found.   EKG Interpretation None      MDM   Final diagnoses:  Back pain   Pt with ongoing left leg pain. Suspected lumbar radiculopathy. Negative venous doppler outpatient. Pulses intact. No neurodeficits. No loss  of bladder or bowel control. 2 pir[place] lumbar [place]. Will try to get pain under control with goal to have pt walking.    Pt received 1 percocet and valium. Pain improved, attempted to ambulate. Pt was only able to take 2 steps with a walker, pt is dragging left foot, not sure if due to pain vs weakness. Pain back to 10/10. Another percocet ordered.   Daughter concerned that pt may fall at home and that she is unable to help him to get around. Discussed with Dr. Maryan Rued, who has seen pt. Will get MRI of lumbar spine.   I also called and spoke with Dr. Forde Dandy, pt's PCP. I believe pt will need admission for pain control and safety issues with possible placement into rehab, given he has been bed bound for several days. Daughter concerned that symptoms are worsening instead of improving. He asked me to call back once MRI is back.    Pt signed out to Dr. Torrie Mayers pending MRI. Plan to call Dr. Forde Dandy after it is back.   Renold Genta, PA-C 03/02/14 1542  Blanchie Dessert, MD 03/04/14 [phone]

## 2014-03-02 NOTE — ED Notes (Signed)
Ambulated pt with walker. Pt was dragging his left foot. States that it has been bothering him for 3 weeks.

## 2014-03-02 NOTE — ED Notes (Signed)
Patient reports onset of pain in left foot 4 weeks ago.  The pain has increased and is in his leg.  Patient has seen his MD and took tramadol and prednisone for pain.  He also had a PT referral on Monday.  He has new exercises.  Patient did have vascular doppler when sx started per the family to r/o blood clot.  Patient has taken tylenol 1000mg  and tramadol today for pain

## 2014-03-02 NOTE — ED Notes (Signed)
CBG 166.Marland KitchenMarland KitchenMarland KitchenNurse notified

## 2014-03-03 ENCOUNTER — Inpatient Hospital Stay (HOSPITAL_COMMUNITY): Payer: Medicare Other

## 2014-03-03 ENCOUNTER — Encounter (HOSPITAL_COMMUNITY): Payer: Medicare Other

## 2014-03-03 LAB — CBC
HCT: 40.1 % (ref 39.0–52.0)
Hemoglobin: 12.7 g/dL — ABNORMAL LOW (ref 13.0–17.0)
MCH: 31 pg (ref 26.0–34.0)
MCHC: 31.7 g/dL (ref 30.0–36.0)
MCV: 97.8 fL (ref 78.0–100.0)
Platelets: 206 10*3/uL (ref 150–400)
RBC: 4.1 MIL/uL — ABNORMAL LOW (ref 4.22–5.81)
RDW: 13.4 % (ref 11.5–15.5)
WBC: 11.1 10*3/uL — AB (ref 4.0–10.5)

## 2014-03-03 LAB — COMPREHENSIVE METABOLIC PANEL
ALT: 13 U/L (ref 0–53)
ANION GAP: 7 (ref 5–15)
AST: 16 U/L (ref 0–37)
Albumin: 3.5 g/dL (ref 3.5–5.2)
Alkaline Phosphatase: 71 U/L (ref 39–117)
BUN: 16 mg/dL (ref 6–23)
CO2: 29 mmol/L (ref 19–32)
CREATININE: 0.89 mg/dL (ref 0.50–1.35)
Calcium: 9.4 mg/dL (ref 8.4–10.5)
Chloride: 101 mmol/L (ref 96–112)
GFR calc non Af Amer: 77 mL/min — ABNORMAL LOW (ref >90)
GFR, EST AFRICAN AMERICAN: 89 mL/min — AB (ref >90)
GLUCOSE: 249 mg/dL — AB (ref 70–99)
POTASSIUM: 5 mmol/L (ref 3.5–5.1)
Sodium: 137 mmol/L (ref 135–145)
Total Bilirubin: 0.6 mg/dL (ref 0.3–1.2)
Total Protein: 7.2 g/dL (ref 6.0–8.3)

## 2014-03-03 LAB — GLUCOSE, CAPILLARY
Glucose-Capillary: 205 mg/dL — ABNORMAL HIGH (ref 70–99)
Glucose-Capillary: 198 mg/dL — ABNORMAL HIGH (ref 70–99)
Glucose-Capillary: 173 mg/dL — ABNORMAL HIGH (ref 70–99)
Glucose-Capillary: 220 mg/dL — ABNORMAL HIGH (ref 70–99)
Glucose-Capillary: 212 mg/dL — ABNORMAL HIGH (ref 70–99)
Glucose-Capillary: 206 mg/dL — ABNORMAL HIGH (ref 70–99)

## 2014-03-03 MED ORDER — INSULIN ASPART 100 UNIT/ML ~~LOC~~ SOLN
0.0000 [IU] | Freq: Every day | SUBCUTANEOUS | Status: DC
  Administered 2014-03-03: 2 [IU] via SUBCUTANEOUS

## 2014-03-03 MED ORDER — LEVOTHYROXINE SODIUM 100 MCG PO TABS
100.0000 ug | ORAL_TABLET | ORAL | Status: DC
  Administered 2014-03-03 – 2014-03-05 (×3): 100 ug via ORAL
  Filled 2014-03-03 (×5): qty 1

## 2014-03-03 MED ORDER — INSULIN ASPART 100 UNIT/ML ~~LOC~~ SOLN
3.0000 [IU] | Freq: Three times a day (TID) | SUBCUTANEOUS | Status: DC
  Administered 2014-03-03 – 2014-03-07 (×13): 3 [IU] via SUBCUTANEOUS

## 2014-03-03 MED ORDER — LEVOTHYROXINE SODIUM 50 MCG PO TABS
50.0000 ug | ORAL_TABLET | ORAL | Status: DC
  Administered 2014-03-06 – 2014-03-07 (×2): 50 ug via ORAL
  Filled 2014-03-03 (×2): qty 1

## 2014-03-03 MED ORDER — INSULIN DETEMIR 100 UNIT/ML ~~LOC~~ SOLN
10.0000 [IU] | Freq: Every day | SUBCUTANEOUS | Status: DC
  Administered 2014-03-03 – 2014-03-06 (×4): 10 [IU] via SUBCUTANEOUS
  Filled 2014-03-03 (×6): qty 0.1

## 2014-03-03 MED ORDER — TECHNETIUM TC 99M MEDRONATE IV KIT
25.0000 | PACK | Freq: Once | INTRAVENOUS | Status: AC | PRN
  Administered 2014-03-03: 25 via INTRAVENOUS

## 2014-03-03 MED ORDER — INSULIN ASPART 100 UNIT/ML ~~LOC~~ SOLN
0.0000 [IU] | Freq: Three times a day (TID) | SUBCUTANEOUS | Status: DC
  Administered 2014-03-03: 7 [IU] via SUBCUTANEOUS
  Administered 2014-03-03: 4 [IU] via SUBCUTANEOUS
  Administered 2014-03-04: 7 [IU] via SUBCUTANEOUS
  Administered 2014-03-04 – 2014-03-06 (×6): 4 [IU] via SUBCUTANEOUS
  Administered 2014-03-06 – 2014-03-07 (×3): 3 [IU] via SUBCUTANEOUS

## 2014-03-03 NOTE — Progress Notes (Signed)
Inpatient Diabetes Program Recommendations  AACE/ADA: New Consensus Statement on Inpatient Glycemic Control (2013)  Target Ranges:  Prepandial:   less than 140 mg/dL      Peak postprandial:   less than 180 mg/dL (1-2 hours)      Critically ill patients:  140 - 180 mg/dL   Results for DEMIR, TITSWORTH (MRN [id number]) as of 03/03/2014 07:39  Ref. Range 03/02/2014 23:54 03/03/2014 04:06  Glucose-Capillary Latest Range: 70-99 mg/dL 166 (H) 205 (H)    Diabetes history: DM2 Outpatient Diabetes medications: Metformin 500 mg BID Current orders for Inpatient glycemic control: Novolog 0-15 units Q4H, Metformin 500 mg BID  Inpatient Diabetes Program Recommendations Insulin - Basal: While inpatient and ordered steroids, may want to consider ordering low dose basal insulin. Recommend starting with Levemir 5 units daily and adjust accordingly. HgbA1C: Please consider ordering an A1C to evaluate glycemic control over the past 2-3 months.  Thanks, Barnie Alderman, RN, MSN, CCRN, CDE Diabetes Coordinator Inpatient Diabetes Program [phone] (Team Pager) [phone] ([facility] office) [phone] [facility] office)

## 2014-03-03 NOTE — Evaluation (Signed)
Physical Therapy Evaluation Patient Details Name: Taylor [name] MRN: [id number] DOB: 1928-10-11 Today's Date: 03/03/2014   History of Present Illness  Patient is an 81 year old Caucasian man with multiple medical problems who presented to the ED today because of gradually worsening pain in the low back radiating to the left LE over the past month. Prior to this past month he was very functional and able to independently take care of himself and drive without difficulty. Over the past month he's had progressively increasing pain in the back that is now severe and worse with standing. He has a history of bladder cancer that has been well controlled with last cystoscopy about one month ago. Workup in the emergency department is most notable for an MRI scan of the lumbar spine with contrast that shows a 10 mm lesion in the S1 vertebral body that enhances with gadolinium.  Clinical Impression  Pt admitted with above diagnosis. Pt currently with functional limitations due to the deficits listed below (see PT Problem List). At the time of PT eval pt was able to perform transfers and ambulation with close guard and min assist. Pt reports that he feels his function is slightly improved from presentation to the ED. Pt will benefit from skilled PT to increase their independence and safety with mobility to allow discharge to the venue listed below.       Follow Up Recommendations SNF;Supervision/Assistance - 24 hour    Equipment Recommendations  None recommended by PT    Recommendations for Other Services       Precautions / Restrictions Precautions Precautions: Fall Restrictions Weight Bearing Restrictions: No      Mobility  Bed Mobility Overal bed mobility: Needs Assistance Bed Mobility: Supine to Sit     Supine to sit: Min guard     General bed mobility comments: Pt was able to transition to EOB without physical assist. Increased time and heavy use of bed rails required.    Transfers Overall transfer level: Needs assistance Equipment used: Rolling walker (2 wheeled) Transfers: Sit to/from Stand Sit to Stand: Min assist         General transfer comment: Pt required assist to power-up to full standing position.   Ambulation/Gait Ambulation/Gait assistance: Min guard Ambulation Distance (Feet): 5 Feet Assistive device: Rolling walker (2 wheeled) Gait Pattern/deviations: Decreased stride length;Step-to pattern;Trunk flexed Gait velocity: Decreased Gait velocity interpretation: Below normal speed for age/gender General Gait Details: Pt was able to take steps from bed to chair. Pt obviously limited by pain, and was bearing most of his weight through UE's for support. Pt with a guarded step-to pattern, and was almost dragging his RLE along as he could not stabilize on the LLE appropriately.   Stairs            Wheelchair Mobility    Modified Rankin (Stroke Patients Only)       Balance Overall balance assessment: Needs assistance Sitting-balance support: Feet supported;No upper extremity supported Sitting balance-Leahy Scale: Fair     Standing balance support: Bilateral upper extremity supported;During functional activity Standing balance-Leahy Scale: Poor Standing balance comment: Requires UE support                             Pertinent Vitals/Pain Pain Assessment: Faces Faces Pain Scale: Hurts even more Pain Location: Low back, LLE Pain Intervention(s): Limited activity within patient's tolerance;Monitored during session;Repositioned    Home Living Family/patient expects to be discharged to:: Private residence  Living Arrangements: Children Available Help at Discharge: Family;Available PRN/intermittently Type of Home: House Home Access: Stairs to enter Entrance Stairs-Rails: None Entrance Stairs-Number of Steps: 2 Home Layout: One level Home Equipment: Walker - 2 wheels;Cane - single point      Prior Function  Level of Independence: Independent               Hand Dominance   Dominant Hand: Right    Extremity/Trunk Assessment   Upper Extremity Assessment: Defer to OT evaluation           Lower Extremity Assessment: LLE deficits/detail   LLE Deficits / Details: Pt reports radiating pain down his LLE. Decreased strength consistent with this.   Cervical / Trunk Assessment: Kyphotic  Communication   Communication: No difficulties  Cognition Arousal/Alertness: Awake/alert Behavior During Therapy: WFL for tasks assessed/performed Overall Cognitive Status: Within Functional Limits for tasks assessed                      General Comments      Exercises        Assessment/Plan    PT Assessment Patient needs continued PT services  PT Diagnosis Difficulty walking;Abnormality of gait;Acute pain   PT Problem List Decreased strength;Decreased range of motion;Decreased activity tolerance;Decreased balance;Decreased mobility;Decreased knowledge of use of DME;Decreased knowledge of precautions;Decreased safety awareness;Pain  PT Treatment Interventions DME instruction;Gait training;Stair training;Functional mobility training;Therapeutic activities;Therapeutic exercise;Neuromuscular re-education;Patient/family education   PT Goals (Current goals can be found in the Care Plan section) Acute Rehab PT Goals Patient Stated Goal: Find out what is causing his pain PT Goal Formulation: With patient/family Time For Goal Achievement: 03/17/14 Potential to Achieve Goals: Fair    Frequency Min 2X/week   Barriers to discharge Decreased caregiver support Daughter works from home however she states that she cannot be 24 hour support for the patient. She is not able to step away from her computer during "working hours".     Co-evaluation               End of Session Equipment Utilized During Treatment: Gait belt Activity Tolerance: Patient tolerated treatment well Patient left:  in chair;with call bell/phone within reach;with family/visitor present Nurse Communication: Mobility status         Time: 4327-6147 PT Time Calculation (min) (ACUTE ONLY): 47 min   Charges:   PT Evaluation $Initial PT Evaluation Tier I: 1 Procedure PT Treatments $Gait Training: 8-22 mins $Therapeutic Activity: 8-22 mins   PT G Codes:        Rolinda Roan 04/02/2014, 3:08 PM  Rolinda Roan, PT, DPT Acute Rehabilitation Services Pager: [phone]

## 2014-03-03 NOTE — ED Notes (Signed)
Report given to [facility] unit nurse , transported in stable condition , denies pain / respirations unlabored .

## 2014-03-03 NOTE — Progress Notes (Signed)
Subjective: Asleep and appears comfortable  Objective: Vital signs in last 24 hours: Temp:  [97.9 F (36.6 C)-98.1 F (36.7 C)] 97.9 F (36.6 C) (01/28 0619) Pulse Rate:  [55-75] 71 (01/28 0619) Resp:  [11-28] 16 (01/28 0619) BP: (79-130)/(36-85) 127/59 mmHg (01/28 0619) SpO2:  [86 %-99 %] 95 % (01/28 0619) Weight:  [103.42 kg (228 lb)-108.001 kg (238 lb 1.6 oz)] 108.001 kg (238 lb 1.6 oz) (01/28 0024) Weight change:    Intake/Output from previous day: 01/27 0701 - 01/28 0700 In: -  Out: 400 [Urine:400]   General appearance: fatigued and no distress Resp: clear to auscultation bilaterally Cardio: regular rate and rhythm Extremities: bilateral feet with normal warmth and no significant edema  Lab Results:  Recent Labs  03/02/14 1411 03/02/14 1421 03/03/14 0545  WBC 12.4*  --  11.1*  HGB 12.8* 14.6 12.7*  HCT 40.0 43.0 40.1  PLT 213  --  206   BMET  Recent Labs  03/02/14 1421 03/03/14 0545  NA 139 137  K 5.2* 5.0  CL 98 101  CO2  --  29  GLUCOSE 122* 249*  BUN 22 16  CREATININE 0.90 0.89  CALCIUM  --  9.4   CMET CMP     Component Value Date/Time   NA 137 03/03/2014 0545   K 5.0 03/03/2014 0545   CL 101 03/03/2014 0545   CO2 29 03/03/2014 0545   GLUCOSE 249* 03/03/2014 0545   BUN 16 03/03/2014 0545   CREATININE 0.89 03/03/2014 0545   CALCIUM 9.4 03/03/2014 0545   PROT 7.2 03/03/2014 0545   ALBUMIN 3.5 03/03/2014 0545   AST 16 03/03/2014 0545   ALT 13 03/03/2014 0545   ALKPHOS 71 03/03/2014 0545   BILITOT 0.6 03/03/2014 0545   GFRNONAA 77* 03/03/2014 0545   GFRAA 89* 03/03/2014 0545    CBG (last 3)   Recent Labs  03/02/14 2354 03/03/14 0406 03/03/14 0754  GLUCAP 166* 205* 198*    INR RESULTS:   Lab Results  Component Value Date   INR 0.9 12/14/2007     Studies/Results: Mr Lumbar Spine W Wo Contrast  03/02/2014   CLINICAL DATA:  Low back pain extending into the left lower extremity. Pain beginning 4 weeks ago. Unable to walk  over the last 2 days.  EXAM: MRI LUMBAR SPINE WITHOUT AND WITH CONTRAST  TECHNIQUE: Multiplanar and multiecho pulse sequences of the lumbar spine were obtained without and with intravenous contrast.  CONTRAST:  64mL MULTIHANCE GADOBENATE DIMEGLUMINE 529 MG/ML IV SOLN  COMPARISON:  CT of the lumbar spine 11/08/2010. MRI of the lumbar spine 03/29/2010.  FINDINGS: Dextro convex scoliosis of the lumbar spine is centered at L3. Chronic endplate marrow changes are present on the left at L3-4 and to a lesser extent at L2-3. 11 mm peripherally enhancing lesion is new in the posterior aspect of the S1 vertebral body. No other focal marrow enhancement is evident.  Bilateral renal cysts are similar to the prior exams. No other significant abdominal lesion is present. There is no significant adenopathy.  T12-L1:  Negative.  L1-2: A shallow central disc protrusion is present. There is no significant stenosis.  L2-3: A leftward disc protrusion is present. Asymmetric left-sided facet hypertrophy is noted. There is progressive left subarticular and foraminal stenosis. The right foramen remains patent.  L3-4: A wide laminectomy is noted. A leftward disc protrusion remains. Mild left foraminal stenosis is unchanged.  L4-5: A rightward disc protrusion is present. The patient is status post  laminectomy. Mild subarticular stenosis is similar to the prior study, worse on the right. Mild foraminal narrowing is also worse on the right.  L5-S1: The patient is status post laminectomy. Progressive advanced facet hypertrophy is noted on the right. Enhancement in the epidural space on the right likely represents granulation tissue. There is no significant stenosis.  IMPRESSION: 1. New 10 mm enhancing lesion within the posterior body of S1. This raises concern for a focal metastasis. Whole-body bone scan may be used for further evaluation of the entire skeleton. 2. Interval laminectomy at L5 with cord progressive facet hypertrophy and expected  epidural granulation tissue. 3. Progressive left subarticular and foraminal stenosis at L2-3 secondary to a progressive leftward disc protrusion. 4. Similar appearance of laminectomy and mild left foraminal stenosis at L3-4. 5. Similar appearance of laminectomy with mild subarticular and foraminal narrowing bilaterally at L4-5, worse on the right.   Electronically Signed   By: Lawrence Santiago M.D.   On: 03/02/2014 18:36    Medications: I have reviewed the patient's current medications.  Assessment/Plan: #1 Left Lower Extremity pain and weakness:  Unclear cause and he is at high risk for falls. We will continue moderate dose steroids and check whole body bone scan today. Will also check pelvis and left hip x-rays. Will have PT evaluate gait with walker. Continue IV morphine prn #2 DM2: sugars too high so will add levemir and increase SSI  LOS: 1 day   Taylor [name] 03/03/2014, 8:16 AM

## 2014-03-03 NOTE — Progress Notes (Signed)
UR completed.  Sahmir Weatherbee, RN BSN [facility] CCM Trauma/Neuro ICU Case Manager [phone]  

## 2014-03-04 LAB — GLUCOSE, CAPILLARY
GLUCOSE-CAPILLARY: 170 mg/dL — AB (ref 70–99)
Glucose-Capillary: 187 mg/dL — ABNORMAL HIGH (ref 70–99)
Glucose-Capillary: 213 mg/dL — ABNORMAL HIGH (ref 70–99)
Glucose-Capillary: 166 mg/dL — ABNORMAL HIGH (ref 70–99)

## 2014-03-04 NOTE — Progress Notes (Signed)
Physical Therapy Treatment Patient Details Name: RALPH BROUWER MRN: [id number] DOB: Apr 30, 1928 Today's Date: 03/04/2014    History of Present Illness Patient is an 81 year old Caucasian man with multiple medical problems who presented to the ED today because of gradually worsening pain in the low back radiating to the left LE over the past month. Prior to this past month he was very functional and able to independently take care of himself and drive without difficulty. Over the past month he's had progressively increasing pain in the back that is now severe and worse with standing. He has a history of bladder cancer that has been well controlled with last cystoscopy about one month ago. Workup in the emergency department is most notable for an MRI scan of the lumbar spine with contrast that shows a 10 mm lesion in the S1 vertebral body. Bone scan negative for metastasis. Pt with foraminal stenosis L2-3, L4-5    PT Comments    See below for functional mobility details. Daughter and patient do not feel patient can be managed in daughter's home (2 step entry with no railing, daughter works 17 hrs/week) at his current functional level. Pt ideally wants to receive steroid injections while an inpatient and if they improve his pain, then return home. However, if this is not an option, then he wants to go to a skilled nursing facility for continued therapy, injections, etc. so he can become more independent prior to returning to his daughter's home. Attempted to contact Dr. Sharlett Iles to make him aware of family/patient concerns and PT continues to recommend SNF. Dr. Sharlett Iles was with a patient and spoke with DJ who stated she was familiar with Mr. Conaway and would relay the information to Dr. Sharlett Iles and that they would assist him in SNF placement if that was the patient's wishes. Patient, daughter, and RN updated on conversations and PT recommendations.     Follow Up Recommendations  SNF;Supervision/Assistance - 24 hour     Equipment Recommendations  None recommended by PT    Recommendations for Other Services OT consult     Precautions / Restrictions Precautions Precautions: Fall Restrictions Weight Bearing Restrictions: No    Mobility  Bed Mobility Overal bed mobility: Needs Assistance Bed Mobility: Sit to Supine       Sit to supine: Supervision   General bed mobility comments: vc for technique to minimize back strain and center himself in the bed  Transfers Overall transfer level: Needs assistance Equipment used: Rolling walker (2 wheeled) Transfers: Sit to/from Stand Sit to Stand: Min guard         General transfer comment: vc for safe use of RW; repeated x 2  Ambulation/Gait Ambulation/Gait assistance: Min assist Ambulation Distance (Feet): 30 Feet Assistive device: Rolling walker (2 wheeled) Gait Pattern/deviations: Step-to pattern;Decreased stride length;Antalgic;Trunk flexed;Wide base of support Gait velocity: Decreased   General Gait Details: Pt leads with LLE (often only touching his toes/forefoot) with incr reliance on UE support via RW. Steady assist during turns and as pt stepping backwards to prepare to sit   Stairs Stairs:  (see general comments below)          Wheelchair Mobility    Modified Rankin (Stroke Patients Only)       Balance     Sitting balance-Leahy Scale: Fair       Standing balance-Leahy Scale: Poor                      Cognition Arousal/Alertness: Awake/alert Behavior  During Therapy: WFL for tasks assessed/performed Overall Cognitive Status: Within Functional Limits for tasks assessed                      Exercises      General Comments General comments (skin integrity, edema, etc.): Daughter present and upset that pt is to be sent home with her tomorrow. Lengthy discussion re: her schedule (works 40 hrs/week) and cannot provide pt with level of care he needs. She  especially is concerned re: two step entry into house which pt almost fell down as she was bringing him to the hospital. Discussed potential options for ascending steps (pt has no rail for support), and neither pt or daughter feel pt is capable of doing this with her assistance. Both are wanting him to go to SNF if he cannot remain an inpatient.       Pertinent Vitals/Pain Pain Assessment: 0-10 Pain Score: 7  Pain Location: LLE more than low back Pain Intervention(s): Limited activity within patient's tolerance;Monitored during session;Repositioned    Home Living                      Prior Function            PT Goals (current goals can now be found in the care plan section) Acute Rehab PT Goals Patient Stated Goal: Find out what is causing his pain Progress towards PT goals: Progressing toward goals    Frequency  Min 2X/week    PT Plan Current plan remains appropriate    Co-evaluation             End of Session Equipment Utilized During Treatment: Gait belt Activity Tolerance: Patient limited by pain Patient left: with call bell/phone within reach;in bed;with family/visitor present     Time: 1583-0940 PT Time Calculation (min) (ACUTE ONLY): 38 min  Charges:  $Gait Training: 23-37 mins $Therapeutic Activity: 8-22 mins                    G Codes:      Brianny Soulliere 03-25-2014, 4:46 PM Pager [phone]

## 2014-03-04 NOTE — Progress Notes (Signed)
Medicare IM (Important Message) delivered to patient today by me in anticipation of discharge.   Sandi Mariscal, RN BSN [facility] CCM  Case Manager, Trauma Service/Unit [facility] [phone]

## 2014-03-04 NOTE — Progress Notes (Signed)
Subjective: He feels that his weakness and pain in the left extremity has improved a small amount since coming into the hospital.  He is not having problems with constipation or shortness of breath.  He would like to be a bit stronger before returning to home where he lives with his daughter who is not home during daytime hours because of work obligations.  Objective: Vital signs in last 24 hours: Temp:  [97.4 F (36.3 C)-98.2 F (36.8 C)] 97.6 F (36.4 C) (01/29 1259) Pulse Rate:  [65-71] 71 (01/29 1259) Resp:  [18] 18 (01/29 1259) BP: (124-150)/(47-62) 124/47 mmHg (01/29 1259) SpO2:  [95 %-97 %] 97 % (01/29 1259) Weight change:    Intake/Output from previous day: 01/28 0701 - 01/29 0700 In: 1318.8 [P.O.:600; I.V.:718.8] Out: 2105 [Urine:2105]   General appearance: alert, cooperative and no distress Resp: clear to auscultation bilaterally Cardio: regular rate and rhythm GI: soft, non-tender; bowel sounds normal; no masses,  no organomegaly Extremities: extremities normal, atraumatic, no cyanosis or edema he was able to get to the side of his bed and independently change from a seated position to a standing position and a walker and walk a very short distance to a bedside chair.  He favored the lleft lower extremity  doing only toe-tip weight-bearing.  He had intact light touch sensation in both lower extremities and normal bilateral foot dorsiflexion and plantar flexion  Lab Results:  Recent Labs  03/02/14 1411 03/02/14 1421 03/03/14 0545  WBC 12.4*  --  11.1*  HGB 12.8* 14.6 12.7*  HCT 40.0 43.0 40.1  PLT 213  --  206   BMET  Recent Labs  03/02/14 1421 03/03/14 0545  NA 139 137  K 5.2* 5.0  CL 98 101  CO2  --  29  GLUCOSE 122* 249*  BUN 22 16  CREATININE 0.90 0.89  CALCIUM  --  9.4   CMET CMP     Component Value Date/Time   NA 137 03/03/2014 0545   K 5.0 03/03/2014 0545   CL 101 03/03/2014 0545   CO2 29 03/03/2014 0545   GLUCOSE 249* 03/03/2014 0545    BUN 16 03/03/2014 0545   CREATININE 0.89 03/03/2014 0545   CALCIUM 9.4 03/03/2014 0545   PROT 7.2 03/03/2014 0545   ALBUMIN 3.5 03/03/2014 0545   AST 16 03/03/2014 0545   ALT 13 03/03/2014 0545   ALKPHOS 71 03/03/2014 0545   BILITOT 0.6 03/03/2014 0545   GFRNONAA 77* 03/03/2014 0545   GFRAA 89* 03/03/2014 0545    CBG (last 3)   Recent Labs  03/03/14 2118 03/04/14 0745 03/04/14 1142  GLUCAP 206* 187* 170*    INR RESULTS:   Lab Results  Component Value Date   INR 0.9 12/14/2007     Studies/Results: Mr Lumbar Spine W Wo Contrast  03/02/2014   CLINICAL DATA:  Low back pain extending into the left lower extremity. Pain beginning 4 weeks ago. Unable to walk over the last 2 days.  EXAM: MRI LUMBAR SPINE WITHOUT AND WITH CONTRAST  TECHNIQUE: Multiplanar and multiecho pulse sequences of the lumbar spine were obtained without and with intravenous contrast.  CONTRAST:  68mL MULTIHANCE GADOBENATE DIMEGLUMINE 529 MG/ML IV SOLN  COMPARISON:  CT of the lumbar spine 11/08/2010. MRI of the lumbar spine 03/29/2010.  FINDINGS: Dextro convex scoliosis of the lumbar spine is centered at L3. Chronic endplate marrow changes are present on the left at L3-4 and to a lesser extent at L2-3. 11 mm peripherally enhancing  lesion is new in the posterior aspect of the S1 vertebral body. No other focal marrow enhancement is evident.  Bilateral renal cysts are similar to the prior exams. No other significant abdominal lesion is present. There is no significant adenopathy.  T12-L1:  Negative.  L1-2: A shallow central disc protrusion is present. There is no significant stenosis.  L2-3: A leftward disc protrusion is present. Asymmetric left-sided facet hypertrophy is noted. There is progressive left subarticular and foraminal stenosis. The right foramen remains patent.  L3-4: A wide laminectomy is noted. A leftward disc protrusion remains. Mild left foraminal stenosis is unchanged.  L4-5: A rightward disc protrusion  is present. The patient is status post laminectomy. Mild subarticular stenosis is similar to the prior study, worse on the right. Mild foraminal narrowing is also worse on the right.  L5-S1: The patient is status post laminectomy. Progressive advanced facet hypertrophy is noted on the right. Enhancement in the epidural space on the right likely represents granulation tissue. There is no significant stenosis.  IMPRESSION: 1. New 10 mm enhancing lesion within the posterior body of S1. This raises concern for a focal metastasis. Whole-body bone scan may be used for further evaluation of the entire skeleton. 2. Interval laminectomy at L5 with cord progressive facet hypertrophy and expected epidural granulation tissue. 3. Progressive left subarticular and foraminal stenosis at L2-3 secondary to a progressive leftward disc protrusion. 4. Similar appearance of laminectomy and mild left foraminal stenosis at L3-4. 5. Similar appearance of laminectomy with mild subarticular and foraminal narrowing bilaterally at L4-5, worse on the right.   Electronically Signed   By: Lawrence Santiago M.D.   On: 03/02/2014 18:36   Nm Bone Scan Whole Body  03/03/2014   CLINICAL DATA:  History of left hip knee and leg pain. Without history of trauma ; chronic low back pain. History of bladder malignancy  EXAM: NUCLEAR MEDICINE WHOLE BODY BONE SCAN  TECHNIQUE: Whole body anterior and posterior images were obtained approximately 3 hours after intravenous injection of radiopharmaceutical.  RADIOPHARMACEUTICALS:  Twenty-five mCi Technetium-99 MDP  COMPARISON:  Report of an outside nuclear bone scan dated April 10, 1999  FINDINGS: There is adequate uptake of the radiopharmaceutical by the skeleton. Adequate soft tissue clearance and renal activity is demonstrated. Uptake over the calvarium and pectoral girdle is normal. Activity over the thoracic spine is normal. There is stable reverse S-shaped scoliosis of the thoracolumbar spine. Uptake of the  radiopharmaceutical within the ribs, sternum, and pelvis is normal. There is normal uptake in the hips. There is mildly increased uptake in the left knee and minimal increased uptake in the right knee compatible with osteoarthritis.  IMPRESSION: 1. There is no abnormal uptake within the skull, spine, ribs, or pectoral girdle, or pelvis. 2. There is moderately increased uptake in the left knee with mildly increased uptake in the right knee most compatible with degenerative change. Plain films of the knees are recommended.   Electronically Signed   By: David  Martinique   On: 03/03/2014 16:47   Dg Hip Unilat With Pelvis Min 4 Views Left  03/03/2014   CLINICAL DATA:  Left hip pain for a few days.  No injury.  EXAM: DG HIP W/ PWLVIS 4+V*L*  COMPARISON:  None.  FINDINGS: Examination demonstrates diffuse decreased bone mineralization. There are mild symmetric degenerative changes of the hips. No acute fracture or dislocation. Moderate degenerative changes of the spine. Calcified plaque over the iliac and femoral arteries.  IMPRESSION: No acute findings.  Mild  symmetric degenerative changes of the hips. Diffuse decreased bone mineralization.   Electronically Signed   By: Marin Olp M.D.   On: 03/03/2014 09:15    Medications: I have reviewed the patient's current medications.  Assessment/Plan: #1 Left Lower Extremity pain and weakness: improved with high-dose IV corticosteroids and most likely from lumbar spine degenerative disc disease and osteoarthritis given findings from his lumbar spine MRI exam and whole-body on scan.  The bone scan was not suggestive of metastatic carcinoma. Given his improvement he will likely be strong enough for discharge to home tomorrow, to be set up for epidural steroid injections as an outpatient.  He can be treated with a tapering course of prednisone for now. #2 DM2:  Blood sugars are much improved on higher dose basal and sliding scale insulin. #3 HTN:  Well controlled on  medication.  LOS: 2 days   Setareh Rom G 03/04/2014, 1:33 PM

## 2014-03-05 LAB — GLUCOSE, CAPILLARY
GLUCOSE-CAPILLARY: 187 mg/dL — AB (ref 70–99)
Glucose-Capillary: 187 mg/dL — ABNORMAL HIGH (ref 70–99)
Glucose-Capillary: 153 mg/dL — ABNORMAL HIGH (ref 70–99)
Glucose-Capillary: 210 mg/dL — ABNORMAL HIGH (ref 70–99)

## 2014-03-05 LAB — HEMOGLOBIN A1C
Hgb A1c MFr Bld: 7.8 % — ABNORMAL HIGH (ref 4.8–5.6)
Mean Plasma Glucose: 177 mg/dL

## 2014-03-05 MED ORDER — PREDNISONE 10 MG PO TABS
60.0000 mg | ORAL_TABLET | Freq: Every day | ORAL | Status: DC
  Administered 2014-03-06 – 2014-03-07 (×2): 60 mg via ORAL
  Filled 2014-03-05 (×4): qty 1

## 2014-03-05 NOTE — Progress Notes (Signed)
Subjective: Sitting in his chair no apparent distress answering all questions appropriately, appreciated input from physical therapy, does still feel weak and pain in his left lower extremity, at risk for falls, possibly marginally improved since admission. He is very interested and a lumbar spine injection to see if this can help remedy the situation status post information from the MRI and bone scan. Daughter present, they are all in agreement, discussed that this would probably not be possible on the weekend, will pursue on Monday and possibly through skilled nursing facility as previously discussed extensively between physical therapy patient daughter as well as Dr. Sharlett Iles yesterday. Does not feel safe going home independently given that daughter works 40 hours a week.does agree that short-term nursing facility may be beneficial in order to facilitate spinal injection, rehabilitation process and transition back home with his daughter to live in a semi-independent fashion. Denies nausea vomiting anorexia focal neurologic deficits that are new, chest pain shortness of breath denies abdominal pain.  Objective: Vital signs in last 24 hours: Temp:  [97.6 F (36.4 C)-98.1 F (36.7 C)] 98.1 F (36.7 C) (01/30 0458) Pulse Rate:  [61-71] 61 (01/30 0458) Resp:  [17-18] 17 (01/30 0458) BP: (124-157)/(47-58) 129/49 mmHg (01/30 0458) SpO2:  [97 %] 97 % (01/30 0458) Weight change:    Intake/Output from previous day: 01/29 0701 - 01/30 0700 In: 2816.3 [P.O.:960; I.V.:1856.3] Out: 300 [Urine:300]   Moderately obese sitting in a bit chair in no apparent distress, and person, answering all questions appropriately No oropharyngeal lesions neck supple Clear to auscultation bilaterally Regular rate and rhythm Abdomen soft, nontender, nondistended No edema, pedal pulses intact no cyanosis Gait not assessed given significant workup by physical therapy  Lab Results:  Recent Labs  03/02/14 1411  03/02/14 1421 03/03/14 0545  WBC 12.4*  --  11.1*  HGB 12.8* 14.6 12.7*  HCT 40.0 43.0 40.1  PLT 213  --  206   BMET  Recent Labs  03/02/14 1421 03/03/14 0545  NA 139 137  K 5.2* 5.0  CL 98 101  CO2  --  29  GLUCOSE 122* 249*  BUN 22 16  CREATININE 0.90 0.89  CALCIUM  --  9.4   CMET CMP     Component Value Date/Time   NA 137 03/03/2014 0545   K 5.0 03/03/2014 0545   CL 101 03/03/2014 0545   CO2 29 03/03/2014 0545   GLUCOSE 249* 03/03/2014 0545   BUN 16 03/03/2014 0545   CREATININE 0.89 03/03/2014 0545   CALCIUM 9.4 03/03/2014 0545   PROT 7.2 03/03/2014 0545   ALBUMIN 3.5 03/03/2014 0545   AST 16 03/03/2014 0545   ALT 13 03/03/2014 0545   ALKPHOS 71 03/03/2014 0545   BILITOT 0.6 03/03/2014 0545   GFRNONAA 77* 03/03/2014 0545   GFRAA 89* 03/03/2014 0545    CBG (last 3)   Recent Labs  03/04/14 2155 03/05/14 0728 03/05/14 1213  GLUCAP 166* 187* 153*    INR RESULTS:   Lab Results  Component Value Date   INR 0.9 12/14/2007     Studies/Results: Nm Bone Scan Whole Body  03/03/2014   CLINICAL DATA:  History of left hip knee and leg pain. Without history of trauma ; chronic low back pain. History of bladder malignancy  EXAM: NUCLEAR MEDICINE WHOLE BODY BONE SCAN  TECHNIQUE: Whole body anterior and posterior images were obtained approximately 3 hours after intravenous injection of radiopharmaceutical.  RADIOPHARMACEUTICALS:  Twenty-five mCi Technetium-99 MDP  COMPARISON:  Report of  an outside nuclear bone scan dated April 10, 1999  FINDINGS: There is adequate uptake of the radiopharmaceutical by the skeleton. Adequate soft tissue clearance and renal activity is demonstrated. Uptake over the calvarium and pectoral girdle is normal. Activity over the thoracic spine is normal. There is stable reverse S-shaped scoliosis of the thoracolumbar spine. Uptake of the radiopharmaceutical within the ribs, sternum, and pelvis is normal. There is normal uptake in the hips. There  is mildly increased uptake in the left knee and minimal increased uptake in the right knee compatible with osteoarthritis.  IMPRESSION: 1. There is no abnormal uptake within the skull, spine, ribs, or pectoral girdle, or pelvis. 2. There is moderately increased uptake in the left knee with mildly increased uptake in the right knee most compatible with degenerative change. Plain films of the knees are recommended.   Electronically Signed   By: David  Martinique   On: 03/03/2014 16:47    Medications: I have reviewed the patient's current medications.  Assessment/Plan: #1 Left Lower Extremity pain and weakness: improved with high-dose IV corticosteroids and most likely from lumbar spine degenerative disc disease and osteoarthritis given findings from his lumbar spine MRI exam and whole-body on scan.  The bone scan was not suggestive of metastatic carcinoma. He has moderately improved however still quite unstable on his feet and at risk for fall, physical therapy consultation appreciated, after excessive discussion with patient and daughter, we'll set up for skilled nursing facility placement on Monday with outpatient steroid injection if feasible along with continued rehabilitation effort with an attempt to regain independence and ability to remain safe at home. Will taper steroids.  #2 DM2:  Blood sugars are slightly higher will adjust dose basal and sliding scale insulin as indicated. #3 HTN:  Well controlled on medication.  LOS: 3 days   Simcha Farrington R 03/05/2014, 12:47 PM

## 2014-03-05 NOTE — Clinical Social Work Placement (Addendum)
Clinical Social Work Department CLINICAL SOCIAL WORK PLACEMENT NOTE 03/05/2014  Patient:  Taylor [name], Taylor [name]  Account Number:  [id number] Admit date:  03/02/2014  Clinical Social Worker:  Carrington Clamp, LCSWA  Date/time:  03/05/2014 09:55 PM  Clinical Social Work is seeking post-discharge placement for this patient at the following level of care:   [place]   (*CSW will update this form in Epic as items are completed)   03/05/2014  Patient/family provided with [place] Department of Clinical Social Work's list of facilities offering this level of care within the geographic area requested by the patient (or if unable, by the patient's family).  03/05/2014  Patient/family informed of their freedom to choose among providers that offer the needed level of care, that participate in Medicare, Medicaid or managed care program needed by the patient, have an available bed and are willing to accept the patient.  03/05/2014  Patient/family informed of [facility]' ownership interest in [facility], as well as of the fact that they are under no obligation to receive care at this facility.  PASARR submitted to EDS on Existing PASARR number received on Existing  FL2 transmitted to all facilities in geographic area requested by pt/family on  03/05/2014 FL2 transmitted to all facilities within larger geographic area on   Patient informed that his/her managed care company has contracts with or will negotiate with  certain facilities, including the following:     Patient/family informed of bed offers received:  03/07/2014 Lubertha Sayres, Latanya Presser) [facility]) Physician recommends and patient chooses bed at    Patient to be transferred to  [facility] on  03/07/2014 Lubertha Sayres, [place]) Patient to be transferred to facility by [facility] Lubertha Sayres, [place]) Patient and family notified of transfer on 03/07/2014 Lubertha Sayres,  Latanya Presser) Name of family member notified:  Patient alert and oriented. [place])  The following physician request were entered in Epic:   Additional Comments:  Carrington Clamp, [facility] Weekend Clinical Social Worker [phone]  Lubertha Sayres, [place] ([phone]) Licensed Clinical Social Worker Orthopedics [phone]) and Surgical [phone])

## 2014-03-05 NOTE — Clinical Social Work Psychosocial (Signed)
Clinical Social Work Department BRIEF PSYCHOSOCIAL ASSESSMENT 03/05/2014  Patient:  Taylor [name], Taylor [name]     Account Number:  [id number]     Admit date:  03/02/2014  Clinical Social Worker:  Hubert Azure  Date/Time:  03/05/2014 09:57 PM  Referred by:  Physician  Date Referred:  03/05/2014 Referred for  SNF Placement   Other Referral:   Interview type:  Family Other interview type:   Patient's daughter Taylor [name] was present at bedside.    PSYCHOSOCIAL DATA Living Status:  WITH ADULT CHILDREN Admitted from facility:   Level of care:   Primary support name:  Taylor [name] [phone]) Primary support relationship to patient:  CHILD, ADULT Degree of support available:   Good    CURRENT CONCERNS Current Concerns  Post-Acute Placement   Other Concerns:    SOCIAL WORK ASSESSMENT / PLAN CSW met with patient's daughter Taylor [name] who was present at bedside. CSW introduced self and explained role. CSW explained SNF placement process and discussed d/c plan with daughter. Per Taylor [name], patient was independent in December and symptoms stated in January. Taylor [name] states patient started with cane in January and gradually moved into using rolling walker. Daughter is agreeable to SNF and lists preference for [facility]. CSW encouraged daughter to explore alternative SNF options in the event [facility] does not have availability. Daughter prefers [name] but is agreeable to [facility].   Assessment/plan status:  Other - See comment Other assessment/ plan:   CSW to submit PASARR and complete FL2 for placement.   Information/referral to community resources:    PATIENT'S/FAMILY'S RESPONSE TO PLAN OF CARE: Patient's daughter was cooperative and lists preference for [facility], but agreeable to bed offers from other facilities.   [place], [place] Weekend Clinical Social Worker [phone]

## 2014-03-06 LAB — GLUCOSE, CAPILLARY
GLUCOSE-CAPILLARY: 154 mg/dL — AB (ref 70–99)
GLUCOSE-CAPILLARY: 150 mg/dL — AB (ref 70–99)
GLUCOSE-CAPILLARY: 139 mg/dL — AB (ref 70–99)
Glucose-Capillary: 140 mg/dL — ABNORMAL HIGH (ref 70–99)

## 2014-03-06 NOTE — Clinical Social Work Note (Signed)
CSW met with patient and daughter and presented bed offers. Patient has accepted bed offer with [facility]. CSW contacted facility and made Deshanti Adcox aware. CSW to follow tomorrow.  [place], [place] Weekend Clinical Social Worker [phone]

## 2014-03-06 NOTE — Progress Notes (Addendum)
Subjective: Sitting in his chair no apparent distress answering all questions appropriately, appreciated input from physical therapy from previous day, does still feel weak and pain in his left lower extremity, at risk for falls, marginally improved since admission and yesterday. Able to stand on the side of the bed, but minimal ambulation eating assistance. He is very interested and a lumbar spine injection to see if this can help remedy the situation status post information from the MRI and bone scan. Daughter present, they are all in agreement, discussed that this would probably not be possible on the weekend, will pursue on Monday and possibly through skilled nursing facility as previously discussed extensively between physical therapy patient daughter as well as Dr. Sharlett Iles 2 days ago. Does not feel safe going home independently given that daughter works 40 hours a week.does agree that short-term nursing facility may be beneficial in order to facilitate spinal injection, rehabilitation process and transition back home with his daughter to live in a semi-independent fashion. Denies nausea vomiting anorexia focal neurologic deficits that are new, chest pain shortness of breath denies abdominal pain.  Objective: Vital signs in last 24 hours: Temp:  [97.9 F (36.6 C)-98.5 F (36.9 C)] 97.9 F (36.6 C) (01/31 0552) Pulse Rate:  [72] 72 (01/30 2149) Resp:  [17-18] 17 (01/31 0552) BP: (131-153)/(62-68) 136/62 mmHg (01/31 0552) SpO2:  [94 %-99 %] 94 % (01/31 0552) Weight change:    Intake/Output from previous day: 01/30 0701 - 01/31 0700 In: 8119 [P.O.:820; I.V.:470] Out: 1600 [Urine:1600]   Moderately obese sitting in a bit chair in no apparent distress, and person, answering all questions appropriately No oropharyngeal lesions  neck supple Clear to auscultation bilaterally Regular rate and rhythm Abdomen soft, nontender, nondistended No edema, pedal pulses intact no cyanosis Gait not  assessed given significant workup by physical therapy  Lab Results: No results for input(s): WBC, HGB, HCT, PLT in the last 72 hours. BMET No results for input(s): NA, K, CL, CO2, GLUCOSE, BUN, CREATININE, CALCIUM in the last 72 hours. CMET CMP     Component Value Date/Time   NA 137 03/03/2014 0545   K 5.0 03/03/2014 0545   CL 101 03/03/2014 0545   CO2 29 03/03/2014 0545   GLUCOSE 249* 03/03/2014 0545   BUN 16 03/03/2014 0545   CREATININE 0.89 03/03/2014 0545   CALCIUM 9.4 03/03/2014 0545   PROT 7.2 03/03/2014 0545   ALBUMIN 3.5 03/03/2014 0545   AST 16 03/03/2014 0545   ALT 13 03/03/2014 0545   ALKPHOS 71 03/03/2014 0545   BILITOT 0.6 03/03/2014 0545   GFRNONAA 77* 03/03/2014 0545   GFRAA 89* 03/03/2014 0545    CBG (last 3)   Recent Labs  03/05/14 1703 03/05/14 2148 03/06/14 0750  GLUCAP 187* 210* 154*    INR RESULTS:   Lab Results  Component Value Date   INR 0.9 12/14/2007     Studies/Results: No results found.  Medications: I have reviewed the patient's current medications.  Assessment/Plan: #1 Left Lower Extremity pain and weakness: improved with high-dose IV corticosteroids and most likely from lumbar spine degenerative disc disease and osteoarthritis given findings from his lumbar spine MRI exam and whole-body on scan.  The bone scan was not suggestive of metastatic carcinoma. He has moderately improved however still quite unstable on his feet and at risk for fall, physical therapy consultation appreciated, after significant discussion with patient and daughter, we'll set up for skilled nursing facility placement on Monday with outpatient steroid injection if  feasible along with continued rehabilitation effort with an attempt to regain independence and ability to remain safe at home. Will taper steroids. IV Solu-Medrol changed to prednisone this morning. #2 DM2:  Blood sugars are slightly higher will adjust dose basal and sliding scale insulin as indicated.  Given decrease in prednisone will hold the course for now. #3 HTN:  Well controlled on medication.  LOS: 4 days   Rito Lecomte R 03/06/2014, 8:22 AM  Bed search for skilled nursing facility pending social work notes reviewed

## 2014-03-07 LAB — GLUCOSE, CAPILLARY
Glucose-Capillary: 97 mg/dL (ref 70–99)
Glucose-Capillary: 135 mg/dL — ABNORMAL HIGH (ref 70–99)

## 2014-03-07 MED ORDER — PREDNISONE 20 MG PO TABS: 20.0000 mg | ORAL_TABLET | Freq: Every day | ORAL | Status: DC

## 2014-03-07 MED ORDER — POLYETHYLENE GLYCOL 3350 17 G PO PACK: 17.0000 g | PACK | Freq: Every day | ORAL | Status: DC | PRN

## 2014-03-07 MED ORDER — OXYCODONE-ACETAMINOPHEN 7.5-325 MG PO TABS: 1.0000 | ORAL_TABLET | ORAL | Status: DC | PRN

## 2014-03-07 NOTE — Discharge Instructions (Signed)
Oxygen is to be used at night AND during the day if needed Prednisone is to continue at 20 mg for a week then 10 mg for a week then stay on 5 mg thereafter Sugars are to be checked BID, A sliding scale may be needed Diet is Carbohydrate Modified

## 2014-03-07 NOTE — Care Management Note (Signed)
  Page 1 of 1   03/07/2014     11:13:33 AM CARE MANAGEMENT NOTE 03/07/2014  Patient:  Taylor [name], Taylor [name]   Account Number:  [id number]  Date Initiated:  03/03/2014  Documentation initiated by:  Sandi Mariscal  Subjective/Objective Assessment:   progressive LE weakness; PMH COPD     Action/Plan:   home and independent PTA; await imaging results and MD plan to know d/c needs   Anticipated DC Date:  03/07/2014   Anticipated DC Plan:  SKILLED NURSING FACILITY         Choice offered to / List presented to:             Status of service:  Completed, signed off Medicare Important Message given?  YES (If response is "NO", the following Medicare IM given date fields will be blank) Date Medicare IM given:  03/04/2014 Medicare IM given by:  Sandi Mariscal Date Additional Medicare IM given:  03/07/2014 Additional Medicare IM given by:  Magdalen Spatz  Discharge Disposition:  [place]  Per UR Regulation:  Reviewed for med. necessity/level of care/duration of stay  If discussed at [place] of Stay Meetings, dates discussed:    Comments:

## 2014-03-07 NOTE — Progress Notes (Signed)
Patient discharged to [facility] for rehab, report was given nurse Misty Cox.

## 2014-03-07 NOTE — Clinical Social Work Note (Signed)
Patient to be discharged to [facility]. Patient updated at bedside.  Facility: [place] Report number: [phone] Transportation: EMS ([address]  Lubertha Sayres, [place] ([phone]) Licensed Clinical Social Worker Orthopedics [phone]) and Surgical [phone])

## 2014-03-07 NOTE — Discharge Summary (Signed)
DISCHARGE SUMMARY  Taylor [name]  MR#: [id number]  DOB:[birth year]  Date of Admission: 03/02/2014 Date of Discharge: 03/07/2014  Attending Physician:Jerrianne Hartin ALAN  Patient's JKK:[id number] Antony Haste, MD  Consults:  none  Discharge Diagnoses: Principal Problem: Left leg weakness and gait disorder Active Problems:   Type 2 diabetes mellitus   Hypothyroidism   Hypertension   Hyperlipidemia   History of bladder cancer   OSA (obstructive sleep apnea)   Back pain COPD CAD, stable S1 lesion, not seen on nuclear medicine bone scan Depression PVD osteopenia  Discharge Medications:   Medication List    TAKE these medications        acetaminophen 500 MG tablet  Commonly known as:  TYLENOL  Take 1,000 mg by mouth every 6 (six) hours.     albuterol (2.5 MG/3ML) 0.083% nebulizer solution  Commonly known as:  PROVENTIL  Take 3 mLs (2.5 mg total) by nebulization every 6 (six) hours as needed for wheezing or shortness of breath.     ALPRAZolam 0.25 MG tablet  Commonly known as:  XANAX  Take 0.25 mg by mouth at bedtime.     aspirin 81 MG tablet  Take 81 mg by mouth daily.     atorvastatin 10 MG tablet  Commonly known as:  LIPITOR  Take 10 mg by mouth daily at 6 PM.     cholecalciferol 1000 UNITS tablet  Commonly known as:  VITAMIN D  Take 2,000 Units by mouth daily.     diltiazem 180 MG 24 hr capsule  Commonly known as:  CARDIZEM CD  Take 180 mg by mouth daily.     DULoxetine 30 MG capsule  Commonly known as:  CYMBALTA  Take 30 mg by mouth every evening.     fluticasone 50 MCG/ACT nasal spray  Commonly known as:  FLONASE  Place 2 sprays into the nose daily.     ketoconazole 2 % cream  Commonly known as:  NIZORAL  Apply 1 application topically daily as needed for irritation.     latanoprost 0.005 % ophthalmic solution  Commonly known as:  XALATAN  Place 1 drop into both eyes every evening.     levothyroxine 100 MCG tablet  Commonly known as:   SYNTHROID, LEVOTHROID  - Take 50-100 mcg by mouth daily before breakfast. Takes 62mcg on sun and mon  - Takes 122mcg all other days     metFORMIN 500 MG tablet  Commonly known as:  GLUCOPHAGE  Take 500 mg by mouth 2 (two) times daily with a meal.     omeprazole 20 MG capsule  Commonly known as:  PRILOSEC  Take 20 mg by mouth daily.     oxyCODONE-acetaminophen 7.5-325 MG per tablet  Commonly known as:  PERCOCET  Take 1 tablet by mouth every 4 (four) hours as needed for pain.     OXYGEN  Inhale 2.5 L into the lungs continuous.     polyethylene glycol packet  Commonly known as:  MIRALAX / GLYCOLAX  Take 17 g by mouth daily.     polyethylene glycol packet  Commonly known as:  MIRALAX / GLYCOLAX  Take 17 g by mouth daily as needed for mild constipation.     predniSONE 20 MG tablet  Commonly known as:  DELTASONE  Take 1 tablet (20 mg total) by mouth daily with breakfast. For a week then 10 mg daily for a week then leave on 5 mg thereafter     valsartan 80 MG tablet  Commonly known  as:  DIOVAN  Take 80 mg by mouth daily.        Hospital Procedures: Mr Lumbar Spine W Wo Contrast  03/02/2014   CLINICAL DATA:  Low back pain extending into the left lower extremity. Pain beginning 4 weeks ago. Unable to walk over the last 2 days.  EXAM: MRI LUMBAR SPINE WITHOUT AND WITH CONTRAST  TECHNIQUE: Multiplanar and multiecho pulse sequences of the lumbar spine were obtained without and with intravenous contrast.  CONTRAST:  36mL MULTIHANCE GADOBENATE DIMEGLUMINE 529 MG/ML IV SOLN  COMPARISON:  CT of the lumbar spine 11/08/2010. MRI of the lumbar spine 03/29/2010.  FINDINGS: Dextro convex scoliosis of the lumbar spine is centered at L3. Chronic endplate marrow changes are present on the left at L3-4 and to a lesser extent at L2-3. 11 mm peripherally enhancing lesion is new in the posterior aspect of the S1 vertebral body. No other focal marrow enhancement is evident.  Bilateral renal cysts are  similar to the prior exams. No other significant abdominal lesion is present. There is no significant adenopathy.  T12-L1:  Negative.  L1-2: A shallow central disc protrusion is present. There is no significant stenosis.  L2-3: A leftward disc protrusion is present. Asymmetric left-sided facet hypertrophy is noted. There is progressive left subarticular and foraminal stenosis. The right foramen remains patent.  L3-4: A wide laminectomy is noted. A leftward disc protrusion remains. Mild left foraminal stenosis is unchanged.  L4-5: A rightward disc protrusion is present. The patient is status post laminectomy. Mild subarticular stenosis is similar to the prior study, worse on the right. Mild foraminal narrowing is also worse on the right.  L5-S1: The patient is status post laminectomy. Progressive advanced facet hypertrophy is noted on the right. Enhancement in the epidural space on the right likely represents granulation tissue. There is no significant stenosis.  IMPRESSION: 1. New 10 mm enhancing lesion within the posterior body of S1. This raises concern for a focal metastasis. Whole-body bone scan may be used for further evaluation of the entire skeleton. 2. Interval laminectomy at L5 with cord progressive facet hypertrophy and expected epidural granulation tissue. 3. Progressive left subarticular and foraminal stenosis at L2-3 secondary to a progressive leftward disc protrusion. 4. Similar appearance of laminectomy and mild left foraminal stenosis at L3-4. 5. Similar appearance of laminectomy with mild subarticular and foraminal narrowing bilaterally at L4-5, worse on the right.   Electronically Signed   By: Lawrence Santiago M.D.   On: 03/02/2014 18:36   Nm Bone Scan Whole Body  03/03/2014   CLINICAL DATA:  History of left hip knee and leg pain. Without history of trauma ; chronic low back pain. History of bladder malignancy  EXAM: NUCLEAR MEDICINE WHOLE BODY BONE SCAN  TECHNIQUE: Whole body anterior and  posterior images were obtained approximately 3 hours after intravenous injection of radiopharmaceutical.  RADIOPHARMACEUTICALS:  Twenty-five mCi Technetium-99 MDP  COMPARISON:  Report of an outside nuclear bone scan dated April 10, 1999  FINDINGS: There is adequate uptake of the radiopharmaceutical by the skeleton. Adequate soft tissue clearance and renal activity is demonstrated. Uptake over the calvarium and pectoral girdle is normal. Activity over the thoracic spine is normal. There is stable reverse S-shaped scoliosis of the thoracolumbar spine. Uptake of the radiopharmaceutical within the ribs, sternum, and pelvis is normal. There is normal uptake in the hips. There is mildly increased uptake in the left knee and minimal increased uptake in the right knee compatible with osteoarthritis.  IMPRESSION: 1.  There is no abnormal uptake within the skull, spine, ribs, or pectoral girdle, or pelvis. 2. There is moderately increased uptake in the left knee with mildly increased uptake in the right knee most compatible with degenerative change. Plain films of the knees are recommended.   Electronically Signed   By: David  Martinique   On: 03/03/2014 16:47   Dg Hip Unilat With Pelvis Min 4 Views Left  03/03/2014   CLINICAL DATA:  Left hip pain for a few days.  No injury.  EXAM: DG HIP W/ PWLVIS 4+V*L*  COMPARISON:  None.  FINDINGS: Examination demonstrates diffuse decreased bone mineralization. There are mild symmetric degenerative changes of the hips. No acute fracture or dislocation. Moderate degenerative changes of the spine. Calcified plaque over the iliac and femoral arteries.  IMPRESSION: No acute findings.  Mild symmetric degenerative changes of the hips. Diffuse decreased bone mineralization.   Electronically Signed   By: Marin Olp M.D.   On: 03/03/2014 09:15    History of Present Illness: Back pain and leg weakness, Abnl MRI  Hospital Course: He was admitted with left leg weakness with difficulty walking  and pain. Initial MRI suggested possible metastatic dz but nuclear medicine bone scan did not confirm this. He has been treated with steroids and narcotics with a minor improvement. He has been seen once by PT. He reports his pain this AM is about the same as at admission but he is not very uncomfortable. He has been up out of bed the the bathroom without help but his leg is still somewhat weak.  He is breathing at his baseline. O2 has not been given overnight for the past 2 nights (despite orders).  His bowels are working well. He has no CP or SOB. His appetite is good. He has had no infectious sxs. He has no urinary sxs other than a bit of hesitancy. He has no pain in his back, only in the left leg. The leg is not overly numb I do not seen consult notes from either ortho or neurosurg and pt confirms no consults  PROBLEM LIST 1. LEG WEAKNESS AND GAIT DISORDER : MRI does not show a "surgical target" for intervention.  I wonder if a large nerve mononeuropathy of the femoral nerve (from the DM) might be present. Will need outpt nerve conduction studies to evaluate for this. Has an appt with NSurg already scheduled and should keep this appt. Pain control with morphine has been used here, will try Oxycodone/APAP for now but injectables might need to be added back MRI finding of " Progressive left subarticular and foraminal stenosis at L2-3 secondary to a progressive leftward disc protrusion." to be addressed by Neurosurgery 2. DM 2: was on low dose basal and sliding scale, here. Would resume METFORMIN and consider a sliding scale addition if needed. a1C was up at 7.8% 3. HYPOTHYROID: continue rx. No TSH checked while here 4.HYPERTENSION: BP reasonable. K on the high end of normal, Cr OK 5. HYPERLIPIDEMIA: on statin 6. BLADDER CA: bone scan negative 7. SLEEP APNEA: noctural O2 8. COPD: O2 at night and PRN. Stable this admission 9. CAD: stable. On ASA  ARB and CCB 10 S1 LESION: could be an isolated bone cyst  or a solitary plasmacytoma focus. Will need to decide if a bx is needed. NSurg can help with this decision. Not the proximate cause of current issues 11. DEPRESSION: doing OK on Rx 12. PVD: no pain at rest. Issue does not sound like claudication 13.  OSTEOPENIA  Will need a vit D check  Day of Discharge Exam BP 141/58 mmHg  Pulse 55  Temp(Src) 98.4 F (36.9 C) (Oral)  Resp 17  Ht 5\' 7"  (1.702 m)  Wt 108.001 kg (238 lb 1.6 oz)  BMI 37.28 kg/m2  SpO2 95%  Physical Exam: General appearance: sitting up in no distress. Well healed extensive facial skin grafts are intact Eyes: no scleral icterus, no nystagmus, conjugate gaze Throat: oropharynx moist without erythema Resp: clear but distant, no wheeze, no accessory ms in use Cardio: regular with murmur GI: obese soft, non-tender; bowel sounds normal; no masses,  no organomegaly Extremities: pulses are a bit diminished bilaterally, no edema Awake, clear fluent speech, bilateral strong grip. Left hip flexor is about 3/5, dorsilflexion on left is about 4+/5. He can lift the left leg about 8 inches above the bed for a few seconds but it is shaky. Sensation if reduced, it is difficult to get a patellar reflex  Discharge Labs:  Results for CARLIE, CORPUS (MRN [id number]) as of 03/07/2014 08:08  Ref. Range 03/02/2014 14:11 03/02/2014 14:21 03/03/2014 05:45  Sodium Latest Range: 135-145 mmol/L  139 137  Potassium Latest Range: 3.5-5.1 mmol/L  5.2 (H) 5.0  Chloride Latest Range: 96-112 mmol/L  98 101  CO2 Latest Range: 19-32 mmol/L   29  BUN Latest Range: 6-23 mg/dL  22 16  Creatinine Latest Range: 0.50-1.35 mg/dL  0.90 0.89  Calcium Latest Range: 8.4-10.5 mg/dL   9.4  GFR calc non Af Amer Latest Range: >90 mL/min   77 (L)  GFR calc Af Amer Latest Range: >90 mL/min   89 (L)  Glucose Latest Range: 70-99 mg/dL  122 (H) 249 (H)  Anion gap Latest Range: 5-15    7  Calcium Ionized Latest Range: 1.13-1.30 mmol/L  1.25   Alkaline Phosphatase Latest  Range: 39-117 U/L   71  Albumin Latest Range: 3.5-5.2 g/dL   3.5  AST Latest Range: 0-37 U/L   16  ALT Latest Range: 0-53 U/L   13  Total Protein Latest Range: 6.0-8.3 g/dL   7.2  Total Bilirubin Latest Range: 0.3-1.2 mg/dL   0.6  WBC Latest Range: 4.0-10.5 K/uL 12.4 (H)  11.1 (H)  RBC Latest Range: 4.22-5.81 MIL/uL 4.09 (L)  4.10 (L)  Hemoglobin Latest Range: 13.0-17.0 g/dL 12.8 (L) 14.6 12.7 (L)  HCT Latest Range: 39.0-52.0 % 40.0 43.0 40.1  MCV Latest Range: 78.0-100.0 fL 97.8  97.8  MCH Latest Range: 26.0-34.0 pg 31.3  31.0  MCHC Latest Range: 30.0-36.0 g/dL 32.0  31.7  RDW Latest Range: 11.5-15.5 % 13.4  13.4  Platelets Latest Range: 150-400 K/uL 213  206         Discharge instructions:   Oxygen is to be used at night AND during the day if needed Prednisone is to continue at 20 mg for a week then 10 mg for a week then stay on 5 mg thereafter Sugars are to be checked BID, A sliding scale may be needed Diet is Carbohydrate Modified  Disposition: to SNF  Follow-up Appts: a  Call for appointment.with Dr Forde Dandy after SNF discharge To See NSurgy  Condition on Discharge: stable  Tests Needing Follow-up: none  Signed: [place] 03/07/2014, 7:58 AM

## 2014-03-08 ENCOUNTER — Non-Acute Institutional Stay (SKILLED_NURSING_FACILITY): Payer: Medicare Other | Admitting: Registered Nurse

## 2014-03-08 ENCOUNTER — Encounter: Payer: Self-pay | Admitting: Registered Nurse

## 2014-03-08 DIAGNOSIS — M79602 Pain in left arm: Secondary | ICD-10-CM

## 2014-03-08 DIAGNOSIS — R531 Weakness: Secondary | ICD-10-CM

## 2014-03-08 DIAGNOSIS — K5900 Constipation, unspecified: Secondary | ICD-10-CM

## 2014-03-08 DIAGNOSIS — E785 Hyperlipidemia, unspecified: Secondary | ICD-10-CM

## 2014-03-08 DIAGNOSIS — E1143 Type 2 diabetes mellitus with diabetic autonomic (poly)neuropathy: Secondary | ICD-10-CM

## 2014-03-08 DIAGNOSIS — I4891 Unspecified atrial fibrillation: Secondary | ICD-10-CM

## 2014-03-08 DIAGNOSIS — H409 Unspecified glaucoma: Secondary | ICD-10-CM

## 2014-03-08 DIAGNOSIS — G8929 Other chronic pain: Secondary | ICD-10-CM

## 2014-03-08 DIAGNOSIS — F418 Other specified anxiety disorders: Secondary | ICD-10-CM

## 2014-03-08 DIAGNOSIS — J449 Chronic obstructive pulmonary disease, unspecified: Secondary | ICD-10-CM

## 2014-03-08 DIAGNOSIS — G4733 Obstructive sleep apnea (adult) (pediatric): Secondary | ICD-10-CM

## 2014-03-08 DIAGNOSIS — E039 Hypothyroidism, unspecified: Secondary | ICD-10-CM

## 2014-03-08 DIAGNOSIS — I10 Essential (primary) hypertension: Secondary | ICD-10-CM

## 2014-03-08 DIAGNOSIS — M79605 Pain in left leg: Secondary | ICD-10-CM

## 2014-03-08 NOTE — Progress Notes (Signed)
Patient ID: Taylor [name], male   DOB: 01/23/1929, 81 y.o.   MRN: [id number]   Place of Service: [facility]  Allergies  Allergen Reactions  . Amoxicillin Palpitations  . Motrin [Ibuprofen] Palpitations  . Chantix [Varenicline Tartrate] Other (See Comments)    depression    Code Status: DNR  Goals of Care: Comfort and Quality of Life/STR  Chief Complaint  Patient presents with  . Hospitalization Follow-up    HPI 81 y.o. male with PMH of HTN, HLD, OSA, hypothyroidism, atrial fibrillation, glaucoma, anxiety, bladder cancer among many others is being seen for a follow-up visit post hospital admission from 03/02/14 to 03/07/14 for left leg pain and weakness. MRI did not show a "surgical target" for intervention. Questionable femoral nerve mononeuropathy and outpatient nerve conduction studies for further evaluation was suggested. He is here for short term rehabilitation and his goal is to return home. Seen in room today. Reported having tingling and numbness in toes, especially of left toes. Still having LLE pain, especially with walking. Reported having constipation-last BM 2 days ago. No other complaints reported.   Review of Systems Constitutional: Negative for fever, chills, and fatigue. HENT: Negative for ear pain, congestion, and sore throat Eyes: Negative for eye pain, eye discharge, and visual disturbance  Cardiovascular: Negative for chest pain, palpitations, and leg swelling Respiratory: Negative cough, shortness of breath, and wheezing.  Gastrointestinal: Negative for nausea and vomiting. Negative for abdominal pain and diarrhea. Positive for constipation. Genitourinary: Negative for  Dysuria and hematuria Endocrine: Negative for polydipsia, polyphagia, and polyuria Musculoskeletal: Negative for back pain, joint pain, and joint swelling. Positive for LLE pain Neurological: Negative for dizziness, headache.  Skin: Negative for rash and wound.   Psychiatric: Negative  for depression  Past Medical History  Diagnosis Date  . Hypertension   . Hyperlipidemia   . Hypothyroidism   . COPD (chronic obstructive pulmonary disease)   . Atrial fibrillation   . Glaucoma   . Anxiety   . History of bladder cancer   . Lung nodule     SMALL  . OSA (obstructive sleep apnea)     Past Surgical History  Procedure Laterality Date  . Transurethral resection of prostate    . Cardiovascular stress test  12/25/2007    EF 64%. NO EVIDENCE OF ISCHEMIA  . Back surgery  10-18-11  . Cystoscopy N/A 12/15    History  Substance Use Topics  . Smoking status: Former Smoker -- 1.50 packs/day for 60 years    Types: Cigarettes    Quit date: 11/27/2010  . Smokeless tobacco: Never Used  . Alcohol Use: No    Family History  Problem Relation Age of Onset  . Lung cancer      was never a smoker      Medication List       This list is accurate as of: 03/08/14  4:16 PM.  Always use your most recent med list.               acetaminophen 500 MG tablet  Commonly known as:  TYLENOL  Take 1,000 mg by mouth every 6 (six) hours.     albuterol (2.5 MG/3ML) 0.083% nebulizer solution  Commonly known as:  PROVENTIL  Take 3 mLs (2.5 mg total) by nebulization every 6 (six) hours as needed for wheezing or shortness of breath.     ALPRAZolam 0.25 MG tablet  Commonly known as:  XANAX  Take 0.25 mg by mouth at bedtime.  aspirin 81 MG tablet  Take 81 mg by mouth daily.     atorvastatin 10 MG tablet  Commonly known as:  LIPITOR  Take 10 mg by mouth daily at 6 PM.     cholecalciferol 1000 UNITS tablet  Commonly known as:  VITAMIN D  Take 2,000 Units by mouth daily.     diltiazem 180 MG 24 hr capsule  Commonly known as:  CARDIZEM CD  Take 180 mg by mouth daily.     DULoxetine 30 MG capsule  Commonly known as:  CYMBALTA  Take 30 mg by mouth every evening.     fluticasone 50 MCG/ACT nasal spray  Commonly known as:  FLONASE  Place 2 sprays into the nose daily.      gabapentin 100 MG capsule  Commonly known as:  NEURONTIN  Take 100 mg by mouth 3 (three) times daily.     latanoprost 0.005 % ophthalmic solution  Commonly known as:  XALATAN  Place 1 drop into both eyes every evening.     levothyroxine 100 MCG tablet  Commonly known as:  SYNTHROID, LEVOTHROID  - Take 50-100 mcg by mouth daily before breakfast. Takes 61mcg on sun and mon  - Takes 149mcg all other days     losartan 50 MG tablet  Commonly known as:  COZAAR  Take 50 mg by mouth daily.     metFORMIN 500 MG tablet  Commonly known as:  GLUCOPHAGE  Take 500 mg by mouth 2 (two) times daily with a meal.     omeprazole 20 MG capsule  Commonly known as:  PRILOSEC  Take 20 mg by mouth daily.     oxyCODONE-acetaminophen 7.5-325 MG per tablet  Commonly known as:  PERCOCET  Take 1 tablet by mouth every 4 (four) hours as needed for pain.     OXYGEN  Inhale 2.5 L into the lungs continuous.     polyethylene glycol packet  Commonly known as:  MIRALAX / GLYCOLAX  Take 17 g by mouth daily.     predniSONE 20 MG tablet  Commonly known as:  DELTASONE  Take 1 tablet (20 mg total) by mouth daily with breakfast.        Physical Exam  BP 100/69 mmHg  Pulse 64  Temp(Src) 97.5 F (36.4 C)  Resp 20  Ht 5\' 7"  (1.702 m)  Wt 224 lb (101.606 kg)  BMI 35.08 kg/m2  SpO2 97%  Constitutional: Obese elderly male in no acute distress. Conversant and pleasant HEENT: Normocephalic and atraumatic. PERRL. EOM intact. No icterus. Oral mucosa moist. Posterior pharynx clear of any exudate or lesions. Teeth and gingiva in good general condition.  Neck: Supple and nontender. No lymphadenopathy, masses, or thyromegaly. No JVD or carotid bruits. Cardiac: Normal S1, S2. RRR without appreciable murmurs, rubs, or gallops. Distal pulses intact. Trace dependent edema.  Lungs: No respiratory distress. Breath sounds clear bilaterally without rales, rhonchi, or wheezes. Abdomen: Audible bowel sounds in all  quadrants. Soft, nontender, nondistended. No palpable mass.  Musculoskeletal: able to move all extremities. Generalized weakness present. No joint erythema or tenderness.  Skin: Warm and dry. No rash noted. No erythema.  Neurological: Alert and oriented to person, place, and time. No focal deficits.  Psychiatric: Judgment and insight adequate. Appropriate mood and affect.   Labs Reviewed  CBC Latest Ref Rng 03/03/2014 03/02/2014 03/02/2014  WBC 4.0 - 10.5 K/uL 11.1(H) - 12.4(H)  Hemoglobin 13.0 - 17.0 g/dL 12.7(L) 14.6 12.8(L)  Hematocrit 39.0 - 52.0 % 40.1  43.0 40.0  Platelets 150 - 400 K/uL 206 - 213    CMP Latest Ref Rng 03/03/2014 03/02/2014 12/01/2010  Glucose 70 - 99 mg/dL 249(H) 122(H) 213(H)  BUN 6 - 23 mg/dL 16 22 16   Creatinine 0.50 - 1.35 mg/dL 0.89 0.90 0.62  Sodium 135 - 145 mmol/L 137 139 139  Potassium 3.5 - 5.1 mmol/L 5.0 5.2(H) 3.5  Chloride 96 - 112 mmol/L 101 98 100  CO2 19 - 32 mmol/L 29 - 30  Calcium 8.4 - 10.5 mg/dL 9.4 - 8.7  Total Protein 6.0 - 8.3 g/dL 7.2 - 6.2  Total Bilirubin 0.3 - 1.2 mg/dL 0.6 - 0.4  Alkaline Phos 39 - 117 U/L 71 - 72  AST 0 - 37 U/L 16 - 20  ALT 0 - 53 U/L 13 - 19    Lab Results  Component Value Date   HGBA1C 7.8* 03/04/2014    Lab Results  Component Value Date   TSH 0.282* 11/27/2010    Diagnostic Studies Reviewed 03/02/14: MR lumbar spine 1. New 10 mm enhancing lesion within the posterior body of S1. This raises concern for a focal metastasis. Whole-body bone scan may be used for further evaluation of the entire skeleton. 2. Interval laminectomy at L5 with cord progressive facet hypertrophy and expected epidural granulation tissue. 3. Progressive left subarticular and foraminal stenosis at L2-3 secondary to a progressive leftward disc protrusion. 4. Similar appearance of laminectomy and mild left foraminal stenosis at L3-4. 5. Similar appearance of laminectomy with mild subarticular and foraminal narrowing bilaterally at L4-5,  worse on the right.  03/03/14: Nuclear Med whole body bone scan 1. There is no abnormal uptake within the skull, spine, ribs, or pectoral girdle, or pelvis. 2. There is moderately increased uptake in the left knee with mildly increased uptake in the right knee most compatible with degenerative change. Plain films of the knees are recommended.  03/03/14: DG Left Hip: No acute findings. Mild symmetric degenerative changes of the hips. Diffuse decreased ]bone mineralization.  Assessment & Plan 1. Essential hypertension BP soft today. Continue diltiazem 180mg  daily with losartan 50mg  daily for now. Monitor BP and HR every shift x 3 days then daily. Will adjust his BP meds as necessary. Continue to monitor his status.   2. OSA (obstructive sleep apnea) Per patient. Not on cpap at home and only use oxygen therapy at night.   3. Hypothyroidism, unspecified hypothyroidism type Continue levothyroxine 24mcg on Sunday and Monday and 160mcg daily on all other days. Continue to monitor  4. Hyperlipidemia Continue lipitor 10mg  daily.   5. Constipation, unspecified constipation type Continue miralax 17g daily. Will add senna s 8.6/50mg  2 tabs daily and dulcolax 10mg  supp daily prn for moderate to severe constipation. Encourage hydration and ambulation as tolerated. Continue to monitor.   6. Glaucoma Continue xalatan 0.005% 1 gtt to both eyes daily. Continue to monitor  7. Type 2 diabetes mellitus with autonomic neuropathy Recent a1c 7.8. Continue metformin 500mg  twice daily. Monitor cbg daily. Will start gabapentin 100mg  three times daily for neuropathic pain. Reassess and continue to monitor.   8. Ongoing leg pain, left Questionable femoral nerve mononeuropathy? Pending neuro surg consult. Continue prednisone 20mg  daily x 1 week, 10mg  daily x 1 week, then 5mg  daily. Pain is adequately controlled at this time. Will change tylenol 1000mg  to every six hours as needed and continue percocet 7.5/325mg  every  four hours as needed for pain. Patient aware not to exceed >4g of tylenol in 24hr.  9. Generalized weakness Continue to work with PT/OT for gait/strength/balance training to restore/maximize function. Continue to monitor his status. Fall risk precautions.   10. COPD, severity to be determined No respiratory distress. Continue albuterol via neb every six hours as needed for shob and wheezing. Continue oxygen via [place] at 2.5lmp at night.    11. Atrial fibrillation, unspecified Stable. RRR on exam. Continue diltiazem 180mg  daily and monitor.   12. Depression with anxiety Continue cymbalta 30mg  daily with xanax 0.25mg  daily at bedtime. Continue to monitor for change in mod.   Diagnostic Studies/Labs Ordered: cbc, bmp  Time spent: 45 minutes on care coordination and counseling.   Family/Staff Communication Plan of care discussed with patient and nursing staff. Patient and nursing staff verbalized understanding and agree with plan of care. No additional questions or concerns reported.    Arthur Holms, MSN, AGNP-C [facility] [address] [place], [place] [address] [phone] [8am-5pm] After hours: [phone]

## 2014-03-09 ENCOUNTER — Non-Acute Institutional Stay (SKILLED_NURSING_FACILITY): Payer: Medicare Other | Admitting: Internal Medicine

## 2014-03-09 DIAGNOSIS — K5900 Constipation, unspecified: Secondary | ICD-10-CM

## 2014-03-09 DIAGNOSIS — E1143 Type 2 diabetes mellitus with diabetic autonomic (poly)neuropathy: Secondary | ICD-10-CM

## 2014-03-09 DIAGNOSIS — G4733 Obstructive sleep apnea (adult) (pediatric): Secondary | ICD-10-CM

## 2014-03-09 DIAGNOSIS — E039 Hypothyroidism, unspecified: Secondary | ICD-10-CM

## 2014-03-09 DIAGNOSIS — F329 Major depressive disorder, single episode, unspecified: Secondary | ICD-10-CM

## 2014-03-09 DIAGNOSIS — R29898 Other symptoms and signs involving the musculoskeletal system: Secondary | ICD-10-CM

## 2014-03-09 DIAGNOSIS — I10 Essential (primary) hypertension: Secondary | ICD-10-CM

## 2014-03-09 DIAGNOSIS — F32A Depression, unspecified: Secondary | ICD-10-CM

## 2014-03-09 DIAGNOSIS — I4892 Unspecified atrial flutter: Secondary | ICD-10-CM

## 2014-03-09 LAB — BASIC METABOLIC PANEL
BUN: 24 mg/dL — AB (ref 4–21)
CREATININE: 0.8 mg/dL (ref 0.6–1.3)
Glucose: 93 mg/dL
Potassium: 4.1 mmol/L (ref 3.4–5.3)
Sodium: 140 mmol/L (ref 137–147)

## 2014-03-09 LAB — CBC AND DIFFERENTIAL
HEMATOCRIT: 41 % (ref 41–53)
HEMOGLOBIN: 14.1 g/dL (ref 13.5–17.5)
Platelets: 164 10*3/uL (ref 150–399)
WBC: 14.4 10^3/mL

## 2014-03-10 ENCOUNTER — Non-Acute Institutional Stay (SKILLED_NURSING_FACILITY): Payer: Medicare Other | Admitting: Registered Nurse

## 2014-03-10 ENCOUNTER — Encounter: Payer: Self-pay | Admitting: Internal Medicine

## 2014-03-10 DIAGNOSIS — L03116 Cellulitis of left lower limb: Secondary | ICD-10-CM

## 2014-03-10 NOTE — Progress Notes (Signed)
Patient ID: Taylor [name], male   DOB: 1928/07/15, 81 y.o.   MRN: [id number]   Place of Service: [facility]  Allergies  Allergen Reactions  . Amoxicillin Palpitations  . Motrin [Ibuprofen] Palpitations  . Chantix [Varenicline Tartrate] Other (See Comments)    depression    Code Status: DNR  Goals of Care: Comfort and Quality of Life/STR  Chief Complaint  Patient presents with  . Acute Visit    LLE swelling and pain    HPI 81 y.o. male with PMH of HTN, HLD, OSA, hypothyroidism, atrial fibrillation, glaucoma, anxiety, bladder cancer among many others is being seen for an acute visit at the request of nursing staff for the evaluation of left leg swelling with pain and erythema x 1 day. Seen in room today. Patient reported have more pain in his LLE along with some redness. He denies any fever, chills, headache, dizziness, chest pain, shortness of breath, abdominal pain, and n/v. ROS otherwise unremarkable.    Past Medical History  Diagnosis Date  . Hypertension   . Hyperlipidemia   . Hypothyroidism   . COPD (chronic obstructive pulmonary disease)   . Atrial fibrillation   . Glaucoma   . Anxiety   . History of bladder cancer   . Lung nodule     SMALL  . OSA (obstructive sleep apnea)     Past Surgical History  Procedure Laterality Date  . Transurethral resection of prostate    . Cardiovascular stress test  12/25/2007    EF 64%. NO EVIDENCE OF ISCHEMIA  . Back surgery  10-18-11  . Cystoscopy N/A 12/15    History  Substance Use Topics  . Smoking status: Former Smoker -- 1.50 packs/day for 60 years    Types: Cigarettes    Quit date: 11/27/2010  . Smokeless tobacco: Never Used  . Alcohol Use: No    Family History  Problem Relation Age of Onset  . Lung cancer      was never a smoker      Medication List       This list is accurate as of: 03/10/14  9:48 PM.  Always use your most recent med list.               acetaminophen 500 MG tablet    Commonly known as:  TYLENOL  Take 1,000 mg by mouth every 6 (six) hours as needed.     albuterol (2.5 MG/3ML) 0.083% nebulizer solution  Commonly known as:  PROVENTIL  Take 3 mLs (2.5 mg total) by nebulization every 6 (six) hours as needed for wheezing or shortness of breath.     ALPRAZolam 0.25 MG tablet  Commonly known as:  XANAX  Take 0.25 mg by mouth at bedtime.     aspirin 81 MG tablet  Take 81 mg by mouth daily.     atorvastatin 10 MG tablet  Commonly known as:  LIPITOR  Take 10 mg by mouth daily at 6 PM.     bisacodyl 10 MG suppository  Commonly known as:  DULCOLAX  Place 10 mg rectally daily as needed for moderate constipation or severe constipation.     cholecalciferol 1000 UNITS tablet  Commonly known as:  VITAMIN D  Take 2,000 Units by mouth daily.     diltiazem 180 MG 24 hr capsule  Commonly known as:  CARDIZEM CD  Take 180 mg by mouth daily.     DULoxetine 30 MG capsule  Commonly known as:  CYMBALTA  Take 30 mg by mouth every evening.     fluticasone 50 MCG/ACT nasal spray  Commonly known as:  FLONASE  Place 2 sprays into the nose daily.     gabapentin 100 MG capsule  Commonly known as:  NEURONTIN  Take 100 mg by mouth 3 (three) times daily.     latanoprost 0.005 % ophthalmic solution  Commonly known as:  XALATAN  Place 1 drop into both eyes every evening.     levothyroxine 100 MCG tablet  Commonly known as:  SYNTHROID, LEVOTHROID  - Take 50-100 mcg by mouth daily before breakfast. Takes 61mcg on sun and mon  - Takes 130mcg all other days     losartan 50 MG tablet  Commonly known as:  COZAAR  Take 50 mg by mouth daily.     metFORMIN 500 MG tablet  Commonly known as:  GLUCOPHAGE  Take 500 mg by mouth 2 (two) times daily with a meal.     omeprazole 20 MG capsule  Commonly known as:  PRILOSEC  Take 20 mg by mouth daily.     oxyCODONE-acetaminophen 7.5-325 MG per tablet  Commonly known as:  PERCOCET  Take 1 tablet by mouth every 4 (four)  hours as needed for pain.     OXYGEN  Inhale 2.5 L into the lungs continuous.     polyethylene glycol packet  Commonly known as:  MIRALAX / GLYCOLAX  Take 17 g by mouth daily.     predniSONE 20 MG tablet  Commonly known as:  DELTASONE  Take 1 tablet (20 mg total) by mouth daily with breakfast.     sennosides-docusate sodium 8.6-50 MG tablet  Commonly known as:  SENOKOT-S  Take 2 tablets by mouth daily.        Physical Exam  BP 117/69 mmHg  Pulse 61  Temp(Src) 97.7 F (36.5 C)  Resp 19  Constitutional: Obese elderly male in no acute distress. Conversant and pleasant HEENT: PERRL. EOM intact. No icterus. Oral mucosa moist.  Neck: Supple and nontender. No lymphadenopathy, masses, or thyromegaly. No JVD or carotid bruits. Cardiac: Normal S1, S2. RRR without appreciable murmurs, rubs, or gallops. Distal pulses intact. Trace dependent edema.  Lungs: No respiratory distress. Breath sounds clear bilaterally without rales, rhonchi, or wheezes. Abdomen: Audible bowel sounds in all quadrants. Soft, nontender, nondistended. No palpable mass.  Musculoskeletal: able to move all extremities. Generalized weakness present.  Skin: Left lower leg swelling noted with large area of erythema. Warmer to touch in comparison to right side. Tender.  Neurological: Alert and oriented to person, place, and time. No focal deficits.  Psychiatric: Judgment and insight adequate. Appropriate mood and affect.   Labs Reviewed  CBC Latest Ref Rng 03/03/2014 03/02/2014 03/02/2014  WBC 4.0 - 10.5 K/uL 11.1(H) - 12.4(H)  Hemoglobin 13.0 - 17.0 g/dL 12.7(L) 14.6 12.8(L)  Hematocrit 39.0 - 52.0 % 40.1 43.0 40.0  Platelets 150 - 400 K/uL 206 - 213    CMP Latest Ref Rng 03/03/2014 03/02/2014 12/01/2010  Glucose 70 - 99 mg/dL 249(H) 122(H) 213(H)  BUN 6 - 23 mg/dL 16 22 16   Creatinine 0.50 - 1.35 mg/dL 0.89 0.90 0.62  Sodium 135 - 145 mmol/L 137 139 139  Potassium 3.5 - 5.1 mmol/L 5.0 5.2(H) 3.5  Chloride 96 -  112 mmol/L 101 98 100  CO2 19 - 32 mmol/L 29 - 30  Calcium 8.4 - 10.5 mg/dL 9.4 - 8.7  Total Protein 6.0 - 8.3 g/dL 7.2 - 6.2  Total  Bilirubin 0.3 - 1.2 mg/dL 0.6 - 0.4  Alkaline Phos 39 - 117 U/L 71 - 72  AST 0 - 37 U/L 16 - 20  ALT 0 - 53 U/L 13 - 19    Lab Results  Component Value Date   HGBA1C 7.8* 03/04/2014    Lab Results  Component Value Date   TSH 0.282* 11/27/2010   Assessment & Plan 1. Cellulitis of leg, left Keflex 500mg  four times daily x 7 days with florastor 250mg  twice daily x 10 days. Patient denies any drug allergy to abx but has documented allergy to amoxicillin. Will monitor closely for s&s of allergic rxns. Instruct patient to notify nursing staff immediately if he develops a rash, difficulty breathing, or palpitations.    Family/Staff Communication Plan of care discussed with patient and nursing staff. Patient and nursing staff verbalized understanding and agree with plan of care. No additional questions or concerns reported.    Arthur Holms, MSN, AGNP-C [facility] [address] [place], [place] [address] [phone] [8am-5pm] After hours: [phone]

## 2014-03-10 NOTE — Progress Notes (Signed)
Patient ID: Taylor [name], male   DOB: March 11, 1928, 81 y.o.   MRN: [id number]     Facility: [facility]    PCP: [name], MD  Code Status: DNR  Allergies  Allergen Reactions  . Amoxicillin Palpitations  . Motrin [Ibuprofen] Palpitations  . Chantix [Varenicline Tartrate] Other (See Comments)    depression    Chief Complaint  Patient presents with  . New Admit To SNF     HPI:  81 year old patient is here for short term rehabilitation post hospital admission from 03/02/14 to 03/07/14 for left leg pain and weakness. MRI showed progressive left subarticular and foraminal stenosis at L2-3 secondary to a progressive leftward disc protrusion and similar appearance of laminectomy and mild foraminal stenosis at L3-4 and L4-5. There was no need for surgical intervention felt in the hospital. There were concerns for femoral nerve mononeuropathy and outpatient follow up was recommended. Due to his leg weakness, rehabilitation was recommended. He is seen in his room today. He feels the strength in his left leg has come back some.but still has weakness. He is able to move around with assistance and a walker. He continues to have some numbness/ tingling in his ;eft toes and has pain in LLE. Mentions that current medication is helpful. He had a bowel movement this am. He mentions taking dulcolax daily at home in the morning and would like that started here. He has PMH of HTN, HLD, OSA, hypothyroidism, atrial fibrillation, glaucoma, anxiety, bladder cancer.  Review of Systems:  Constitutional: Negative for fever, chills, diaphoresis.  HENT: Negative for headache, congestion, nasal discharge Eyes: Negative for eye pain, blurred vision, double vision and discharge.  Respiratory: Negative for cough, shortness of breath and wheezing.   Cardiovascular: Negative for chest pain, palpitations. has leg swelling.  Gastrointestinal: Negative for heartburn, nausea, vomiting,  abdominal pain Genitourinary: Negative for dysuria Musculoskeletal: Negative for falls, joint pain and myalgias. assistive device used Skin: Negative for itching, rash.  Neurological: Negative for dizziness Psychiatric/Behavioral: Negative for depression, anxiety, insomnia and memory loss.    Past Medical History  Diagnosis Date  . Hypertension   . Hyperlipidemia   . Hypothyroidism   . COPD (chronic obstructive pulmonary disease)   . Atrial fibrillation   . Glaucoma   . Anxiety   . History of bladder cancer   . Lung nodule     SMALL  . OSA (obstructive sleep apnea)    Past Surgical History  Procedure Laterality Date  . Transurethral resection of prostate    . Cardiovascular stress test  12/25/2007    EF 64%. NO EVIDENCE OF ISCHEMIA  . Back surgery  10-18-11  . Cystoscopy N/A 12/15   Social History:   reports that he quit smoking about 3 years ago. His smoking use included Cigarettes. He has a 90 pack-year smoking history. He has never used smokeless tobacco. He reports that he does not drink alcohol or use illicit drugs.  Family History  Problem Relation Age of Onset  . Lung cancer      was never a smoker    Medications: Patient's Medications  New Prescriptions   No medications on file  Previous Medications   ACETAMINOPHEN (TYLENOL) 500 MG TABLET    Take 1,000 mg by mouth every 6 (six) hours as needed.    ALBUTEROL (PROVENTIL) (2.5 MG/3ML) 0.083% NEBULIZER SOLUTION    Take 3 mLs (2.5 mg total) by nebulization every 6 (six) hours as needed for wheezing  or shortness of breath.   ALPRAZOLAM (XANAX) 0.25 MG TABLET    Take 0.25 mg by mouth at bedtime.    ASPIRIN 81 MG TABLET    Take 81 mg by mouth daily.     ATORVASTATIN (LIPITOR) 10 MG TABLET    Take 10 mg by mouth daily at 6 PM.    BISACODYL (DULCOLAX) 10 MG SUPPOSITORY    Place 10 mg rectally daily as needed for moderate constipation or severe constipation.   CHOLECALCIFEROL (VITAMIN D) 1000 UNITS TABLET    Take 2,000  Units by mouth daily.    DILTIAZEM (CARDIZEM CD) 180 MG 24 HR CAPSULE    Take 180 mg by mouth daily.   DULOXETINE (CYMBALTA) 30 MG CAPSULE    Take 30 mg by mouth every evening.    FLUTICASONE (FLONASE) 50 MCG/ACT NASAL SPRAY    Place 2 sprays into the nose daily.     GABAPENTIN (NEURONTIN) 100 MG CAPSULE    Take 100 mg by mouth 3 (three) times daily.   LATANOPROST (XALATAN) 0.005 % OPHTHALMIC SOLUTION    Place 1 drop into both eyes every evening.    LEVOTHYROXINE (SYNTHROID, LEVOTHROID) 100 MCG TABLET    Take 50-100 mcg by mouth daily before breakfast. Takes 57mcg on sun and mon Takes 120mcg all other days   LOSARTAN (COZAAR) 50 MG TABLET    Take 50 mg by mouth daily.   METFORMIN (GLUCOPHAGE) 500 MG TABLET    Take 500 mg by mouth 2 (two) times daily with a meal.    OMEPRAZOLE (PRILOSEC) 20 MG CAPSULE    Take 20 mg by mouth daily.     OXYCODONE-ACETAMINOPHEN (PERCOCET) 7.5-325 MG PER TABLET    Take 1 tablet by mouth every 4 (four) hours as needed for pain.   OXYGEN    Inhale 2.5 L into the lungs continuous.   POLYETHYLENE GLYCOL (MIRALAX / GLYCOLAX) PACKET    Take 17 g by mouth daily.     PREDNISONE (DELTASONE) 20 MG TABLET    Take 1 tablet (20 mg total) by mouth daily with breakfast.   SENNOSIDES-DOCUSATE SODIUM (SENOKOT-S) 8.6-50 MG TABLET    Take 2 tablets by mouth daily.  Modified Medications   No medications on file  Discontinued Medications   No medications on file     Physical Exam: Filed Vitals:   03/10/14 0642  BP: 130/67  Pulse: 68  Temp: 97.3 F (36.3 C)  Resp: 18  SpO2: 97%    General- elderly obese male, in no acute distress Head- normocephalic, atraumatic Throat- moist mucus membrane Eyes- PERRLA, EOMI, no pallor, no icterus, no discharge, normal conjunctiva, normal sclera Neck- no cervical lymphadenopathy Cardiovascular- normal s1,s2, no murmurs, palpable dorsalis pedis and radial pulses, 1+ leg edema Respiratory- bilateral clear to auscultation, no wheeze, no  rhonchi, no crackles, no use of accessory muscles Abdomen- bowel sounds present, soft, non tender Musculoskeletal- able to move all 4 extremities with generalized weakness but left leg weakness is prominent with strength of 3/5. Wheels himself on a wheelchair  Neurological- no focal deficit Skin- warm and dry Psychiatry- alert and oriented to person, place and time, normal mood and affect   Labs reviewed: Basic Metabolic Panel:  Recent Labs  03/02/14 1421 03/03/14 0545  NA 139 137  K 5.2* 5.0  CL 98 101  CO2  --  29  GLUCOSE 122* 249*  BUN 22 16  CREATININE 0.90 0.89  CALCIUM  --  9.4   Liver  Function Tests:  Recent Labs  03/03/14 0545  AST 16  ALT 13  ALKPHOS 71  BILITOT 0.6  PROT 7.2  ALBUMIN 3.5   No results for input(s): LIPASE, AMYLASE in the last 8760 hours. No results for input(s): AMMONIA in the last 8760 hours. CBC:  Recent Labs  03/02/14 1411 03/02/14 1421 03/03/14 0545  WBC 12.4*  --  11.1*  NEUTROABS 8.7*  --   --   HGB 12.8* 14.6 12.7*  HCT 40.0 43.0 40.1  MCV 97.8  --  97.8  PLT 213  --  206   Cardiac Enzymes: No results for input(s): CKTOTAL, CKMB, CKMBINDEX, TROPONINI in the last 8760 hours. BNP: Invalid input(s): POCBNP CBG:  Recent Labs  03/06/14 2141 03/07/14 0732 03/07/14 1236  GLUCAP 139* 97 135*    Radiological Exams:  03/02/14: MR lumbar spine 1. New 10 mm enhancing lesion within the posterior body of S1. This raises concern for a focal metastasis. Whole-body bone scan may be used for further evaluation of the entire skeleton. 2. Interval laminectomy at L5 with cord progressive facet hypertrophy and expected epidural granulation tissue. 3. Progressive left subarticular and foraminal stenosis at L2-3 secondary to a progressive leftward disc protrusion. 4. Similar appearance of laminectomy and mild left foraminal stenosis at L3-4. 5. Similar appearance of laminectomy with mild subarticular and foraminal narrowing bilaterally  at L4-5, worse on the right.  03/03/14: Nuclear Med whole body bone scan 1. There is no abnormal uptake within the skull, spine, ribs, or pectoral girdle, or pelvis. 2. There is moderately increased uptake in the left knee with mildly increased uptake in the right knee most compatible with degenerative change. Plain films of the knees are recommended.  03/03/14: DG Left Hip: No acute findings. Mild symmetric degenerative changes of the hips. Diffuse decreased ]bone mineralization.   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 Cord compression, severe stenosis have been ruled out. Will schedule neurosurgery consult for further evaluation. Will have him work with physical therapy and occupational therapy team to help with gait training and muscle strengthening exercises.fall precautions. Skin care. Encourage to be out of bed. Continue and complete prednisone taper. Continue tylenol 1000mg  every six hours as needed and continue percocet 7.5/325mg  every four hours as needed for pain. Add ted hose to help with dependent edema  Constipation continue miralax, d/c senna s and start home regimen dulcolax 5 mg daily in the am. Monitor clinically  HTN Stable. Continue diltiazem 180mg  daily with losartan 50mg  daily   type 2 diabetes mellitus Recent a1c 7.8. Continue metformin 500mg  twice daily with gabapentin 100mg  three times daily for neuropathic pain. Monitor cbg.   OSA Continue o2 at night  Atrial flutter Rate controlled.continue diltiazem 180mg  daily and monitor. Continue lipitor  Depression with anxiety Continue cymbalta 30mg  daily with xanax 0.25mg  daily at bedtime.   Hypothyroidism Continue levothyroxine 51mcg on Sunday and Monday and 144mcg daily on all other days. Continue to monitor   Goals of care: short term rehabilitation   Labs/tests ordered: cbc, bmp  Family/ staff Communication: reviewed care plan with patient and  nursing supervisor    Blanchie Serve, MD  [facility] [phone] (Monday-Friday 8 am - 5 pm) [phone] (afterhours)

## 2014-03-16 ENCOUNTER — Non-Acute Institutional Stay (SKILLED_NURSING_FACILITY): Payer: Medicare Other | Admitting: Registered Nurse

## 2014-03-16 DIAGNOSIS — I739 Peripheral vascular disease, unspecified: Secondary | ICD-10-CM

## 2014-03-16 NOTE — Progress Notes (Signed)
Patient ID: Taylor [name], male   DOB: April 12, 1928, 81 y.o.   MRN: [id number]   Place of Service: [facility]  Allergies  Allergen Reactions  . Amoxicillin Palpitations  . Motrin [Ibuprofen] Palpitations  . Chantix [Varenicline Tartrate] Other (See Comments)    depression    Code Status: DNR  Goals of Care: Comfort and Quality of Life/STR  Chief Complaint  Patient presents with  . Acute Visit    LLE swelling and pain    HPI 81 y.o. male with PMH of HTN, HLD, OSA, hypothyroidism, atrial fibrillation, glaucoma, anxiety, bladder cancer among many others is being seen for an acute visit at the request of PTA and DON for the evaluation of persistent eft leg swelling, erythema, and pain. He was previously seen on 03/10/14 for the same issues and was place abx for cellulitis. Repeated Venous doppler negative for DVT. Seen in room today. Patient has chronic pain of LLE, but reported that pain is worsening with standing and ambulation. He noted that swelling is better when in bed. Reported hx of varicose vein and has seen vascular surgery every other year in the past. He denies any fever, chills, dizziness, headache, chest pain, shortness of breath, abdominal pain, nausea and vomiting. ROS otherwise unremarkable.    Past Medical History  Diagnosis Date  . Hypertension   . Hyperlipidemia   . Hypothyroidism   . COPD (chronic obstructive pulmonary disease)   . Atrial fibrillation   . Glaucoma   . Anxiety   . History of bladder cancer   . Lung nodule     SMALL  . OSA (obstructive sleep apnea)     Past Surgical History  Procedure Laterality Date  . Transurethral resection of prostate    . Cardiovascular stress test  12/25/2007    EF 64%. NO EVIDENCE OF ISCHEMIA  . Back surgery  10-18-11  . Cystoscopy N/A 12/15    History  Substance Use Topics  . Smoking status: Former Smoker -- 1.50 packs/day for 60 years    Types: Cigarettes    Quit date: 11/27/2010  . Smokeless  tobacco: Never Used  . Alcohol Use: No    Family History  Problem Relation Age of Onset  . Lung cancer      was never a smoker      Medication List       This list is accurate as of: 03/16/14  4:20 PM.  Always use your most recent med list.               acetaminophen 500 MG tablet  Commonly known as:  TYLENOL  Take 1,000 mg by mouth every 6 (six) hours as needed.     albuterol (2.5 MG/3ML) 0.083% nebulizer solution  Commonly known as:  PROVENTIL  Take 3 mLs (2.5 mg total) by nebulization every 6 (six) hours as needed for wheezing or shortness of breath.     ALPRAZolam 0.25 MG tablet  Commonly known as:  XANAX  Take 0.25 mg by mouth at bedtime.     aspirin 81 MG tablet  Take 81 mg by mouth daily.     atorvastatin 10 MG tablet  Commonly known as:  LIPITOR  Take 10 mg by mouth daily at 6 PM.     bisacodyl 10 MG suppository  Commonly known as:  DULCOLAX  Place 10 mg rectally daily as needed for moderate constipation or severe constipation.     cholecalciferol 1000 UNITS tablet  Commonly known  as:  VITAMIN D  Take 2,000 Units by mouth daily.     diltiazem 180 MG 24 hr capsule  Commonly known as:  CARDIZEM CD  Take 180 mg by mouth daily.     DULoxetine 30 MG capsule  Commonly known as:  CYMBALTA  Take 30 mg by mouth every evening.     fluticasone 50 MCG/ACT nasal spray  Commonly known as:  FLONASE  Place 2 sprays into the nose daily.     gabapentin 100 MG capsule  Commonly known as:  NEURONTIN  Take 100 mg by mouth 3 (three) times daily.     latanoprost 0.005 % ophthalmic solution  Commonly known as:  XALATAN  Place 1 drop into both eyes every evening.     levothyroxine 100 MCG tablet  Commonly known as:  SYNTHROID, LEVOTHROID  - Take 50-100 mcg by mouth daily before breakfast. Takes 13mcg on sun and mon  - Takes 133mcg all other days     losartan 50 MG tablet  Commonly known as:  COZAAR  Take 50 mg by mouth daily.     metFORMIN 500 MG tablet    Commonly known as:  GLUCOPHAGE  Take 500 mg by mouth 2 (two) times daily with a meal.     omeprazole 20 MG capsule  Commonly known as:  PRILOSEC  Take 20 mg by mouth daily.     oxyCODONE-acetaminophen 7.5-325 MG per tablet  Commonly known as:  PERCOCET  Take 1 tablet by mouth every 4 (four) hours as needed for pain.     OXYGEN  Inhale 2.5 L into the lungs continuous.     polyethylene glycol packet  Commonly known as:  MIRALAX / GLYCOLAX  Take 17 g by mouth daily.     predniSONE 20 MG tablet  Commonly known as:  DELTASONE  Take 1 tablet (20 mg total) by mouth daily with breakfast.     sennosides-docusate sodium 8.6-50 MG tablet  Commonly known as:  SENOKOT-S  Take 2 tablets by mouth daily.        Physical Exam  BP 128/76 mmHg  Pulse 59  Temp(Src) 98.4 F (36.9 C)  Resp 20  Constitutional: Obese elderly male in no acute distress. Conversant and pleasant HEENT: PERRL. EOM intact. No icterus. Oral mucosa moist.  Neck: Supple and nontender. No lymphadenopathy, masses, or thyromegaly. No JVD or carotid bruits. Cardiac: Normal S1, S2. RRR without appreciable murmurs, rubs, or gallops. Distal pulses diminished. 2+ pitting edema of LLE. No to trace edema of RLE.  Lungs: No respiratory distress. Breath sounds clear bilaterally without rales, rhonchi, or wheezes. Abdomen: Audible bowel sounds in all quadrants. Soft, nontender, nondistended. No palpable mass.  Musculoskeletal: able to move all extremities. Generalized weakness present.  Skin: Left lower leg swelling very cool to touch. Skin shiny/leathery, hairless, with some erythema and brown  hyperpigmentation involving the lower left leg. Slightly tender to touch. Diffuse varicose and spider veins noted on BLE L>R.  Neurological: Alert and oriented to person, place, and time. No focal deficits.  Psychiatric: Judgment and insight adequate. Appropriate mood and affect.   Labs Reviewed  CBC Latest Ref Rng 03/03/2014 03/02/2014  03/02/2014  WBC 4.0 - 10.5 K/uL 11.1(H) - 12.4(H)  Hemoglobin 13.0 - 17.0 g/dL 12.7(L) 14.6 12.8(L)  Hematocrit 39.0 - 52.0 % 40.1 43.0 40.0  Platelets 150 - 400 K/uL 206 - 213    CMP Latest Ref Rng 03/03/2014 03/02/2014 12/01/2010  Glucose 70 - 99 mg/dL 249(H) 122(H)  213(H)  BUN 6 - 23 mg/dL 16 22 16   Creatinine 0.50 - 1.35 mg/dL 0.89 0.90 0.62  Sodium 135 - 145 mmol/L 137 139 139  Potassium 3.5 - 5.1 mmol/L 5.0 5.2(H) 3.5  Chloride 96 - 112 mmol/L 101 98 100  CO2 19 - 32 mmol/L 29 - 30  Calcium 8.4 - 10.5 mg/dL 9.4 - 8.7  Total Protein 6.0 - 8.3 g/dL 7.2 - 6.2  Total Bilirubin 0.3 - 1.2 mg/dL 0.6 - 0.4  Alkaline Phos 39 - 117 U/L 71 - 72  AST 0 - 37 U/L 16 - 20  ALT 0 - 53 U/L 13 - 19    Lab Results  Component Value Date   HGBA1C 7.8* 03/04/2014    Lab Results  Component Value Date   TSH 0.282* 11/27/2010   Assessment & Plan 1. PVD (peripheral vascular disease) Venous doppler negative for DVT x 2. Continue and complete abx (1 day left) for LLE cellulitis. LLE swelling and pain most likely secondary to chronic venous insufficiency and potential arterial insufficiency. Will order ABI. Encourage elevating BLE while in bed, exercise as tolerated, and TED hose to LLE. Continue to monitor his status.    Family/Staff Communication Plan of care discussed with patient and nursing staff. Patient and nursing staff verbalized understanding and agree with plan of care. No additional questions or concerns reported.    Arthur Holms, MSN, AGNP-C [facility] [address] [place], [place] [address] [phone] [8am-5pm] After hours: [phone]

## 2014-04-05 ENCOUNTER — Other Ambulatory Visit: Payer: Self-pay | Admitting: *Deleted

## 2014-04-05 MED ORDER — ALPRAZOLAM 0.25 MG PO TABS: ORAL_TABLET | ORAL | Status: DC

## 2014-04-05 NOTE — Telephone Encounter (Signed)
[facility] 

## 2014-04-07 ENCOUNTER — Non-Acute Institutional Stay (SKILLED_NURSING_FACILITY): Payer: Medicare Other | Admitting: Registered Nurse

## 2014-04-07 ENCOUNTER — Encounter: Payer: Self-pay | Admitting: Registered Nurse

## 2014-04-07 DIAGNOSIS — M25562 Pain in left knee: Secondary | ICD-10-CM | POA: Diagnosis not present

## 2014-04-07 NOTE — Progress Notes (Signed)
Patient ID: Taylor [name], male   DOB: 11-02-1928, 81 y.o.   MRN: [id number]   Place of Service: [facility]  Allergies  Allergen Reactions  . Amoxicillin Palpitations  . Motrin [Ibuprofen] Palpitations  . Chantix [Varenicline Tartrate] Other (See Comments)    depression    Code Status: DNR  Goals of Care: Comfort and Quality of Life/STR  Chief Complaint  Patient presents with  . Acute Visit    Left knee pain    HPI 81 y.o. male with PMH of HTN, HLD, OSA, hypothyroidism, atrial fibrillation, glaucoma, anxiety, bladder cancer among many others is being seen for an acute visit for the evaluation of increased left knee pain. He was recently diagnosed with left lateral condyle fracture by ortho couple weeks ago. Has worsening of left knee pain over the past two days. Therapy team is concerned about possible worsening of existing fracture. Seen in room today. Reported having left knee pain, worse with movement. Denies any other concerns   Past Medical History  Diagnosis Date  . Hypertension   . Hyperlipidemia   . Hypothyroidism   . COPD (chronic obstructive pulmonary disease)   . Atrial fibrillation   . Glaucoma   . Anxiety   . History of bladder cancer   . Lung nodule     SMALL  . OSA (obstructive sleep apnea)     Past Surgical History  Procedure Laterality Date  . Transurethral resection of prostate    . Cardiovascular stress test  12/25/2007    EF 64%. NO EVIDENCE OF ISCHEMIA  . Back surgery  10-18-11  . Cystoscopy N/A 12/15    History  Substance Use Topics  . Smoking status: Former Smoker -- 1.50 packs/day for 60 years    Types: Cigarettes    Quit date: 11/27/2010  . Smokeless tobacco: Never Used  . Alcohol Use: No    Family History  Problem Relation Age of Onset  . Lung cancer      was never a smoker      Medication List       This list is accurate as of: 04/07/14  3:59 PM.  Always use your most recent med list.               acetaminophen 500 MG tablet  Commonly known as:  TYLENOL  Take 1,000 mg by mouth every 6 (six) hours as needed.     albuterol (2.5 MG/3ML) 0.083% nebulizer solution  Commonly known as:  PROVENTIL  Take 3 mLs (2.5 mg total) by nebulization every 6 (six) hours as needed for wheezing or shortness of breath.     ALPRAZolam 0.25 MG tablet  Commonly known as:  XANAX  Take one tablet by mouth every night at bedtime for anxiety     aspirin 81 MG tablet  Take 81 mg by mouth daily.     atorvastatin 10 MG tablet  Commonly known as:  LIPITOR  Take 10 mg by mouth daily at 6 PM.     bisacodyl 10 MG suppository  Commonly known as:  DULCOLAX  Place 10 mg rectally daily as needed for moderate constipation or severe constipation.     cholecalciferol 1000 UNITS tablet  Commonly known as:  VITAMIN D  Take 2,000 Units by mouth daily.     diltiazem 180 MG 24 hr capsule  Commonly known as:  CARDIZEM CD  Take 180 mg by mouth daily.     DULoxetine 30 MG capsule  Commonly  known as:  CYMBALTA  Take 30 mg by mouth every evening.     fluticasone 50 MCG/ACT nasal spray  Commonly known as:  FLONASE  Place 2 sprays into the nose daily.     gabapentin 100 MG capsule  Commonly known as:  NEURONTIN  Take 100 mg by mouth 3 (three) times daily.     latanoprost 0.005 % ophthalmic solution  Commonly known as:  XALATAN  Place 1 drop into both eyes every evening.     levothyroxine 100 MCG tablet  Commonly known as:  SYNTHROID, LEVOTHROID  - Take 50-100 mcg by mouth daily before breakfast. Takes 35mcg on sun and mon  - Takes 12mcg all other days     losartan 50 MG tablet  Commonly known as:  COZAAR  Take 50 mg by mouth daily.     metFORMIN 500 MG tablet  Commonly known as:  GLUCOPHAGE  Take 500 mg by mouth 2 (two) times daily with a meal.     omeprazole 20 MG capsule  Commonly known as:  PRILOSEC  Take 20 mg by mouth daily.     oxyCODONE-acetaminophen 7.5-325 MG per tablet  Commonly known  as:  PERCOCET  Take 1 tablet by mouth every 4 (four) hours as needed for pain.     OXYGEN  Inhale 2.5 L into the lungs continuous.     polyethylene glycol packet  Commonly known as:  MIRALAX / GLYCOLAX  Take 17 g by mouth daily.     predniSONE 20 MG tablet  Commonly known as:  DELTASONE  Take 1 tablet (20 mg total) by mouth daily with breakfast.     sennosides-docusate sodium 8.6-50 MG tablet  Commonly known as:  SENOKOT-S  Take 2 tablets by mouth daily.        Physical Exam  BP 126/70 mmHg  Pulse 72  Temp(Src) 96.7 F (35.9 C)  Resp 14  Constitutional: Obese elderly male in no acute distress. Conversant and pleasant HEENT: PERRL. EOM intact. No icterus. Oral mucosa moist.  Neck: Supple and nontender. No lymphadenopathy, masses, or thyromegaly. No JVD or carotid bruits. Cardiac: Normal S1, S2. RRR without appreciable murmurs, rubs, or gallops. Distal pulses diminished. 1+ pitting edema of LLE. Trace edema of RLE. Ted hose in place on LLE Lungs: No respiratory distress. Breath sounds clear bilaterally without rales, rhonchi, or wheezes. Abdomen: Audible bowel sounds in all quadrants. Soft, nontender, nondistended. No palpable mass.  Musculoskeletal: able to move all extremities. Generalized weakness present.  Left knee slightly edematous and tender to palpation.  Skin: Warm and dry.  Neurological: Alert and oriented to person, place, and time. No focal deficits.  Psychiatric: Judgment and insight adequate. Appropriate mood and affect.   Labs Reviewed  CBC Latest Ref Rng 03/03/2014 03/02/2014 03/02/2014  WBC 4.0 - 10.5 K/uL 11.1(H) - 12.4(H)  Hemoglobin 13.0 - 17.0 g/dL 12.7(L) 14.6 12.8(L)  Hematocrit 39.0 - 52.0 % 40.1 43.0 40.0  Platelets 150 - 400 K/uL 206 - 213    CMP Latest Ref Rng 03/03/2014 03/02/2014 12/01/2010  Glucose 70 - 99 mg/dL 249(H) 122(H) 213(H)  BUN 6 - 23 mg/dL 16 22 16   Creatinine 0.50 - 1.35 mg/dL 0.89 0.90 0.62  Sodium 135 - 145 mmol/L 137 139 139    Potassium 3.5 - 5.1 mmol/L 5.0 5.2(H) 3.5  Chloride 96 - 112 mmol/L 101 98 100  CO2 19 - 32 mmol/L 29 - 30  Calcium 8.4 - 10.5 mg/dL 9.4 - 8.7  Total  Protein 6.0 - 8.3 g/dL 7.2 - 6.2  Total Bilirubin 0.3 - 1.2 mg/dL 0.6 - 0.4  Alkaline Phos 39 - 117 U/L 71 - 72  AST 0 - 37 U/L 16 - 20  ALT 0 - 53 U/L 13 - 19    Lab Results  Component Value Date   HGBA1C 7.8* 03/04/2014    Assessment & Plan 1. Left knee pain Worsening of pain over the past 2 days. Could be r/t to combination of OA, therapy, and existing lateral condyle fracture. Was recently diagnosed with left lateral condyle fx by ortho. Will order Left knee xray for further evaluation and to rule out worsening of fracture. Continue tylenol 1000mg  every six hours as needed and percocet 7.5/325mg  every four as needed for pain (do not exceed 4g tylenol in 24 hrs period). Continue to monitor his status.    Family/Staff Communication Plan of care discussed with patient and nursing staff. Patient and nursing staff verbalized understanding and agree with plan of care. No additional questions or concerns reported.    Arthur Holms, MSN, AGNP-C [facility] [address] [place], [place] [address] [phone] [8am-5pm] After hours: [phone]

## 2014-04-11 ENCOUNTER — Encounter: Payer: Self-pay | Admitting: Registered Nurse

## 2014-04-11 ENCOUNTER — Non-Acute Institutional Stay (SKILLED_NURSING_FACILITY): Payer: Medicare Other | Admitting: Registered Nurse

## 2014-04-11 DIAGNOSIS — I4891 Unspecified atrial fibrillation: Secondary | ICD-10-CM

## 2014-04-11 DIAGNOSIS — E785 Hyperlipidemia, unspecified: Secondary | ICD-10-CM

## 2014-04-11 DIAGNOSIS — M1712 Unilateral primary osteoarthritis, left knee: Secondary | ICD-10-CM | POA: Diagnosis not present

## 2014-04-11 DIAGNOSIS — F418 Other specified anxiety disorders: Secondary | ICD-10-CM | POA: Diagnosis not present

## 2014-04-11 DIAGNOSIS — J449 Chronic obstructive pulmonary disease, unspecified: Secondary | ICD-10-CM | POA: Diagnosis not present

## 2014-04-11 DIAGNOSIS — M79602 Pain in left arm: Secondary | ICD-10-CM

## 2014-04-11 DIAGNOSIS — I739 Peripheral vascular disease, unspecified: Secondary | ICD-10-CM

## 2014-04-11 DIAGNOSIS — E039 Hypothyroidism, unspecified: Secondary | ICD-10-CM

## 2014-04-11 DIAGNOSIS — M79605 Pain in left leg: Secondary | ICD-10-CM

## 2014-04-11 DIAGNOSIS — H409 Unspecified glaucoma: Secondary | ICD-10-CM | POA: Diagnosis not present

## 2014-04-11 DIAGNOSIS — E1143 Type 2 diabetes mellitus with diabetic autonomic (poly)neuropathy: Secondary | ICD-10-CM

## 2014-04-11 DIAGNOSIS — G4733 Obstructive sleep apnea (adult) (pediatric): Secondary | ICD-10-CM | POA: Diagnosis not present

## 2014-04-11 DIAGNOSIS — G8929 Other chronic pain: Secondary | ICD-10-CM

## 2014-04-11 DIAGNOSIS — I10 Essential (primary) hypertension: Secondary | ICD-10-CM | POA: Diagnosis not present

## 2014-04-11 DIAGNOSIS — S72425S Nondisplaced fracture of lateral condyle of left femur, sequela: Secondary | ICD-10-CM | POA: Diagnosis not present

## 2014-04-11 DIAGNOSIS — K5900 Constipation, unspecified: Secondary | ICD-10-CM

## 2014-04-11 NOTE — Progress Notes (Signed)
Patient ID: Taylor [name], male   DOB: 12-Jan-1929, 81 y.o.   MRN: [id number]   Place of Service: [facility]  Allergies  Allergen Reactions  . Amoxicillin Palpitations  . Motrin [Ibuprofen] Palpitations  . Chantix [Varenicline Tartrate] Other (See Comments)    depression    Code Status: DNR  Goals of Care: Comfort and Quality of Life/STR  Chief Complaint  Patient presents with  . Discharge Note    HPI 81 y.o. male with PMH of HTN, HLD, OSA, hypothyroidism, atrial fibrillation, glaucoma, anxiety, bladder cancer among many others is being seen for a discharge visit. He was here for short term rehabilitation post hospital admission from 03/02/14 to 03/07/14 for left leg pain and weakness. MRI did not show a "surgical target" for intervention. Questionable femoral nerve mononeuropathy and outpatient nerve conduction studies for further evaluation was suggested. Was found to have left lateral condyle of femur fracture by ortho. He has worked well with therapy team is ready to be discharged home with [facility] PT/OT. Seen in room today. Reported having intermittent left knee pain, worse with movement and better with rest. Repeat Left knee xray did not show any acute processes/fractures.   Review of Systems Constitutional: Negative for fever and chills HENT: Negative for ear pain, congestion, and sore throat Eyes: Negative for eye pain, eye discharge, and visual disturbance  Cardiovascular: Negative for chest pain and palpitations. Positive of LLE swelling (improved).  Respiratory: Negative cough, shortness of breath, and wheezing.  Gastrointestinal: Negative for nausea and vomiting. Negative for abdominal pain and diarrhea. Positive for constipation. Genitourinary: Negative for dysuria and hematuria Endocrine: Negative for polydipsia, polyphagia, and polyuria Musculoskeletal: Negative for back pain. Positive for knee pain. Neurological: Negative for dizziness, headache.  Skin: Negative  for rash and itchiness.   Psychiatric: Negative for depression  Past Medical History  Diagnosis Date  . Hypertension   . Hyperlipidemia   . Hypothyroidism   . COPD (chronic obstructive pulmonary disease)   . Atrial fibrillation   . Glaucoma   . Anxiety   . History of bladder cancer   . Lung nodule     SMALL  . OSA (obstructive sleep apnea)     Past Surgical History  Procedure Laterality Date  . Transurethral resection of prostate    . Cardiovascular stress test  12/25/2007    EF 64%. NO EVIDENCE OF ISCHEMIA  . Back surgery  10-18-11  . Cystoscopy N/A 12/15    History  Substance Use Topics  . Smoking status: Former Smoker -- 1.50 packs/day for 60 years    Types: Cigarettes    Quit date: 11/27/2010  . Smokeless tobacco: Never Used  . Alcohol Use: No    Family History  Problem Relation Age of Onset  . Lung cancer      was never a smoker      Medication List       This list is accurate as of: 04/11/14  8:51 PM.  Always use your most recent med list.               acetaminophen 500 MG tablet  Commonly known as:  TYLENOL  Take 1,000 mg by mouth every 6 (six) hours as needed.     albuterol 108 (90 BASE) MCG/ACT inhaler  Commonly known as:  PROVENTIL HFA;VENTOLIN HFA  Inhale 2 puffs into the lungs every 4 (four) hours as needed for wheezing or shortness of breath.     ALPRAZolam 0.25 MG tablet  Commonly known as:  XANAX  Take one tablet by mouth every night at bedtime for anxiety     aspirin 81 MG tablet  Take 81 mg by mouth daily.     atorvastatin 10 MG tablet  Commonly known as:  LIPITOR  Take 10 mg by mouth daily at 6 PM.     bisacodyl 10 MG suppository  Commonly known as:  DULCOLAX  Place 10 mg rectally daily as needed for moderate constipation or severe constipation.     cholecalciferol 1000 UNITS tablet  Commonly known as:  VITAMIN D  Take 2,000 Units by mouth daily.     diltiazem 180 MG 24 hr capsule  Commonly known as:  CARDIZEM CD  Take  180 mg by mouth daily.     DULoxetine 30 MG capsule  Commonly known as:  CYMBALTA  Take 30 mg by mouth every evening.     fluticasone 50 MCG/ACT nasal spray  Commonly known as:  FLONASE  Place 2 sprays into the nose daily.     gabapentin 100 MG capsule  Commonly known as:  NEURONTIN  Take 100 mg by mouth 3 (three) times daily.     latanoprost 0.005 % ophthalmic solution  Commonly known as:  XALATAN  Place 1 drop into both eyes every evening.     levothyroxine 100 MCG tablet  Commonly known as:  SYNTHROID, LEVOTHROID  - Take 50-100 mcg by mouth daily before breakfast. Takes 71mcg on sun and mon  - Takes 141mcg all other days     losartan 50 MG tablet  Commonly known as:  COZAAR  Take 50 mg by mouth daily.     metFORMIN 500 MG tablet  Commonly known as:  GLUCOPHAGE  Take 500 mg by mouth 2 (two) times daily with a meal.     omeprazole 20 MG capsule  Commonly known as:  PRILOSEC  Take 20 mg by mouth daily.     oxyCODONE-acetaminophen 7.5-325 MG per tablet  Commonly known as:  PERCOCET  Take 1 tablet by mouth every 4 (four) hours as needed for pain.     OXYGEN  Inhale 2.5 L into the lungs at bedtime. Only use oxygen at night     polyethylene glycol packet  Commonly known as:  MIRALAX / GLYCOLAX  Take 17 g by mouth daily.     predniSONE 5 MG tablet  Commonly known as:  DELTASONE  Take 5 mg by mouth daily with breakfast.     sennosides-docusate sodium 8.6-50 MG tablet  Commonly known as:  SENOKOT-S  Take 2 tablets by mouth daily.        Physical Exam  BP 111/71 mmHg  Pulse 76  Temp(Src) 97.2 F (36.2 C)  Resp 20  Ht 5\' 7"  (1.702 m)  Wt 220 lb 12.8 oz (100.154 kg)  BMI 34.57 kg/m2  SpO2 97%  Constitutional: Obese elderly male in no acute distress. Conversant and pleasant HEENT: Normocephalic and atraumatic. PERRL. EOM intact. No scleral icterus. Oral mucosa moist. Posterior pharynx clear of any exudate or lesions.  Neck: Supple and nontender. No  lymphadenopathy, masses, or thyromegaly. No JVD or carotid bruits. Cardiac: Normal S1, S2. RRR without appreciable murmurs, rubs, or gallops. Distal pulses intact. Trace pitting dependent edema of LLE. BLE TED hose in place.  Lungs: No respiratory distress. Breath sounds clear bilaterally without rales, rhonchi, or wheezes. Abdomen: Audible bowel sounds in all quadrants. Soft, nontender, nondistended.  Musculoskeletal: able to move all extremities. Generalized weakness and  limited LLE ROM present. Left knee joint slightly edematous and tender to palpation.  Skin: Warm and dry. No diaphoresis Neurological: Alert and oriented to person, place, and time. Marland Kitchen  Psychiatric: Judgment and insight adequate. Appropriate mood and affect.   Labs Reviewed  CBC Latest Ref Rng 03/09/2014 03/03/2014 03/02/2014  WBC - 14.4 11.1(H) -  Hemoglobin 13.5 - 17.5 g/dL 14.1 12.7(L) 14.6  Hematocrit 41 - 53 % 41 40.1 43.0  Platelets 150 - 399 K/L 164 206 -    CMP Latest Ref Rng 03/09/2014 03/03/2014 03/02/2014  Glucose 70 - 99 mg/dL - 249(H) 122(H)  BUN 4 - 21 mg/dL 24(A) 16 22  Creatinine 0.6 - 1.3 mg/dL 0.8 0.89 0.90  Sodium 137 - 147 mmol/L 140 137 139  Potassium 3.4 - 5.3 mmol/L 4.1 5.0 5.2(H)  Chloride 96 - 112 mmol/L - 101 98  CO2 19 - 32 mmol/L - 29 -  Calcium 8.4 - 10.5 mg/dL - 9.4 -  Total Protein 6.0 - 8.3 g/dL - 7.2 -  Total Bilirubin 0.3 - 1.2 mg/dL - 0.6 -  Alkaline Phos 39 - 117 U/L - 71 -  AST 0 - 37 U/L - 16 -  ALT 0 - 53 U/L - 13 -    Lab Results  Component Value Date   HGBA1C 7.8* 03/04/2014    Lab Results  Component Value Date   TSH 0.282* 11/27/2010    Diagnostic Studies Reviewed 03/02/14: MR lumbar spine 1. New 10 mm enhancing lesion within the posterior body of S1. This raises concern for a focal metastasis. Whole-body bone scan may be used for further evaluation of the entire skeleton. 2. Interval laminectomy at L5 with cord progressive facet hypertrophy and expected epidural  granulation tissue. 3. Progressive left subarticular and foraminal stenosis at L2-3 secondary to a progressive leftward disc protrusion. 4. Similar appearance of laminectomy and mild left foraminal stenosis at L3-4. 5. Similar appearance of laminectomy with mild subarticular and foraminal narrowing bilaterally at L4-5, worse on the right.  03/03/14: Nuclear Med whole body bone scan 1. There is no abnormal uptake within the skull, spine, ribs, or pectoral girdle, or pelvis. 2. There is moderately increased uptake in the left knee with mildly increased uptake in the right knee most compatible with degenerative change. Plain films of the knees are recommended.  03/03/14: DG Left Hip: No acute findings. Mild symmetric degenerative changes of the hips. Diffuse decreased bone mineralization.  Assessment & Plan 1. Essential hypertension BP stable. Continue diltiazem 180mg  daily with losartan 50mg  daily. F/u with pcp  2. OSA (obstructive sleep apnea) Per patient. Not on cpap at home and only use oxygen therapy at night.   3. Hypothyroidism, unspecified hypothyroidism type Continue levothyroxine 26mcg on Sunday and Monday and 128mcg daily on all other days. Continue to f/u with pcp  4. Hyperlipidemia Continue lipitor 10mg  daily.   5. Constipation, unspecified constipation type Stable. Continue miralax 17g daily, senna s 8.6/50mg  2 tabs daily, and dulcolax 10mg  supp daily prn for moderate to severe constipation. Encourage hydration and ambulation as tolerated.   6. Glaucoma Continue xalatan 0.005% 1 gtt to both eyes daily. Continue to monitor  7. Type 2 diabetes mellitus with autonomic neuropathy Recent a1c 7.8. CBG range 110-150s. Continue metformin 500mg  twice daily and gabapentin 100mg  three times daily for neuropathic pain. Continue to f/u with pcp.   8. Ongoing leg pain, left Questionable femoral nerve mononeuropathy? Continue prednisone 5mg  daily. Pain is mainly in left knee now.  Was  found to have left lateral condyle of femur fracture by ortho. Also has left knee OA. Continue tylenol 1000mg  to every six hours as needed with percocet 7.5/325mg  every four hours as needed for pain. Patient aware not to exceed >4g of tylenol in 24hr.   9. Left lateral condyle of femur fracture Stable. Continue to work with [facility] PT/OT for gait/strength/balance training to restore/maximize function. Continue to f/u with ortho as scheduled.   10. COPD, severity to be determined No respiratory distress. Continue albuterol hfa 2 puffs every four hours as needed for shob and wheezing. Continue oxygen via [place] at 2.5lmp at night.    11. Atrial fibrillation, unspecified Stable. RRR on exam. Continue diltiazem 180mg  daily.   12. Depression with anxiety Mood stable. Continue cymbalta 30mg  daily with xanax 0.25mg  daily at bedtime.   13. PVD LLE edema much improved. Continue TED hose while awake. Encourage patient to elevate feet as much as possible. F/u with pcp  14. Left knee OA Has had corticosteroid joint injection to left knee at recent ortho appt. Still has left knee pain despite use of pain medication. Recent knee xray showed nonspecific suprapatellar effusion and degenerative osteoarthritic changes, but no acute osseous abnormality. Continue to f/u with ortho as scheduled.   Home health services: PT/OT DME required:None PCP follow-up: Dr. Forde Dandy on 3/141/6 @9 :45am 30-day supply of prescription medications provided (#30 Norco 7.5/325mg )   Time spent: 40 minutes on care coordination and counseling.   Family/Staff Communication Plan of care discussed with patient and nursing staff. Patient and nursing staff verbalized understanding and agree with plan of care. No additional questions or concerns reported.    Arthur Holms, MSN, AGNP-C [facility] [address] [place], [place] [address] [phone] [8am-5pm] After hours: [phone]

## 2014-04-19 ENCOUNTER — Telehealth: Payer: Self-pay | Admitting: Oncology

## 2014-04-19 NOTE — Telephone Encounter (Signed)
CALLED [facility] FOR [facility] APPT.  SPOKE WITH COLLETTE.  [facility] HAS APPT FOR 03/22.  SPOKE WITH DAUGHTER WHO STATED HER DAD WOULD PROBABLY PREFER TO WAIT ON AN APPT WITH DR. SHADAD. HOWEVER, SHE STATED SHE WOULD SPEAK WITH HIM WHEN HE WOKE UP AND IF HE WANTED TO GO TO [place] SHE WOULD CALL BACK TO CANCEL THIS APPT.  I NOTIFIED HER THAT OUR SOONEST APPT WASN'T UNTIL April 19TH.    DX: [facility] INSUFFIENCY FX- LABS- MONOCLONAL PROTEIN IN GAMMA REGION, R/O MULTIPLE MYELOMA REFERRING: DR Layne Benton

## 2014-05-24 ENCOUNTER — Ambulatory Visit (HOSPITAL_COMMUNITY)
Admission: RE | Admit: 2014-05-24 | Discharge: 2014-05-24 | Disposition: A | Discharge status: Home / Self Care | Payer: Medicare Other | Source: Ambulatory Visit | Attending: Oncology | Admitting: Oncology

## 2014-05-24 ENCOUNTER — Telehealth: Payer: Self-pay | Admitting: Oncology

## 2014-05-24 ENCOUNTER — Other Ambulatory Visit: Payer: Medicare Other

## 2014-05-24 ENCOUNTER — Ambulatory Visit (HOSPITAL_BASED_OUTPATIENT_CLINIC_OR_DEPARTMENT_OTHER): Payer: Medicare Other | Admitting: Oncology

## 2014-05-24 ENCOUNTER — Other Ambulatory Visit (HOSPITAL_BASED_OUTPATIENT_CLINIC_OR_DEPARTMENT_OTHER): Payer: Medicare Other

## 2014-05-24 ENCOUNTER — Encounter: Payer: Self-pay | Admitting: Oncology

## 2014-05-24 ENCOUNTER — Ambulatory Visit: Payer: Medicare Other

## 2014-05-24 VITALS — BP 112/38 | HR 94 | Temp 97.6°F | Resp 18 | Ht 67.0 in | Wt 222.4 lb

## 2014-05-24 DIAGNOSIS — M25562 Pain in left knee: Secondary | ICD-10-CM | POA: Insufficient documentation

## 2014-05-24 DIAGNOSIS — M810 Age-related osteoporosis without current pathological fracture: Secondary | ICD-10-CM | POA: Diagnosis not present

## 2014-05-24 DIAGNOSIS — D729 Disorder of white blood cells, unspecified: Secondary | ICD-10-CM

## 2014-05-24 DIAGNOSIS — D472 Monoclonal gammopathy: Secondary | ICD-10-CM

## 2014-05-24 LAB — CBC WITH DIFFERENTIAL/PLATELET
BASO%: 0.3 % (ref 0.0–2.0)
Basophils Absolute: 0 10*3/uL (ref 0.0–0.1)
EOS ABS: 0.3 10*3/uL (ref 0.0–0.5)
EOS%: 3.4 % (ref 0.0–7.0)
HCT: 38.5 % (ref 38.4–49.9)
HGB: 12.1 g/dL — ABNORMAL LOW (ref 13.0–17.1)
LYMPH#: 2.1 10*3/uL (ref 0.9–3.3)
LYMPH%: 21.6 % (ref 14.0–49.0)
MCH: 31 pg (ref 27.2–33.4)
MCHC: 31.4 g/dL — ABNORMAL LOW (ref 32.0–36.0)
MCV: 98.7 fL — ABNORMAL HIGH (ref 79.3–98.0)
MONO#: 0.9 10*3/uL (ref 0.1–0.9)
MONO%: 9.3 % (ref 0.0–14.0)
NEUT#: 6.3 10*3/uL (ref 1.5–6.5)
NEUT%: 65.4 % (ref 39.0–75.0)
Platelets: 212 10*3/uL (ref 140–400)
RBC: 3.9 10*6/uL — AB (ref 4.20–5.82)
RDW: 13.9 % (ref 11.0–14.6)
WBC: 9.7 10*3/uL (ref 4.0–10.3)

## 2014-05-24 LAB — COMPREHENSIVE METABOLIC PANEL (CC13)
ALBUMIN: 3.6 g/dL (ref 3.5–5.0)
ALK PHOS: 70 U/L (ref 40–150)
ALT: 8 U/L (ref 0–55)
AST: 12 U/L (ref 5–34)
Anion Gap: 13 mEq/L — ABNORMAL HIGH (ref 3–11)
BUN: 17.6 mg/dL (ref 7.0–26.0)
CO2: 27 mEq/L (ref 22–29)
CREATININE: 0.9 mg/dL (ref 0.7–1.3)
Calcium: 9.5 mg/dL (ref 8.4–10.4)
Chloride: 101 mEq/L (ref 98–109)
EGFR: 80 mL/min/{1.73_m2} — AB (ref >90)
GLUCOSE: 118 mg/dL (ref 70–140)
Potassium: 5 mEq/L (ref 3.5–5.1)
Sodium: 140 mEq/L (ref 136–145)
Total Bilirubin: 0.41 mg/dL (ref 0.20–1.20)
Total Protein: 7.5 g/dL (ref 6.4–8.3)

## 2014-05-24 NOTE — Telephone Encounter (Signed)
Pt confirmed labs/ov per 04/19 POF, gave pt AVS and Calendar..... KJ

## 2014-05-24 NOTE — Progress Notes (Signed)
Checked in new pt with no financial concerns. °

## 2014-05-24 NOTE — Progress Notes (Signed)
Please see consult note.  

## 2014-05-24 NOTE — Consult Note (Signed)
Reason for Referral: Plasma cell disorder evaluation.   HPI: 81 year old gentleman with a history of hypertension, hypothyroidism referred to me for evaluation for a possible plasma cell disorder. He was in his usual state of health until about January 2016 where he presented with acute left knee pain, weakness and instability. He was hospitalized reported February 2016 and had an MRI of the lumbar spine on 03/02/2014. The MRI showed new 10 mm enhancing lesion in the posterior body of S1 there is suspicion for metastasis. But otherwise no other evidence of malignancy. There is progressive left supraclavicular foraminal stenosis and L2 and L3. A bone scan was obtained on 03/03/2014 and showed no evidence of malignancies. He was subsequently evaluated by orthopedic surgery and found to have minimally displaced lateral condyle insufficiency fracture of his left limb. He subsequently underwent DEXA scan which showed osteoporosis. He subsequently was evaluated by Dr. Layne Benton and a serum protein electrophoresis picked up on a monoclonal protein of 0.31 g/dL. Quantitative immunoglobulins were all elevated IgG,  IgM and IgA. Immunofixation picked up on IgM lambda monoclonal protein. His urine electrophoresis did not pick up on any monoclonal protein. During his hospitalization in January 2016, he had a normal hemoglobin and normal platelets normal chemistries calcium and kidney function. Patient referred to me for evaluation regarding these findings. Clinically, he still complains of left knee pain and problems with mobility. He does not report any constitutional symptoms of fevers or chills or sweats. His appetite has been reasonable have not lost any weight. Has not reported any pathological fractures. Did not report any peripheral neuropathy. He does not report any headaches, blurry vision, double vision or syncope. He does not report any chest pain, palpitation orthopnea. He does not report any cough, hemoptysis or  hematemesis. He does not report any nausea, vomiting, abdominal pain. Still report any hematochezia or melena. Does not report any frequency urgency or hesitancy. Remainder review of systems unremarkable.   Past Medical History  Diagnosis Date  . Hypertension   . Hyperlipidemia   . Hypothyroidism   . COPD (chronic obstructive pulmonary disease)   . Atrial fibrillation   . Glaucoma   . Anxiety   . History of bladder cancer   . Lung nodule     SMALL  . OSA (obstructive sleep apnea)   :  Past Surgical History  Procedure Laterality Date  . Transurethral resection of prostate    . Cardiovascular stress test  12/25/2007    EF 64%. NO EVIDENCE OF ISCHEMIA  . Back surgery  10-18-11  . Cystoscopy N/A 12/15  :   Current outpatient prescriptions:  .  acetaminophen (TYLENOL) 500 MG tablet, Take 1,000 mg by mouth every 6 (six) hours as needed., Disp: , Rfl:  .  albuterol (PROVENTIL HFA;VENTOLIN HFA) 108 (90 BASE) MCG/ACT inhaler, Inhale 2 puffs into the lungs every 4 (four) hours as needed for wheezing or shortness of breath., Disp: , Rfl:  .  ALPRAZolam (XANAX) 0.25 MG tablet, Take one tablet by mouth every night at bedtime for anxiety, Disp: 30 tablet, Rfl: 5 .  aspirin 81 MG tablet, Take 81 mg by mouth daily.  , Disp: , Rfl:  .  atorvastatin (LIPITOR) 10 MG tablet, Take 10 mg by mouth daily at 6 PM. , Disp: , Rfl:  .  bisacodyl (DULCOLAX) 10 MG suppository, Place 10 mg rectally daily as needed for moderate constipation or severe constipation., Disp: , Rfl:  .  cholecalciferol (VITAMIN D) 1000 UNITS tablet, Take 2,000  Units by mouth daily. , Disp: , Rfl:  .  diltiazem (CARDIZEM CD) 180 MG 24 hr capsule, Take 180 mg by mouth daily., Disp: , Rfl:  .  DULoxetine (CYMBALTA) 30 MG capsule, Take 30 mg by mouth every evening. , Disp: , Rfl:  .  fluticasone (FLONASE) 50 MCG/ACT nasal spray, Place 2 sprays into the nose daily.  , Disp: , Rfl:  .  gabapentin (NEURONTIN) 100 MG capsule, Take 100 mg by  mouth 3 (three) times daily., Disp: , Rfl:  .  latanoprost (XALATAN) 0.005 % ophthalmic solution, Place 1 drop into both eyes every evening. , Disp: , Rfl:  .  levothyroxine (SYNTHROID, LEVOTHROID) 100 MCG tablet, Take 50-100 mcg by mouth daily before breakfast. Takes 53mg on sun and mon Takes 1070m all other days, Disp: , Rfl:  .  losartan (COZAAR) 50 MG tablet, Take 50 mg by mouth daily., Disp: , Rfl:  .  metFORMIN (GLUCOPHAGE) 500 MG tablet, Take 500 mg by mouth 2 (two) times daily with a meal. , Disp: , Rfl:  .  omeprazole (PRILOSEC) 20 MG capsule, Take 20 mg by mouth daily.  , Disp: , Rfl:  .  oxyCODONE-acetaminophen (PERCOCET) 7.5-325 MG per tablet, Take 1 tablet by mouth every 4 (four) hours as needed for pain., Disp: 30 tablet, Rfl: 0 .  OXYGEN, Inhale 2.5 L into the lungs at bedtime. Only use oxygen at night, Disp: , Rfl:  .  polyethylene glycol (MIRALAX / GLYCOLAX) packet, Take 17 g by mouth daily.  , Disp: , Rfl:  .  predniSONE (DELTASONE) 5 MG tablet, Take 5 mg by mouth daily with breakfast., Disp: , Rfl:  .  sennosides-docusate sodium (SENOKOT-S) 8.6-50 MG tablet, Take 2 tablets by mouth daily., Disp: , Rfl: :  Allergies  Allergen Reactions  . Amoxicillin Palpitations  . Motrin [Ibuprofen] Palpitations  . Chantix [Varenicline Tartrate] Other (See Comments)    depression  :  Family History  Problem Relation Age of Onset  . Lung cancer      was never a smoker  :  History   Social History  . Marital Status: Divorced    Spouse Name: N/A  . Number of Children: 1  . Years of Education: N/A   Occupational History  . Retired     worked at a [employer] Social History Main Topics  . Smoking status: Former Smoker -- 1.50 packs/day for 60 years    Types: Cigarettes    Quit date: 11/27/2010  . Smokeless tobacco: Never Used  . Alcohol Use: No  . Drug Use: No  . Sexual Activity: No   Other Topics Concern  . Not on file   Social History Narrative   :  Pertinent items are noted in HPI.  Exam: ECOG 1 Blood pressure 112/38, pulse 94, temperature 97.6 F (36.4 C), temperature source Oral, resp. rate 18, height _0  (1.702 m), weight 222 lb 6.4 oz (100.88 kg), SpO2 92 %. General appearance: alert and cooperative Head: Normocephalic, without obvious abnormality Throat: lips, mucosa, and tongue normal; teeth and gums normal Neck: no adenopathy Back: negative Resp: clear to auscultation bilaterally Chest wall: no tenderness Cardio: regular rate and rhythm, S1, S2 normal, no murmur, click, rub or gallop GI: soft, non-tender; bowel sounds normal; no masses,  no organomegaly Extremities: extremities normal, atraumatic, no cyanosis or edema Pulses: 2+ and symmetric Skin: Skin color, texture, turgor normal. No rashes or lesions Lymph nodes: Cervical, supraclavicular, and axillary  nodes normal.  CBC    Component Value Date/Time   WBC 14.4 03/09/2014   WBC 11.1* 03/03/2014 0545   RBC 4.10* 03/03/2014 0545   HGB 14.1 03/09/2014   HCT 41 03/09/2014   PLT 164 03/09/2014   MCV 97.8 03/03/2014 0545   MCH 31.0 03/03/2014 0545   MCHC 31.7 03/03/2014 0545   RDW 13.4 03/03/2014 0545   LYMPHSABS 2.0 03/02/2014 1411   MONOABS 1.2* 03/02/2014 1411   EOSABS 0.4 03/02/2014 1411   BASOSABS 0.1 03/02/2014 1411    '   Chemistry      Component Value Date/Time   NA 140 03/09/2014   NA 137 03/03/2014 0545   K 4.1 03/09/2014   CL 101 03/03/2014 0545   CO2 29 03/03/2014 0545   BUN 24* 03/09/2014   BUN 16 03/03/2014 0545   CREATININE 0.8 03/09/2014   CREATININE 0.89 03/03/2014 0545   GLU 93 03/09/2014      Component Value Date/Time   CALCIUM 9.4 03/03/2014 0545   ALKPHOS 71 03/03/2014 0545   AST 16 03/03/2014 0545   ALT 13 03/03/2014 0545   BILITOT 0.6 03/03/2014 0545      Assessment and Plan:  81 year old gentleman with the following issues:  1. 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 His quantitative  immunoglobulins are all elevated with an IgG level of 1740, IgA level of 442 and IgM of 297. He had a normal CBC, chemistry including calcium and total protein level. The differential diagnosis includes plasma cell disorder such as monoclonal gammopathy of undetermined significance, but less likely multiple myeloma or amyloidosis. IgM myeloma is very unlikely at this time.  Lymphoproliferative disorder would be a possibility as well. Although the elevation in all quantitative immunoglobulins goes against having a clonal B-cell neoplasm.  This most likely represented a reactive polyclonal gammopathy related to arthritis, autoimmune disorder or other reasons.  to worked this up completely, I will repeat a serum protein electrophoresis as well as serum light chains. I will also obtain a skeletal survey to rule out lytic lesions. Given the fact that he had an MRI I see no reason to repeat imaging MRI or a CT scan looking for lymphoproliferative disorder. I see no reason for a bone marrow biopsy at this time.  If these studies are within normal range which I anticipate them to be, but observation surveillances all which recommended.  2. Osteoporosis and fracture of his lateral condyle: I doubt this is related to a plasma cell disorder. I have recommended treating his osteoporosis as well as this fracture at this time independent of his monoclonal protein findings.  All questions were answered today to his satisfaction.

## 2014-05-25 ENCOUNTER — Telehealth: Payer: Self-pay | Admitting: *Deleted

## 2014-05-25 NOTE — Telephone Encounter (Signed)
-----   Message from Wyatt Portela, MD sent at 05/25/2014  8:10 AM EDT ----- Please let him know Xrays are normal.

## 2014-05-25 NOTE — Telephone Encounter (Signed)
Spoke with patient, per dr [name], x-rays are normal.

## 2014-05-26 LAB — SPEP & IFE WITH QIG
ALBUMIN ELP: 3.6 g/dL — AB (ref 3.8–4.8)
ALPHA-2-GLOBULIN: 0.9 g/dL (ref 0.5–0.9)
Alpha-1-Globulin: 0.4 g/dL — ABNORMAL HIGH (ref 0.2–0.3)
Beta 2: 0.4 g/dL (ref 0.2–0.5)
Beta Globulin: 0.4 g/dL (ref 0.4–0.6)
Gamma Globulin: 1.4 g/dL (ref 0.8–1.7)
IGA: 269 mg/dL (ref 68–379)
IGM, SERUM: 195 mg/dL (ref 41–251)
IgG (Immunoglobin G), Serum: 1430 mg/dL (ref 650–1600)
TOTAL PROTEIN, SERUM ELECTROPHOR: 7.1 g/dL (ref 6.1–8.1)

## 2014-05-26 LAB — KAPPA/LAMBDA LIGHT CHAINS
KAPPA FREE LGHT CHN: 5.19 mg/dL — AB (ref 0.33–1.94)
Kappa:Lambda Ratio: 1.62 (ref 0.26–1.65)
Lambda Free Lght Chn: 3.2 mg/dL — ABNORMAL HIGH (ref 0.57–2.63)

## 2014-06-30 ENCOUNTER — Emergency Department (HOSPITAL_COMMUNITY): Payer: Medicare Other

## 2014-06-30 ENCOUNTER — Emergency Department (HOSPITAL_COMMUNITY)
Admission: EM | Admit: 2014-06-30 | Discharge: 2014-06-30 | Disposition: A | Discharge status: Home / Self Care | Payer: Medicare Other | Attending: Emergency Medicine | Admitting: Emergency Medicine

## 2014-06-30 ENCOUNTER — Encounter (HOSPITAL_COMMUNITY): Payer: Self-pay | Admitting: *Deleted

## 2014-06-30 DIAGNOSIS — Z7952 Long term (current) use of systemic steroids: Secondary | ICD-10-CM | POA: Diagnosis not present

## 2014-06-30 DIAGNOSIS — Z7982 Long term (current) use of aspirin: Secondary | ICD-10-CM | POA: Insufficient documentation

## 2014-06-30 DIAGNOSIS — F419 Anxiety disorder, unspecified: Secondary | ICD-10-CM | POA: Insufficient documentation

## 2014-06-30 DIAGNOSIS — E785 Hyperlipidemia, unspecified: Secondary | ICD-10-CM | POA: Insufficient documentation

## 2014-06-30 DIAGNOSIS — R1032 Left lower quadrant pain: Secondary | ICD-10-CM | POA: Diagnosis present

## 2014-06-30 DIAGNOSIS — R319 Hematuria, unspecified: Secondary | ICD-10-CM

## 2014-06-30 DIAGNOSIS — N201 Calculus of ureter: Secondary | ICD-10-CM | POA: Insufficient documentation

## 2014-06-30 DIAGNOSIS — I4891 Unspecified atrial fibrillation: Secondary | ICD-10-CM | POA: Insufficient documentation

## 2014-06-30 DIAGNOSIS — Z87891 Personal history of nicotine dependence: Secondary | ICD-10-CM | POA: Diagnosis not present

## 2014-06-30 DIAGNOSIS — I10 Essential (primary) hypertension: Secondary | ICD-10-CM | POA: Diagnosis not present

## 2014-06-30 DIAGNOSIS — Z8551 Personal history of malignant neoplasm of bladder: Secondary | ICD-10-CM | POA: Insufficient documentation

## 2014-06-30 DIAGNOSIS — H409 Unspecified glaucoma: Secondary | ICD-10-CM | POA: Diagnosis not present

## 2014-06-30 DIAGNOSIS — Z880 Allergy status to penicillin: Secondary | ICD-10-CM | POA: Insufficient documentation

## 2014-06-30 DIAGNOSIS — E039 Hypothyroidism, unspecified: Secondary | ICD-10-CM | POA: Diagnosis not present

## 2014-06-30 DIAGNOSIS — Z79899 Other long term (current) drug therapy: Secondary | ICD-10-CM | POA: Insufficient documentation

## 2014-06-30 DIAGNOSIS — J449 Chronic obstructive pulmonary disease, unspecified: Secondary | ICD-10-CM | POA: Insufficient documentation

## 2014-06-30 DIAGNOSIS — Z7951 Long term (current) use of inhaled steroids: Secondary | ICD-10-CM | POA: Diagnosis not present

## 2014-06-30 DIAGNOSIS — R109 Unspecified abdominal pain: Secondary | ICD-10-CM

## 2014-06-30 LAB — CBC WITH DIFFERENTIAL/PLATELET
BASOS ABS: 0 10*3/uL (ref 0.0–0.1)
Basophils Relative: 0 % (ref 0–1)
EOS PCT: 2 % (ref 0–5)
Eosinophils Absolute: 0.2 10*3/uL (ref 0.0–0.7)
HEMATOCRIT: 42.2 % (ref 39.0–52.0)
HEMOGLOBIN: 13.5 g/dL (ref 13.0–17.0)
LYMPHS ABS: 1.8 10*3/uL (ref 0.7–4.0)
Lymphocytes Relative: 17 % (ref 12–46)
MCH: 30.6 pg (ref 26.0–34.0)
MCHC: 32 g/dL (ref 30.0–36.0)
MCV: 95.7 fL (ref 78.0–100.0)
MONO ABS: 0.9 10*3/uL (ref 0.1–1.0)
MONOS PCT: 9 % (ref 3–12)
NEUTROS ABS: 7.3 10*3/uL (ref 1.7–7.7)
NEUTROS PCT: 72 % (ref 43–77)
PLATELETS: 214 10*3/uL (ref 150–400)
RBC: 4.41 MIL/uL (ref 4.22–5.81)
RDW: 13.3 % (ref 11.5–15.5)
WBC: 10.2 10*3/uL (ref 4.0–10.5)

## 2014-06-30 LAB — BASIC METABOLIC PANEL
Anion gap: 9 (ref 5–15)
BUN: 21 mg/dL — ABNORMAL HIGH (ref 6–20)
CALCIUM: 9.6 mg/dL (ref 8.9–10.3)
CO2: 28 mmol/L (ref 22–32)
Chloride: 103 mmol/L (ref 101–111)
Creatinine, Ser: 0.75 mg/dL (ref 0.61–1.24)
GFR calc non Af Amer: >60 mL/min (ref >60)
GLUCOSE: 115 mg/dL — AB (ref 65–99)
Potassium: 4.5 mmol/L (ref 3.5–5.1)
Sodium: 140 mmol/L (ref 135–145)

## 2014-06-30 LAB — URINE MICROSCOPIC-ADD ON

## 2014-06-30 LAB — URINALYSIS, ROUTINE W REFLEX MICROSCOPIC
BILIRUBIN URINE: NEGATIVE
Glucose, UA: NEGATIVE mg/dL
Ketones, ur: NEGATIVE mg/dL
Nitrite: NEGATIVE
Protein, ur: 30 mg/dL — AB
Specific Gravity, Urine: 1.015 (ref 1.005–1.030)
Urobilinogen, UA: 0.2 mg/dL (ref 0.0–1.0)
pH: 7.5 (ref 5.0–8.0)

## 2014-06-30 MED ORDER — TAMSULOSIN HCL 0.4 MG PO CAPS: 0.4000 mg | ORAL_CAPSULE | Freq: Every day | ORAL | Status: DC

## 2014-06-30 MED ORDER — MORPHINE SULFATE 4 MG/ML IJ SOLN
4.0000 mg | Freq: Once | INTRAMUSCULAR | Status: AC
  Administered 2014-06-30: 4 mg via INTRAVENOUS
  Filled 2014-06-30: qty 1

## 2014-06-30 MED ORDER — OXYCODONE-ACETAMINOPHEN 5-325 MG PO TABS: 1.0000 | ORAL_TABLET | ORAL | Status: DC | PRN

## 2014-06-30 MED ORDER — ONDANSETRON HCL 4 MG/2ML IJ SOLN
4.0000 mg | Freq: Once | INTRAMUSCULAR | Status: AC
  Administered 2014-06-30: 4 mg via INTRAVENOUS
  Filled 2014-06-30: qty 2

## 2014-06-30 MED ORDER — ONDANSETRON 4 MG PO TBDP: 4.0000 mg | ORAL_TABLET | Freq: Three times a day (TID) | ORAL | Status: DC | PRN

## 2014-06-30 NOTE — ED Notes (Signed)
Pt is in stable condition upon d/c and is escorted by staff from ED via wheelchair.

## 2014-06-30 NOTE — ED Notes (Signed)
Pt arrives from home via [facility]. Pt states he woke up at 0500 with LLQ pain and left flank pain. Pt is stating pain is a 8/10. Pt states he has had a hx of kidney stones, but doesn't feel like it was this painful. Pt denies urinary frequency, urgency or painful urination. Pt states his urine smells foul and also denies fever. Pt appears to be in no distress upon assessment. Pt is a&o x4.

## 2014-06-30 NOTE — Discharge Instructions (Signed)
Patient has 3.30m stone in left ureter which is causing his pain. Take the prescribed medication as directed--- please allow oxycodone every 4 hours instead of every 12 hours for the next few days until kidney stones passes. Follow-up with Dr. OKarsten Ro Return to the ED for new or worsening symptoms.

## 2014-06-30 NOTE — ED Provider Notes (Signed)
CSN: [id number]     Arrival date & time 06/30/14  3267 History   First MD Initiated Contact with Patient 06/30/14 [phone]     Chief Complaint  Patient presents with  . Abdominal Pain  . Flank Pain     (Consider location/radiation/quality/duration/timing/severity/associated sxs/prior Treatment) The history is provided by the patient and medical records.    This is an 81 year old male with past medical history significant for hypertension, hyperlipidemia, hypothyroidism, COPD, A. fib, history of bladder cancer, presenting to the ED for left flank pain. Patient states it began abruptly at 0500 this morning and woke him from sleep.  He states it radiates to left lower abdomen but does not extend into the groin or testicles.  He denies dysuria, but has developed hematuria and states his urine smells "file". He denies any fever, chills, or sweats. He does have history of kidney stones several years ago, but states they were not painful at all. He is followed by urology, Dr. Karsten Ro.  Past Medical History  Diagnosis Date  . Hypertension   . Hyperlipidemia   . Hypothyroidism   . COPD (chronic obstructive pulmonary disease)   . Atrial fibrillation   . Glaucoma   . Anxiety   . History of bladder cancer   . Lung nodule     SMALL  . OSA (obstructive sleep apnea)    Past Surgical History  Procedure Laterality Date  . Transurethral resection of prostate    . Cardiovascular stress test  12/25/2007    EF 64%. NO EVIDENCE OF ISCHEMIA  . Back surgery  10-18-11  . Cystoscopy N/A 12/15   Family History  Problem Relation Age of Onset  . Lung cancer      was never a smoker   History  Substance Use Topics  . Smoking status: Former Smoker -- 1.50 packs/day for 60 years    Types: Cigarettes    Quit date: 11/27/2010  . Smokeless tobacco: Never Used  . Alcohol Use: No    Review of Systems  Genitourinary: Positive for flank pain.  All other systems reviewed and are negative.     Allergies    Amoxicillin; Motrin; and Chantix  Home Medications   Prior to Admission medications   Medication Sig Start Date End Date Taking? Authorizing Provider  acetaminophen (TYLENOL) 500 MG tablet Take 1,000 mg by mouth every 6 (six) hours as needed.    Historical Provider, MD  albuterol (PROVENTIL HFA;VENTOLIN HFA) 108 (90 BASE) MCG/ACT inhaler Inhale 2 puffs into the lungs every 4 (four) hours as needed for wheezing or shortness of breath.    Historical Provider, MD  albuterol (PROVENTIL) (2.5 MG/3ML) 0.083% nebulizer solution Take 2.5 mg by nebulization every 6 (six) hours as needed. 04/25/14   Historical Provider, MD  alendronate (FOSAMAX) 70 MG tablet Take 70 mg by mouth once a week. 06/08/14   Historical Provider, MD  ALPRAZolam Duanne Moron) 0.25 MG tablet Take one tablet by mouth every night at bedtime for anxiety 04/05/14   Blanchie Serve, MD  ALPRAZolam Duanne Moron) 0.5 MG tablet Take 0.5 mg by mouth daily. 05/23/14   Historical Provider, MD  aspirin 81 MG tablet Take 81 mg by mouth daily.      Historical Provider, MD  atorvastatin (LIPITOR) 10 MG tablet Take 10 mg by mouth daily at 6 PM.     Historical Provider, MD  atorvastatin (LIPITOR) 20 MG tablet Take 10 mg by mouth daily. 06/08/14   Historical Provider, MD  bisacodyl (DULCOLAX) 10 MG  suppository Place 10 mg rectally daily as needed for moderate constipation or severe constipation.    Historical Provider, MD  cholecalciferol (VITAMIN D) 1000 UNITS tablet Take 2,000 Units by mouth daily.     Historical Provider, MD  diltiazem (CARDIZEM CD) 180 MG 24 hr capsule Take 180 mg by mouth daily.    Historical Provider, MD  DULoxetine (CYMBALTA) 30 MG capsule Take 30 mg by mouth every evening.     Historical Provider, MD  fluticasone (FLONASE) 50 MCG/ACT nasal spray Place 2 sprays into the nose daily.      Historical Provider, MD  gabapentin (NEURONTIN) 100 MG capsule Take 100 mg by mouth 3 (three) times daily.    Historical Provider, MD  latanoprost (XALATAN)  0.005 % ophthalmic solution Place 1 drop into both eyes every evening.     Historical Provider, MD  levothyroxine (SYNTHROID, LEVOTHROID) 100 MCG tablet Take 50-100 mcg by mouth daily before breakfast. Takes 7mg on sun and mon Takes 1029m all other days    Historical Provider, MD  losartan (COZAAR) 50 MG tablet Take 50 mg by mouth daily.    Historical Provider, MD  metFORMIN (GLUCOPHAGE) 500 MG tablet Take 500 mg by mouth 2 (two) times daily with a meal.     Historical Provider, MD  omeprazole (PRILOSEC) 20 MG capsule Take 20 mg by mouth daily.      Historical Provider, MD  oxyCODONE-acetaminophen (PERCOCET) 7.5-325 MG per tablet Take 1 tablet by mouth every 4 (four) hours as needed for pain. 03/07/14   StReynold BowenMD  OXYGEN Inhale 2.5 L into the lungs at bedtime. Only use oxygen at night    Historical Provider, MD  polyethylene glycol (MIRALAX / GLYCOLAX) packet Take 17 g by mouth daily.      Historical Provider, MD  predniSONE (DELTASONE) 5 MG tablet Take 5 mg by mouth daily with breakfast.    Historical Provider, MD  sennosides-docusate sodium (SENOKOT-S) 8.6-50 MG tablet Take 2 tablets by mouth daily.    Historical Provider, MD  valsartan (DIOVAN) 80 MG tablet Take 80 mg by mouth daily. 04/11/14   Historical Provider, MD   BP 142/56 mmHg  Pulse 61  Temp(Src) 97.6 F (36.4 C) (Oral)  Ht '5\' 7"'$  (1.702 m)  Wt 220 lb (99.791 kg)  BMI 34.45 kg/m2  SpO2 94%   Physical Exam  Constitutional: He is oriented to person, place, and time. He appears well-developed and well-nourished.  HENT:  Head: Normocephalic and atraumatic.  Mouth/Throat: Oropharynx is clear and moist.  Eyes: Conjunctivae and EOM are normal. Pupils are equal, round, and reactive to light.  Neck: Normal range of motion.  Cardiovascular: Normal rate, regular rhythm and normal heart sounds.   Pulmonary/Chest: Effort normal and breath sounds normal. No respiratory distress. He has no wheezes.  Abdominal: Soft. Bowel sounds are  normal. There is tenderness. There is CVA tenderness (left). There is no guarding.    Musculoskeletal: Normal range of motion.  Neurological: He is alert and oriented to person, place, and time.  Skin: Skin is warm and dry.  Psychiatric: He has a normal mood and affect.  Nursing note and vitals reviewed.   ED Course  Procedures (including critical care time) Labs Review Labs Reviewed  BASIC METABOLIC PANEL - Abnormal; Notable for the following:    Glucose, Bld 115 (*)    BUN 21 (*)    All other components within normal limits  URINALYSIS, ROUTINE W REFLEX MICROSCOPIC (NOT AT [facility]- Abnormal;  Notable for the following:    APPearance CLOUDY (*)    Hgb urine dipstick LARGE (*)    Protein, ur 30 (*)    Leukocytes, UA TRACE (*)    All other components within normal limits  CBC WITH DIFFERENTIAL/PLATELET  URINE MICROSCOPIC-ADD ON    Imaging Review Ct Renal Stone Study  06/30/2014   CLINICAL DATA:  Left flank pain starting 5 a.m. this morning  EXAM: CT ABDOMEN AND PELVIS WITHOUT CONTRAST  TECHNIQUE: Multidetector CT imaging of the abdomen and pelvis was performed following the standard protocol without IV contrast.  COMPARISON:  CT scan 10/21/2006  FINDINGS: Lung bases shows small bilateral basilar scarring. Sagittal images of the spine shows osteopenia and degenerative changes lumbar spine. Extensive atherosclerotic calcifications of abdominal aorta and bilateral common iliac arteries. 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  Small calcified gallstones are noted within gallbladder the largest measures 5 mm.  Again noted bilateral multiple renal cysts. There are 2 adjacent cyst in upper pole of the right kidney the largest measures 7.1 cm. Largest cyst in left kidney midpole laterally measures 5.5 cm. Nonobstructive calcification probable vascular in nature midpole of the right kidney measures 1 mm. There is probable vascular calcification in midpole of  the left kidney. Mild left hydronephrosis and proximal left hydroureter. Axial image 38 there is calcified obstructive calculus in proximal left ureter measures 3.6 mm at the level of lower endplate of L3 vertebral body. Bilateral distal ureter is unremarkable. No calcified calculi are noted within urinary bladder.  Mild left perinephric stranding.  Abundant stool noted in right colon and cecum. There is no pericecal inflammation. Normal appendix is noted in the right anterior pelvis medially axial image 46.  No small bowel obstruction. Colonic diverticula are noted descending colon. No evidence of acute diverticulitis. Multiple sigmoid colon diverticula. No evidence of acute diverticulitis. Prostate gland and seminal vesicles are unremarkable. There is small left inguinal canal hernia containing fat measures 3.2 cm without evidence of acute complication.  IMPRESSION: 1. Again noted bilateral renal cysts. Probable vascular renal calcifications bilaterally. 2. There is mild left hydronephrosis and proximal left hydroureter. Mild left perinephric stranding. 3. There is 3.6 mm calcified obstructive calculus in proximal left ureter at the level of lower endplate of L3 vertebral body. 4. Small calcified gallstones are noted within gallbladder. 5. No pericecal inflammation.  Normal appendix. 6. Colonic diverticula are noted descending colon. Multiple sigmoid colon diverticula. No evidence of acute diverticulitis. 7. No calcified calculi are noted within urinary bladder.   Electronically Signed   By: Lahoma Crocker M.D.   On: 06/30/2014 11:09     EKG Interpretation None      MDM   Final diagnoses:  Left flank pain  Ureteral calculus  Hematuria   81 year old male with history of kidney stones here with sudden onset left flank and abdominal pain. He is afebrile, nontoxic. He has left CVA tenderness with radiation to left lower abdomen. He denies any testicle pain. His urine sample present at bedside is grossly  bloody, states he first noticed that just now. Clinical suspicion for kidney stone. Will obtain labs, UA, and send for CT renal study.  Labwork is overall reassuring. UA with large blood, noninfectious. CT renal study revealing 2.6 mm calculus in the proximal left ureter. This is likely the source of his pain. Patient was given morphine and Zofran here with improvement. He is on percocet Q12H but may need it more frequently  for the next few days. Will also start on short course of flomax and zofran.  Patient to FU with his urologist, Dr. Karsten Ro.  Discussed plan with patient, he/she acknowledged understanding and agreed with plan of care.  Return precautions given for new or worsening symptoms.  Case discussed with attending physician, Dr. Regenia Skeeter, who evaluated patient and agrees with assessment and plan of care.  Larene Pickett, PA-C 06/30/14 1319  Sherwood Gambler, MD 07/02/14 [phone]

## 2014-08-09 ENCOUNTER — Emergency Department (HOSPITAL_COMMUNITY)
Admission: EM | Admit: 2014-08-09 | Discharge: 2014-08-10 | Disposition: A | Discharge status: Home / Self Care | Payer: Medicare Other | Attending: Emergency Medicine | Admitting: Emergency Medicine

## 2014-08-09 ENCOUNTER — Encounter (HOSPITAL_COMMUNITY): Payer: Self-pay | Admitting: Emergency Medicine

## 2014-08-09 DIAGNOSIS — R339 Retention of urine, unspecified: Secondary | ICD-10-CM | POA: Diagnosis present

## 2014-08-09 DIAGNOSIS — F419 Anxiety disorder, unspecified: Secondary | ICD-10-CM | POA: Insufficient documentation

## 2014-08-09 DIAGNOSIS — E860 Dehydration: Secondary | ICD-10-CM | POA: Diagnosis not present

## 2014-08-09 DIAGNOSIS — Z7951 Long term (current) use of inhaled steroids: Secondary | ICD-10-CM | POA: Insufficient documentation

## 2014-08-09 DIAGNOSIS — Z8551 Personal history of malignant neoplasm of bladder: Secondary | ICD-10-CM | POA: Diagnosis not present

## 2014-08-09 DIAGNOSIS — E039 Hypothyroidism, unspecified: Secondary | ICD-10-CM | POA: Diagnosis not present

## 2014-08-09 DIAGNOSIS — J449 Chronic obstructive pulmonary disease, unspecified: Secondary | ICD-10-CM | POA: Insufficient documentation

## 2014-08-09 DIAGNOSIS — I4891 Unspecified atrial fibrillation: Secondary | ICD-10-CM | POA: Insufficient documentation

## 2014-08-09 DIAGNOSIS — Z7982 Long term (current) use of aspirin: Secondary | ICD-10-CM | POA: Insufficient documentation

## 2014-08-09 DIAGNOSIS — I10 Essential (primary) hypertension: Secondary | ICD-10-CM | POA: Insufficient documentation

## 2014-08-09 DIAGNOSIS — Z79899 Other long term (current) drug therapy: Secondary | ICD-10-CM | POA: Diagnosis not present

## 2014-08-09 DIAGNOSIS — Z87891 Personal history of nicotine dependence: Secondary | ICD-10-CM | POA: Insufficient documentation

## 2014-08-09 DIAGNOSIS — E785 Hyperlipidemia, unspecified: Secondary | ICD-10-CM | POA: Insufficient documentation

## 2014-08-09 DIAGNOSIS — H409 Unspecified glaucoma: Secondary | ICD-10-CM | POA: Diagnosis not present

## 2014-08-09 DIAGNOSIS — Z880 Allergy status to penicillin: Secondary | ICD-10-CM | POA: Diagnosis not present

## 2014-08-09 LAB — URINALYSIS, ROUTINE W REFLEX MICROSCOPIC
Bilirubin Urine: NEGATIVE
GLUCOSE, UA: NEGATIVE mg/dL
HGB URINE DIPSTICK: NEGATIVE
Ketones, ur: NEGATIVE mg/dL
LEUKOCYTES UA: NEGATIVE
NITRITE: NEGATIVE
Protein, ur: NEGATIVE mg/dL
SPECIFIC GRAVITY, URINE: 1.017 (ref 1.005–1.030)
UROBILINOGEN UA: 0.2 mg/dL (ref 0.0–1.0)
pH: 5 (ref 5.0–8.0)

## 2014-08-09 LAB — CBC
HEMATOCRIT: 39.3 % (ref 39.0–52.0)
Hemoglobin: 12.5 g/dL — ABNORMAL LOW (ref 13.0–17.0)
MCH: 29.8 pg (ref 26.0–34.0)
MCHC: 31.8 g/dL (ref 30.0–36.0)
MCV: 93.8 fL (ref 78.0–100.0)
Platelets: 214 10*3/uL (ref 150–400)
RBC: 4.19 MIL/uL — ABNORMAL LOW (ref 4.22–5.81)
RDW: 13.9 % (ref 11.5–15.5)
WBC: 11.9 10*3/uL — AB (ref 4.0–10.5)

## 2014-08-09 LAB — BASIC METABOLIC PANEL
ANION GAP: 9 (ref 5–15)
BUN: 17 mg/dL (ref 6–20)
CHLORIDE: 102 mmol/L (ref 101–111)
CO2: 26 mmol/L (ref 22–32)
CREATININE: 0.85 mg/dL (ref 0.61–1.24)
Calcium: 9.3 mg/dL (ref 8.9–10.3)
GFR calc non Af Amer: >60 mL/min (ref >60)
Glucose, Bld: 134 mg/dL — ABNORMAL HIGH (ref 65–99)
POTASSIUM: 4.3 mmol/L (ref 3.5–5.1)
SODIUM: 137 mmol/L (ref 135–145)

## 2014-08-09 NOTE — ED Notes (Signed)
Pt. unable to urinate, urinary urgency with small amount concentrated urine this morning , denies fever or chills.

## 2014-08-10 NOTE — ED Provider Notes (Signed)
CSN: [id number]     Arrival date & time 08/09/14  2150 History   First MD Initiated Contact with Patient 08/09/14 2224     Chief Complaint  Patient presents with  . Urinary Retention     (Consider location/radiation/quality/duration/timing/severity/associated sxs/prior Treatment) HPI Comments: Patient had diarrhea yesterday. Given Imodium by his daughter. He had decreased urine output today. He has a urinal home because he's preparing for knee surgery and said he had very little amount in the urinal. He denies any pain with pain, hematuria, back pain, fever.  Patient is a 81 y.o. male presenting with general illness. The history is provided by the patient.  Illness Location:  Diffuse Quality:  Decreased amount of urine Severity:  Mild Onset quality:  Gradual Duration:  1 day Timing:  Constant Progression:  Unchanged Chronicity:  New Context:  Had diarrhea yesterday, took Imodium for this. Today he drank a quart of water and had very minimal urine output. No history of urinary retention. No back pain, fevers, abdominal pain, vomiting, diarrhea. Relieved by:  Nothing Worsened by:  Nothing Associated symptoms: no abdominal pain, no chest pain, no cough, no fever, no shortness of breath and no vomiting     Past Medical History  Diagnosis Date  . Hypertension   . Hyperlipidemia   . Hypothyroidism   . COPD (chronic obstructive pulmonary disease)   . Atrial fibrillation   . Glaucoma   . Anxiety   . History of bladder cancer   . Lung nodule     SMALL  . OSA (obstructive sleep apnea)    Past Surgical History  Procedure Laterality Date  . Transurethral resection of prostate    . Cardiovascular stress test  12/25/2007    EF 64%. NO EVIDENCE OF ISCHEMIA  . Back surgery  10-18-11  . Cystoscopy N/A 12/15   Family History  Problem Relation Age of Onset  . Lung cancer      was never a smoker   History  Substance Use Topics  . Smoking status: Former Smoker -- 1.50 packs/day for  60 years    Types: Cigarettes    Quit date: 11/27/2010  . Smokeless tobacco: Never Used  . Alcohol Use: No    Review of Systems  Constitutional: Negative for fever.  Respiratory: Negative for cough and shortness of breath.   Cardiovascular: Negative for chest pain and leg swelling.  Gastrointestinal: Negative for vomiting and abdominal pain.  All other systems reviewed and are negative.     Allergies  Amoxicillin; Motrin; and Chantix  Home Medications   Prior to Admission medications   Medication Sig Start Date End Date Taking? Authorizing Provider  acetaminophen (TYLENOL) 500 MG tablet Take 1,000 mg by mouth daily as needed (take inbetween Percocet dose).     Historical Provider, MD  albuterol (PROVENTIL HFA;VENTOLIN HFA) 108 (90 BASE) MCG/ACT inhaler Inhale 2 puffs into the lungs every 4 (four) hours as needed for wheezing or shortness of breath.    Historical Provider, MD  albuterol (PROVENTIL) (2.5 MG/3ML) 0.083% nebulizer solution Take 2.5 mg by nebulization every 6 (six) hours as needed for shortness of breath.  04/25/14   Historical Provider, MD  alendronate (FOSAMAX) 70 MG tablet Take 70 mg by mouth once a week. Monday 06/08/14   Historical Provider, MD  ALPRAZolam Duanne Moron) 0.5 MG tablet Take 0.25-0.5 mg by mouth at bedtime as needed for anxiety.  05/23/14   Historical Provider, MD  aspirin 81 MG tablet Take 81 mg by mouth  daily.      Historical Provider, MD  atorvastatin (LIPITOR) 10 MG tablet Take 10 mg by mouth daily at 6 PM.     Historical Provider, MD  Calcium Carbonate-Vitamin D (CALCIUM + D PO) Take 2 tablets by mouth daily.    Historical Provider, MD  diltiazem (CARDIZEM CD) 180 MG 24 hr capsule Take 180 mg by mouth daily.    Historical Provider, MD  DULoxetine (CYMBALTA) 30 MG capsule Take 30 mg by mouth every evening.     Historical Provider, MD  fluticasone (FLONASE) 50 MCG/ACT nasal spray Place 2 sprays into the nose daily.      Historical Provider, MD  latanoprost  (XALATAN) 0.005 % ophthalmic solution Place 1 drop into both eyes every evening.     Historical Provider, MD  levothyroxine (SYNTHROID, LEVOTHROID) 100 MCG tablet Take 50-100 mcg by mouth daily before breakfast. Takes 34mg on sun and mon Takes 1069m all other days    Historical Provider, MD  metFORMIN (GLUCOPHAGE) 500 MG tablet Take 500 mg by mouth 2 (two) times daily with a meal.     Historical Provider, MD  ondansetron (ZOFRAN ODT) 4 MG disintegrating tablet Take 1 tablet (4 mg total) by mouth every 8 (eight) hours as needed for nausea. 06/30/14   LiLarene PickettPA-C  oxyCODONE-acetaminophen (PERCOCET/ROXICET) 5-325 MG per tablet Take 1 tablet by mouth every 4 (four) hours as needed. 06/30/14   LiLarene PickettPA-C  OXYGEN Inhale 2.5 L into the lungs at bedtime. Only use oxygen at night    Historical Provider, MD  polyethylene glycol (MIRALAX / GLYCOLAX) packet Take 17 g by mouth daily.      Historical Provider, MD  tamsulosin (FLOMAX) 0.4 MG CAPS capsule Take 1 capsule (0.4 mg total) by mouth daily after supper. 06/30/14   LiLarene PickettPA-C   BP 110/43 mmHg  Pulse 73  Temp(Src) 98.2 F (36.8 C) (Oral)  Resp 16  SpO2 97% Physical Exam  Constitutional: He is oriented to person, place, and time. He appears well-developed and well-nourished. No distress.  HENT:  Head: Normocephalic and atraumatic.  Mouth/Throat: No oropharyngeal exudate.  Eyes: EOM are normal. Pupils are equal, round, and reactive to light.  Neck: Normal range of motion. Neck supple.  Cardiovascular: Normal rate and regular rhythm.  Exam reveals no friction rub.   No murmur heard. Pulmonary/Chest: Effort normal and breath sounds normal. No respiratory distress. He has no wheezes. He has no rales.  Abdominal: Soft. He exhibits no distension and no mass. There is no tenderness. There is no rebound.  Musculoskeletal: Normal range of motion. He exhibits no edema.  Neurological: He is alert and oriented to person, place, and  time.  Skin: He is not diaphoretic.  Nursing note and vitals reviewed.   ED Course  Procedures (including critical care time) Labs Review Labs Reviewed  CBC - Abnormal; Notable for the following:    WBC 11.9 (*)    RBC 4.19 (*)    Hemoglobin 12.5 (*)    All other components within normal limits  BASIC METABOLIC PANEL - Abnormal; Notable for the following:    Glucose, Bld 134 (*)    All other components within normal limits  URINALYSIS, ROUTINE W REFLEX MICROSCOPIC [facility]   Imaging Review No results found.   EKG Interpretation None      MDM   Final diagnoses:  Dehydration    [age]ear old male here with some concern for urinary retention.  Does have some possible dehydration yesterday with diarrhea. He is urinating here and we did not have to cath him for sample. He had 80 mL on the bedside bladder scan. Here his urine is normal, no signs of infection. He has no signs of dehydration on labs and creatinine is normal. I suspect he was dehydrated due to his diarrhea yesterday so he had decreased output. Without any lab abnormalities, I encouraged him to continue strong by mouth hydration and follow-up with his PCP in 2 days. He is urinating here and does not require Foley catheterization for acute urinary retention. He is well-appearing and stable for discharge    Evelina Bucy, MD 08/10/14 0020

## 2014-08-10 NOTE — Discharge Instructions (Signed)
Dehydration Dehydration is when you lose more fluids from the body than you take in. Vital organs such as the kidneys, brain, and heart cannot function without a proper amount of fluids and salt. Any loss of fluids from the body can cause dehydration.  Older adults are at a higher risk of dehydration than younger adults. As we age, our bodies are less able to conserve water and do not respond to temperature changes as well. Also, older adults do not become thirsty as easily or quickly. Because of this, older adults often do not realize they need to increase fluids to avoid dehydration.  CAUSES   Vomiting.  Diarrhea.  Excessive sweating.  Excessive urination.  Fever.  Certain medicines, such as blood pressure medicines called diuretics.  Poorly controlled blood sugars. SIGNS AND SYMPTOMS  Mild dehydration:  Thirst.  Dry lips.  Slightly dry mouth. Moderate dehydration:  Very dry mouth.  Sunken eyes.  Skin does not bounce back quickly when lightly pinched and released.  Dark urine and decreased urine production.  Decreased tear production.  Headache. Severe dehydration:  Very dry mouth.  Extreme thirst.  Rapid, weak pulse (more than 100 beats per minute at rest).  Cold hands and feet.  Not able to sweat in spite of heat.  Rapid breathing.  Blue lips.  Confusion and lethargy.  Difficulty being awakened.  Minimal urine production.  No tears. DIAGNOSIS  Your health care provider will diagnose dehydration based on your symptoms and your exam. Blood and urine tests will help confirm the diagnosis. The diagnostic evaluation should also identify the cause of dehydration. TREATMENT  Treatment of mild or moderate dehydration can often be done at home by increasing the amount of fluids that you drink. It is best to drink small amounts of fluid more often. Drinking too much at one time can make vomiting worse. Severe dehydration needs to be treated at the hospital.  You may be given IV fluids that contain water and electrolytes. HOME CARE INSTRUCTIONS   Ask your health care provider about specific rehydration instructions.  Drink enough fluids to keep your urine clear or pale yellow.  Drink small amounts frequently if you have nausea and vomiting.  Eat as you normally do.  Avoid:  Foods or drinks high in sugar.  Carbonated drinks.  Juice.  Extremely hot or cold fluids.  Drinks with caffeine.  Fatty, greasy foods.  Alcohol.  Tobacco.  Overeating.  Gelatin desserts.  Wash your hands well to avoid spreading bacteria and viruses.  Only take over-the-counter or prescription medicines for pain, discomfort, or fever as directed by your health care provider.  Ask your health care provider if you should continue all prescribed and over-the-counter medicines.  Keep all follow-up appointments with your health care provider. SEEK MEDICAL CARE IF:  You have abdominal pain, and it increases or stays in one area (localizes).  You have a rash, stiff neck, or severe headache.  You are irritable, sleepy, or difficult to awaken.  You are weak, dizzy, or extremely thirsty.  You have a fever. SEEK IMMEDIATE MEDICAL CARE IF:   You are unable to keep fluids down, or you get worse despite treatment.  You have frequent episodes of vomiting or diarrhea.  You have blood or green matter (bile) in your vomit.  You have blood in your stool, or your stool looks black and tarry.  You have not urinated in 6-8 hours, or you have only urinated a small amount of very dark urine.    You faint. MAKE SURE YOU:   Understand these instructions.  Will watch your condition.  Will get help right away if you are not doing well or get worse. Document Released: 04/13/2003 Document Revised: 01/26/2013 Document Reviewed: 09/28/2012 ExitCare Patient Information 2015 [employer]. This information is not intended to replace advice given to you by your  health care provider. Make sure you discuss any questions you have with your health care provider.  

## 2014-09-07 ENCOUNTER — Ambulatory Visit (INDEPENDENT_AMBULATORY_CARE_PROVIDER_SITE_OTHER): Payer: Medicare Other | Admitting: Cardiovascular Disease

## 2014-09-07 ENCOUNTER — Encounter: Payer: Self-pay | Admitting: Cardiovascular Disease

## 2014-09-07 VITALS — BP 110/58 | HR 70 | Ht 67.0 in | Wt 216.8 lb

## 2014-09-07 DIAGNOSIS — I480 Paroxysmal atrial fibrillation: Secondary | ICD-10-CM | POA: Diagnosis not present

## 2014-09-07 DIAGNOSIS — Z8679 Personal history of other diseases of the circulatory system: Secondary | ICD-10-CM

## 2014-09-07 DIAGNOSIS — I10 Essential (primary) hypertension: Secondary | ICD-10-CM | POA: Diagnosis not present

## 2014-09-07 DIAGNOSIS — E785 Hyperlipidemia, unspecified: Secondary | ICD-10-CM

## 2014-09-07 NOTE — Patient Instructions (Signed)
Medication Instructions:  Your physician recommends that you continue on your current medications as directed. Please refer to the Current Medication list given to you today.   Labwork: None Ordered   Testing/Procedures: None Ordered   Follow-Up: Your physician recommends that you schedule a follow-up appointment in: as needed with Dr. Nahser     

## 2014-09-07 NOTE — Progress Notes (Signed)
Taylor [name] Date of Birth  08-06-1928 [place] [address]    [place]   [place], [place]  [address]    [place], [place]  [address] [phone]  Fax  [phone]  [phone]  Fax [phone]   Problem List: 1.  COPD 2.  Spinal Stenosis 3.  Paroxysmal atrial fib     History of Present Illness:  Taylor [name] is an 81 yo gentleman who I have seen in the past for dyspnea.  He has severe COPD.  No episodes of chest pain.  He was hospitalized in September 2012 with respiratory failure and COPD exacerbation.  He has stopped smoking as of that hospitalization.  He has had a Adenosine Myoview that was unremarkable. He has a hx of atrial flutter  in the past.    Aug. 3, 2016:  Taylor [name] is seen today after a 3 year absence.  He has done well.  No CP or dyspnea.   I saw him several years ago for atrial flutter.  He has not had any episodes of tachypalpitations  Needs to have left knee replacement.  He had back surgery 3 years ago and did fine from a cardiac standpoint.   Had some complications from the surgery ( not related to cardiac issues. )    Current Outpatient Prescriptions on File Prior to Visit  Medication Sig Dispense Refill  . acetaminophen (TYLENOL) 500 MG tablet Take 1,000 mg by mouth daily as needed (take inbetween Percocet dose).     Marland Kitchen albuterol (PROVENTIL HFA;VENTOLIN HFA) 108 (90 BASE) MCG/ACT inhaler Inhale 2 puffs into the lungs every 4 (four) hours as needed for wheezing or shortness of breath.    Marland Kitchen albuterol (PROVENTIL) (2.5 MG/3ML) 0.083% nebulizer solution Take 2.5 mg by nebulization every 6 (six) hours as needed for shortness of breath.   12  . alendronate (FOSAMAX) 70 MG tablet Take 70 mg by mouth once a week. Monday  1  . ALPRAZolam (XANAX) 0.5 MG tablet Take 0.25-0.5 mg by mouth at bedtime as needed for anxiety.   2  . aspirin 81 MG tablet Take 81 mg by mouth daily.      . Calcium Carbonate-Vitamin D  (CALCIUM + D PO) Take 2 tablets by mouth daily.    Marland Kitchen diltiazem (CARDIZEM CD) 180 MG 24 hr capsule Take 180 mg by mouth daily.    . DULoxetine (CYMBALTA) 30 MG capsule Take 30 mg by mouth every evening.     . fluticasone (FLONASE) 50 MCG/ACT nasal spray Place 2 sprays into the nose daily.      Marland Kitchen latanoprost (XALATAN) 0.005 % ophthalmic solution Place 1 drop into both eyes every evening.     Marland Kitchen levothyroxine (SYNTHROID, LEVOTHROID) 100 MCG tablet Take 50-100 mcg by mouth daily before breakfast. Takes 56mg on sun and mon Takes 1010m all other days    . metFORMIN (GLUCOPHAGE) 500 MG tablet Take 500 mg by mouth 2 (two) times daily with a meal.     . OXYGEN Inhale 2.5 L into the lungs at bedtime. Only use oxygen at night    . polyethylene glycol (MIRALAX / GLYCOLAX) packet Take 17 g by mouth daily.       No current facility-administered medications on file prior to visit.    Allergies  Allergen Reactions  . Amoxicillin Palpitations  . Motrin [Ibuprofen] Palpitations  . Chantix [Varenicline Tartrate] Other (See Comments)    depression  Past Medical History  Diagnosis Date  . Hypertension   . Hyperlipidemia   . Hypothyroidism   . COPD (chronic obstructive pulmonary disease)   . Atrial fibrillation   . Glaucoma   . Anxiety   . History of bladder cancer   . Lung nodule     SMALL  . OSA (obstructive sleep apnea)     Past Surgical History  Procedure Laterality Date  . Transurethral resection of prostate    . Cardiovascular stress test  12/25/2007    EF 64%. NO EVIDENCE OF ISCHEMIA  . Back surgery  10-18-11  . Cystoscopy N/A 12/15    History  Smoking status  . Former Smoker -- 1.50 packs/day for 60 years  . Types: Cigarettes  . Quit date: 11/27/2010  Smokeless tobacco  . Never Used    History  Alcohol Use No    Family History  Problem Relation Age of Onset  . Cancer - Lung Mother     36  . Heart attack Mother   . Heart Problems Father   . Diabetes Brother   .  Alcoholism Brother   . Other Daughter     [place]  . Other Other     [place] of Systems:  Reviewed in the HPI.  All other systems are negative.  Physical Exam: Blood pressure 110/58, pulse 70, height '5\' 7"'$  (1.702 m), weight 98.34 kg (216 lb 12.8 oz). General: Well developed, well nourished, in no acute distress.  Head: He's had extensive facial surgery. He's had part of his nose removed and the tissue graft from his forehead.   sclera non-icteric, mucus membranes are moist,   Neck: Supple. Negative for carotid bruits. JVD not elevated.  Lungs: Clear bilaterally to auscultation without wheezes, rales, or rhonchi. Breathing is unlabored.  Heart: RRR with S1 S2. No murmurs, rubs, or gallops appreciated.  Abdomen: Soft, non-tender, non-distended with normoactive bowel sounds. No hepatomegaly. No rebound/guarding. No obvious abdominal masses.  Msk:  Strength and tone appear normal for age.  Extremities: No clubbing or cyanosis. No edema.  Distal pedal pulses are 2+ and equal bilaterally.  Neuro: Alert and oriented X 3. Moves all extremities spontaneously.  Psych:  Responds to questions appropriately with a normal affect.  ECG: Aug. 3, 2016:  NSR at 70, no ST or T wave changes.   Assessment / Plan:   1.  COPD 2.  Spinal Stenosis 3.  Paroxysmal atrial fib-   He has done well.  No further episodes of atrial fib .  He should be at low risk for cardiac complications during this knee surgery. Will see him on an as needed basis.     Nahser, Wonda Cheng, MD  09/07/2014 1:14 PM    [facility] [address],  [place], [place]  [address] Pager [phone] Phone: [phone]; Fax: [phone]   [facility]  [address] [place],   [address] [phone]   Fax [phone]

## 2014-09-09 ENCOUNTER — Encounter: Payer: Medicare Other | Admitting: Internal Medicine

## 2014-09-16 NOTE — H&P (Signed)
TOTAL KNEE ADMISSION H&P  Patient is being admitted for left total knee arthroplasty.  Subjective:  Chief Complaint:left knee pain.  HPI: Taylor [name], 81 y.o. male, has a history of pain and functional disability in the left knee due to trauma and has failed non-surgical conservative treatments for greater than 12 weeks to includeNSAID's and/or analgesics, corticosteriod injections, supervised PT with diminished ADL's post treatment, use of assistive devices and activity modification.  Onset of symptoms was abrupt, starting 1 years ago with gradually worsening course since that time. The patient noted no past surgery on the left knee(s).  Patient currently rates pain in the left knee(s) at 10 out of 10 with activity. Patient has night pain, worsening of pain with activity and weight bearing, pain that interferes with activities of daily living and pain with passive range of motion.  Patient has evidence of insufficiency fracture of the left knee by imaging studies. There is no active infection.  Patient Active Problem List   Diagnosis Date Noted  . Low back pain radiating to left leg 03/02/2014    Class: Acute  . Back pain 03/02/2014  . COPD (chronic obstructive pulmonary disease) 08/01/2011  . COPD with acute exacerbation 04/12/2011  . Hypoxemia 04/09/2011  . Type 2 diabetes mellitus 01/14/2011  . Hypothyroidism 01/14/2011  . Hypertension 01/14/2011  . History of atrial flutter 01/14/2011  . Hyperlipidemia 01/14/2011  . History of depression 01/14/2011  . Vitamin D deficiency 01/14/2011  . History of bladder cancer 01/14/2011  . OSA (obstructive sleep apnea) 01/14/2011  . Peripheral edema 01/14/2011   Past Medical History  Diagnosis Date  . Hypertension   . Hyperlipidemia   . Hypothyroidism   . COPD (chronic obstructive pulmonary disease)   . Atrial fibrillation   . Glaucoma   . Anxiety   . History of bladder cancer   . Lung nodule     SMALL  . OSA (obstructive sleep  apnea)     Past Surgical History  Procedure Laterality Date  . Transurethral resection of prostate    . Cardiovascular stress test  12/25/2007    EF 64%. NO EVIDENCE OF ISCHEMIA  . Back surgery  10-18-11  . Cystoscopy N/A 12/15    No prescriptions prior to admission   Allergies  Allergen Reactions  . Amoxicillin Palpitations  . Motrin [Ibuprofen] Palpitations  . Chantix [Varenicline Tartrate] Other (See Comments)    depression    Social History  Substance Use Topics  . Smoking status: Former Smoker -- 1.50 packs/day for 60 years    Types: Cigarettes    Quit date: 11/27/2010  . Smokeless tobacco: Never Used  . Alcohol Use: No    Family History  Problem Relation Age of Onset  . Cancer - Lung Mother     72  . Heart attack Mother   . Heart Problems Father   . Diabetes Brother   . Alcoholism Brother   . Other Daughter     [place]  . Other Other     HALF SISTER [facility]     Review of Systems  Constitutional: Negative for fever and chills.  HENT: Negative for congestion and sore throat.   Eyes: Negative for blurred vision.  Respiratory: Positive for shortness of breath (with ambulation and uses O2 at home at night). Negative for wheezing.   Cardiovascular: Negative for chest pain and palpitations.  Gastrointestinal: Negative for nausea, vomiting and abdominal pain.  Musculoskeletal:       L knee  pain with ROM and ambulation  Skin: Negative for rash.  Neurological: Negative for dizziness and seizures.  Psychiatric/Behavioral: Negative for depression and suicidal ideas.    Objective:  Physical Exam  Constitutional: He is oriented to person, place, and time. He appears well-developed and well-nourished.  HENT:  Head: Normocephalic and atraumatic.  Eyes: Conjunctivae and EOM are normal. Pupils are equal, round, and reactive to light.  Neck: Normal range of motion. Neck supple.  Cardiovascular: Normal rate and intact distal pulses.   Respiratory: Effort  normal and breath sounds normal.  GI: Soft. Bowel sounds are normal.  Musculoskeletal: He exhibits tenderness (L knee).  Decreased ROM due to pain  Neurological: He is alert and oriented to person, place, and time.  Skin: Skin is warm and dry.  Psychiatric: He has a normal mood and affect. His behavior is normal. Judgment and thought content normal.    Vital signs in last 24 hours:    Labs:   Estimated body mass index is 33.95 kg/(m^2) as calculated from the following:   Height as of 06/30/14: '5\' 7"'$  (1.702 m).   Weight as of 09/07/14: 98.34 kg (216 lb 12.8 oz).   Imaging Review Plain radiographs demonstrate moderate degenerative joint disease of the left knee(s). The overall alignment is intact but insufficiency fracture of the L knee is noted. The bone quality appears to be fair for age and reported activity level.  Assessment/Plan:  Insufficiency fracture of left knee   The patient history, physical examination, clinical judgment of the provider and imaging studies are consistent with insufficiency fracture of the left knee(s) and total knee arthroplasty is deemed medically necessary. The treatment options including medical management, injection therapy arthroscopy and arthroplasty were discussed at length. The risks and benefits of total knee arthroplasty were presented and reviewed. The risks due to aseptic loosening, infection, stiffness, patella tracking problems, thromboembolic complications and other imponderables were discussed. The patient acknowledged the explanation, agreed to proceed with the plan and consent was signed. Patient is being admitted for inpatient treatment for surgery, pain control, PT, OT, prophylactic antibiotics, VTE prophylaxis, progressive ambulation and ADL's and discharge planning. The patient is planning to be discharged to skilled nursing facility

## 2014-09-29 ENCOUNTER — Encounter (HOSPITAL_COMMUNITY): Payer: Self-pay

## 2014-09-29 ENCOUNTER — Encounter (HOSPITAL_COMMUNITY)
Admission: RE | Admit: 2014-09-29 | Discharge: 2014-09-29 | Disposition: A | Discharge status: Home / Self Care | Payer: Medicare Other | Source: Ambulatory Visit | Attending: Orthopedic Surgery | Admitting: Orthopedic Surgery

## 2014-09-29 DIAGNOSIS — M179 Osteoarthritis of knee, unspecified: Secondary | ICD-10-CM | POA: Diagnosis not present

## 2014-09-29 DIAGNOSIS — Z01812 Encounter for preprocedural laboratory examination: Secondary | ICD-10-CM | POA: Diagnosis present

## 2014-09-29 DIAGNOSIS — Z0183 Encounter for blood typing: Secondary | ICD-10-CM | POA: Insufficient documentation

## 2014-09-29 HISTORY — DX: Acute embolism and thrombosis of unspecified deep veins of left lower extremity: I82.402

## 2014-09-29 HISTORY — DX: Type 2 diabetes mellitus without complications: E11.9

## 2014-09-29 HISTORY — DX: Age-related osteoporosis without current pathological fracture: M81.0

## 2014-09-29 HISTORY — DX: Malignant (primary) neoplasm, unspecified: C80.1

## 2014-09-29 HISTORY — DX: Unspecified osteoarthritis, unspecified site: M19.90

## 2014-09-29 HISTORY — DX: Major depressive disorder, single episode, unspecified: F32.9

## 2014-09-29 HISTORY — DX: Cardiac arrhythmia, unspecified: I49.9

## 2014-09-29 HISTORY — DX: Dependence on supplemental oxygen: Z99.81

## 2014-09-29 HISTORY — DX: Calculus of kidney: N20.0

## 2014-09-29 HISTORY — DX: Cardiac murmur, unspecified: R01.1

## 2014-09-29 HISTORY — DX: Depression, unspecified: F32.A

## 2014-09-29 LAB — BASIC METABOLIC PANEL
Anion gap: 7 (ref 5–15)
BUN: 15 mg/dL (ref 6–20)
CO2: 30 mmol/L (ref 22–32)
Calcium: 9.3 mg/dL (ref 8.9–10.3)
Chloride: 100 mmol/L — ABNORMAL LOW (ref 101–111)
Creatinine, Ser: 0.74 mg/dL (ref 0.61–1.24)
GFR calc Af Amer: >60 mL/min (ref >60)
GFR calc non Af Amer: >60 mL/min (ref >60)
GLUCOSE: 110 mg/dL — AB (ref 65–99)
POTASSIUM: 5.2 mmol/L — AB (ref 3.5–5.1)
Sodium: 137 mmol/L (ref 135–145)

## 2014-09-29 LAB — CBC
HEMATOCRIT: 38.6 % — AB (ref 39.0–52.0)
Hemoglobin: 12.2 g/dL — ABNORMAL LOW (ref 13.0–17.0)
MCH: 30.4 pg (ref 26.0–34.0)
MCHC: 31.6 g/dL (ref 30.0–36.0)
MCV: 96.3 fL (ref 78.0–100.0)
PLATELETS: 203 10*3/uL (ref 150–400)
RBC: 4.01 MIL/uL — AB (ref 4.22–5.81)
RDW: 13.8 % (ref 11.5–15.5)
WBC: 9.4 10*3/uL (ref 4.0–10.5)

## 2014-09-29 LAB — SURGICAL PCR SCREEN
MRSA, PCR: POSITIVE — AB
Staphylococcus aureus: POSITIVE — AB

## 2014-09-29 LAB — GLUCOSE, CAPILLARY: Glucose-Capillary: 123 mg/dL — ABNORMAL HIGH (ref 65–99)

## 2014-09-29 LAB — TYPE AND SCREEN
ABO/RH(D): A POS
Antibody Screen: NEGATIVE

## 2014-09-29 LAB — ABO/RH: ABO/RH(D): A POS

## 2014-09-29 NOTE — Progress Notes (Signed)
Patient has CARDIAC and PULMONOLOGY clearance under MEDIA tab in EPIC.    Patient also has medical clearance from Dr. Reynold Bowen; have requested copy from [facility].    Patient denies seeing Dr. Acie Fredrickson in the last 3 years except for cardiac clearance for surgeries.    EKG: 09/07/14 EPIC  ECHO: 2013 EPIC  Stress test: 2009 EPIC  Cardiac Catherization: Denies

## 2014-09-29 NOTE — Progress Notes (Signed)
   09/29/14 1232  OBSTRUCTIVE SLEEP APNEA  Have you ever been diagnosed with sleep apnea through a sleep study? No (pt has had sleep study done in the past; was negative)  Do you snore loudly (loud enough to be heard through closed doors)?  0  Do you often feel tired, fatigued, or sleepy during the daytime? 0  Has anyone observed you stop breathing during your sleep? 0  Do you have, or are you being treated for high blood pressure? 1  BMI more than 35 kg/m2? 0  Age over 39 years old? 1  Neck circumference greater than 40 cm/16 inches? 1 (17)  Gender: 1

## 2014-09-29 NOTE — Pre-Procedure Instructions (Signed)
LEIGH KAEDING  09/29/2014      [id number] [place] [facility] [id number]  [id number]: [phone]: [phone]   Your procedure is scheduled on Tuesday September 6th 2016.  [facility]  Call this number if you have problems the morning of surgery:  [phone]  Call this number if you have problems in the days leading up to your surgery:  [phone]    Remember:  Do not eat food or drink liquids after midnight on Monday Sept. 5th 2016.  Take these medicines the morning of surgery with A SIP OF WATER Fosamax (alendronate), Atorvastatin, Diltiazem (Cardizem), Levothyroxine (synthroid, levothroid) IF NEEDED: Oxycodone, Percocet, Alprazolam (xanax)  STOP: ALL Vitamins, Supplements, Effient and Herbal Medications, Fish Oils, Aspirins, NSAIDs (Nonsteroidal Anti-inflammatories such as Ibuprofen, Aleve, or Advil), and Goody's/BC Powders 7 days prior to surgery, until after surgery as directed by your physician. This includes: Aspirin, Calcium/Vitamin D.  DO NOT take Metformin the morning of surgery.   How to Manage Your Diabetes Before Surgery   Why is it important to control my blood sugar before and after surgery?   Improving blood sugar levels before and after surgery helps healing and can limit problems.  A way of improving blood sugar control is eating a healthy diet by:  - Eating less sugar and carbohydrates  - Increasing activity/exercise  - Talk with your doctor about reaching your blood sugar goals  High blood sugars (greater than 180 mg/dL) can raise your risk of infections and slow down your recovery so you will need to focus on controlling your diabetes during the weeks before surgery.  Make sure that the doctor who takes care of your diabetes knows about your planned surgery including the date and location.  How do I manage my blood sugars before  surgery?   Check your blood sugar at least 4 times a day, 2 days before surgery to make sure that they are not too high or low.   Check your blood sugar the morning of your surgery when you wake up and every 2               hours until you get to the Short-Stay unit.  If your blood sugar is less than 70 mg/dL, you will need to treat for low blood sugar by:  Treat a low blood sugar (less than 70 mg/dL) with 1/2 cup of clear juice (cranberry or apple), 4 glucose tablets, OR glucose gel.  Recheck blood sugar in 15 minutes after treatment (to make sure it is greater than 70 mg/dL).  If blood sugar is not greater than 70 mg/dL on re-check, call [phone] further instructions.   Report your blood sugar to the Short-Stay nurse when you get to Short-Stay.  References:  [facility] 2007 "How to Manage your Diabetes Before and After Surgery".  What do I do about my diabetes medications?   Do not take oral diabetes medicines (pills) the morning of surgery. (METFORMIN)             Do not wear jewelry, make-up or nail polish.  Do not wear lotions, powders, or perfumes.  You may wear deodorant.  Do not shave 48 hours prior to surgery.  Men may shave face and neck.  Do not bring valuables to the hospital.  [facility] or [facility].  Contacts, dentures or bridgework may not be worn into surgery.  Leave your suitcase in the car.  After surgery it may be brought to your room.  For patients admitted to the hospital, discharge time will be determined by your treatment team.  Patients discharged the day of surgery will not be allowed to drive home.   Special instructions:  Please follow these instructions carefully:  1. Shower with CHG Soap the night before surgery and the morning of Surgery. 2. If you choose to wash your hair, wash your hair first as usual with your normal  shampoo. 3. After you shampoo, rinse your hair and body thoroughly to remove the Shampoo. 4. Use CHG as you would any other liquid soap. You can apply chg directly to the skin and wash gently with scrungie or a clean washcloth. 5. Apply the CHG Soap to your body ONLY FROM THE NECK DOWN. Do not use on open wounds or open sores. Avoid contact with your eyes, ears, mouth and genitals (private parts). Wash genitals (private parts) with your normal soap. 6. Wash thoroughly, paying special attention to the area where your surgery will be performed. 7. Thoroughly rinse your body with warm water from the neck down. 8. DO NOT shower/wash with your normal soap after using and rinsing off the CHG Soap. 9. Pat yourself dry with a clean towel.  10. Wear clean pajamas.  11. Place clean sheets on your bed the night of your first shower and do not sleep with pets.  Day of Surgery  Do not apply any lotions/deodorants the morning of surgery. Please wear clean clothes to the hospital/surgery center.    Please read over the following fact sheets that you were given. Pain Booklet, Coughing and Deep Breathing, Blood Transfusion Information, MRSA Information and Surgical Site Infection Prevention

## 2014-09-30 LAB — HEMOGLOBIN A1C
Hgb A1c MFr Bld: 6.1 % — ABNORMAL HIGH (ref 4.8–5.6)
Mean Plasma Glucose: 128 mg/dL

## 2014-10-11 ENCOUNTER — Inpatient Hospital Stay (HOSPITAL_COMMUNITY): Payer: Medicare Other | Admitting: Anesthesiology

## 2014-10-11 ENCOUNTER — Inpatient Hospital Stay (HOSPITAL_COMMUNITY): Payer: Medicare Other | Admitting: Emergency Medicine

## 2014-10-11 ENCOUNTER — Encounter (HOSPITAL_COMMUNITY)
Admission: RE | Disposition: A | Discharge status: Skilled Nursing Facility | Payer: Self-pay | Source: Ambulatory Visit | Attending: Orthopedic Surgery

## 2014-10-11 ENCOUNTER — Inpatient Hospital Stay (HOSPITAL_COMMUNITY)
Admission: RE | Admit: 2014-10-11 | Discharge: 2014-10-13 | DRG: 470 | Disposition: A | Discharge status: Skilled Nursing Facility | Payer: Medicare Other | Source: Ambulatory Visit | Attending: Orthopedic Surgery | Admitting: Orthopedic Surgery

## 2014-10-11 ENCOUNTER — Inpatient Hospital Stay (HOSPITAL_COMMUNITY): Payer: Medicare Other

## 2014-10-11 ENCOUNTER — Encounter (HOSPITAL_COMMUNITY): Payer: Self-pay | Admitting: Anesthesiology

## 2014-10-11 DIAGNOSIS — E785 Hyperlipidemia, unspecified: Secondary | ICD-10-CM | POA: Diagnosis present

## 2014-10-11 DIAGNOSIS — Z7983 Long term (current) use of bisphosphonates: Secondary | ICD-10-CM

## 2014-10-11 DIAGNOSIS — Z6833 Body mass index (BMI) 33.0-33.9, adult: Secondary | ICD-10-CM | POA: Diagnosis not present

## 2014-10-11 DIAGNOSIS — I739 Peripheral vascular disease, unspecified: Secondary | ICD-10-CM | POA: Diagnosis present

## 2014-10-11 DIAGNOSIS — M25562 Pain in left knee: Secondary | ICD-10-CM | POA: Diagnosis present

## 2014-10-11 DIAGNOSIS — M1712 Unilateral primary osteoarthritis, left knee: Secondary | ICD-10-CM | POA: Diagnosis present

## 2014-10-11 DIAGNOSIS — M545 Low back pain: Secondary | ICD-10-CM | POA: Diagnosis present

## 2014-10-11 DIAGNOSIS — M8440XA Pathological fracture, unspecified site, initial encounter for fracture: Secondary | ICD-10-CM | POA: Diagnosis present

## 2014-10-11 DIAGNOSIS — F419 Anxiety disorder, unspecified: Secondary | ICD-10-CM | POA: Diagnosis present

## 2014-10-11 DIAGNOSIS — R911 Solitary pulmonary nodule: Secondary | ICD-10-CM | POA: Diagnosis present

## 2014-10-11 DIAGNOSIS — Z8551 Personal history of malignant neoplasm of bladder: Secondary | ICD-10-CM

## 2014-10-11 DIAGNOSIS — J449 Chronic obstructive pulmonary disease, unspecified: Secondary | ICD-10-CM | POA: Diagnosis present

## 2014-10-11 DIAGNOSIS — E119 Type 2 diabetes mellitus without complications: Secondary | ICD-10-CM | POA: Diagnosis present

## 2014-10-11 DIAGNOSIS — H409 Unspecified glaucoma: Secondary | ICD-10-CM | POA: Diagnosis present

## 2014-10-11 DIAGNOSIS — Z87891 Personal history of nicotine dependence: Secondary | ICD-10-CM

## 2014-10-11 DIAGNOSIS — I4891 Unspecified atrial fibrillation: Secondary | ICD-10-CM | POA: Diagnosis present

## 2014-10-11 DIAGNOSIS — I10 Essential (primary) hypertension: Secondary | ICD-10-CM | POA: Diagnosis present

## 2014-10-11 DIAGNOSIS — G4733 Obstructive sleep apnea (adult) (pediatric): Secondary | ICD-10-CM | POA: Diagnosis present

## 2014-10-11 DIAGNOSIS — Z96659 Presence of unspecified artificial knee joint: Secondary | ICD-10-CM

## 2014-10-11 DIAGNOSIS — E039 Hypothyroidism, unspecified: Secondary | ICD-10-CM | POA: Diagnosis present

## 2014-10-11 DIAGNOSIS — E559 Vitamin D deficiency, unspecified: Secondary | ICD-10-CM | POA: Diagnosis present

## 2014-10-11 DIAGNOSIS — M1710 Unilateral primary osteoarthritis, unspecified knee: Secondary | ICD-10-CM | POA: Diagnosis present

## 2014-10-11 DIAGNOSIS — Z96652 Presence of left artificial knee joint: Secondary | ICD-10-CM

## 2014-10-11 HISTORY — PX: TOTAL KNEE ARTHROPLASTY: SHX125

## 2014-10-11 LAB — GLUCOSE, CAPILLARY
GLUCOSE-CAPILLARY: 108 mg/dL — AB (ref 65–99)
GLUCOSE-CAPILLARY: 105 mg/dL — AB (ref 65–99)
GLUCOSE-CAPILLARY: 208 mg/dL — AB (ref 65–99)
Glucose-Capillary: 101 mg/dL — ABNORMAL HIGH (ref 65–99)

## 2014-10-11 SURGERY — ARTHROPLASTY, KNEE, TOTAL
Anesthesia: Monitor Anesthesia Care | Site: Knee | Laterality: Left

## 2014-10-11 MED ORDER — ACETAMINOPHEN 500 MG PO TABS: 1000.0000 mg | ORAL_TABLET | Freq: Once | ORAL | Status: DC

## 2014-10-11 MED ORDER — ASPIRIN 81 MG PO CHEW
81.0000 mg | CHEWABLE_TABLET | Freq: Every day | ORAL | Status: DC
  Administered 2014-10-11 – 2014-10-13 (×3): 81 mg via ORAL
  Filled 2014-10-11 (×3): qty 1

## 2014-10-11 MED ORDER — ACETAMINOPHEN 325 MG PO TABS
650.0000 mg | ORAL_TABLET | Freq: Four times a day (QID) | ORAL | Status: DC | PRN
  Administered 2014-10-11 – 2014-10-12 (×2): 650 mg via ORAL
  Filled 2014-10-11 (×2): qty 2

## 2014-10-11 MED ORDER — METOCLOPRAMIDE HCL 5 MG PO TABS: 5.0000 mg | ORAL_TABLET | Freq: Three times a day (TID) | ORAL | Status: DC | PRN

## 2014-10-11 MED ORDER — DILTIAZEM HCL ER COATED BEADS 180 MG PO CP24
180.0000 mg | ORAL_CAPSULE | Freq: Every day | ORAL | Status: DC
  Administered 2014-10-12 – 2014-10-13 (×2): 180 mg via ORAL
  Filled 2014-10-11 (×2): qty 1

## 2014-10-11 MED ORDER — OXYCODONE HCL 5 MG/5ML PO SOLN: 5.0000 mg | Freq: Once | ORAL | Status: AC | PRN

## 2014-10-11 MED ORDER — FENTANYL CITRATE (PF) 100 MCG/2ML IJ SOLN
INTRAMUSCULAR | Status: DC | PRN
  Administered 2014-10-11: 50 ug via INTRAVENOUS

## 2014-10-11 MED ORDER — RIVAROXABAN 10 MG PO TABS
10.0000 mg | ORAL_TABLET | Freq: Every day | ORAL | Status: DC
  Administered 2014-10-12 – 2014-10-13 (×2): 10 mg via ORAL
  Filled 2014-10-11 (×2): qty 1

## 2014-10-11 MED ORDER — LACTATED RINGERS IV SOLN
INTRAVENOUS | Status: DC
  Administered 2014-10-11 (×2): via INTRAVENOUS

## 2014-10-11 MED ORDER — OXYCODONE HCL 5 MG PO TABS
5.0000 mg | ORAL_TABLET | ORAL | Status: DC | PRN
  Administered 2014-10-12 – 2014-10-13 (×7): 10 mg via ORAL
  Filled 2014-10-11 (×7): qty 2

## 2014-10-11 MED ORDER — GABAPENTIN 100 MG PO CAPS
100.0000 mg | ORAL_CAPSULE | Freq: Every day | ORAL | Status: DC
  Administered 2014-10-11 – 2014-10-12 (×2): 100 mg via ORAL
  Filled 2014-10-11 (×2): qty 1

## 2014-10-11 MED ORDER — POLYETHYLENE GLYCOL 3350 17 G PO PACK
17.0000 g | PACK | Freq: Every day | ORAL | Status: DC
  Administered 2014-10-12 – 2014-10-13 (×2): 17 g via ORAL
  Filled 2014-10-11 (×3): qty 1

## 2014-10-11 MED ORDER — ONDANSETRON HCL 4 MG PO TABS: 4.0000 mg | ORAL_TABLET | Freq: Four times a day (QID) | ORAL | Status: DC | PRN

## 2014-10-11 MED ORDER — FENTANYL CITRATE (PF) 100 MCG/2ML IJ SOLN
INTRAMUSCULAR | Status: DC
Start: 2014-10-11 — End: 2014-10-11
  Filled 2014-10-11: qty 2

## 2014-10-11 MED ORDER — PROPOFOL INFUSION 10 MG/ML OPTIME
INTRAVENOUS | Status: DC | PRN
  Administered 2014-10-11: 50 ug/kg/min via INTRAVENOUS

## 2014-10-11 MED ORDER — ALBUTEROL SULFATE HFA 108 (90 BASE) MCG/ACT IN AERS: 2.0000 | INHALATION_SPRAY | RESPIRATORY_TRACT | Status: DC | PRN

## 2014-10-11 MED ORDER — FLUTICASONE PROPIONATE 50 MCG/ACT NA SUSP
2.0000 | Freq: Every day | NASAL | Status: DC
  Administered 2014-10-13: 2 via NASAL
  Filled 2014-10-11: qty 16

## 2014-10-11 MED ORDER — METHOCARBAMOL 1000 MG/10ML IJ SOLN
500.0000 mg | Freq: Four times a day (QID) | INTRAVENOUS | Status: DC | PRN
  Filled 2014-10-11: qty 5

## 2014-10-11 MED ORDER — OXYCODONE-ACETAMINOPHEN 5-325 MG PO TABS: 1.0000 | ORAL_TABLET | ORAL | Status: DC | PRN

## 2014-10-11 MED ORDER — METFORMIN HCL 500 MG PO TABS
500.0000 mg | ORAL_TABLET | Freq: Two times a day (BID) | ORAL | Status: DC
  Administered 2014-10-11 – 2014-10-13 (×4): 500 mg via ORAL
  Filled 2014-10-11 (×4): qty 1

## 2014-10-11 MED ORDER — CEFAZOLIN SODIUM-DEXTROSE 2-3 GM-% IV SOLR
2.0000 g | Freq: Four times a day (QID) | INTRAVENOUS | Status: AC
  Administered 2014-10-11 (×2): 2 g via INTRAVENOUS
  Filled 2014-10-11 (×2): qty 50

## 2014-10-11 MED ORDER — CHLORHEXIDINE GLUCONATE 4 % EX LIQD: 60.0000 mL | Freq: Once | CUTANEOUS | Status: DC

## 2014-10-11 MED ORDER — HYDROMORPHONE HCL 1 MG/ML IJ SOLN
INTRAMUSCULAR | Status: AC
  Filled 2014-10-11: qty 1

## 2014-10-11 MED ORDER — HYDROMORPHONE HCL 1 MG/ML IJ SOLN
0.2500 mg | INTRAMUSCULAR | Status: DC | PRN
  Administered 2014-10-11 (×4): 0.5 mg via INTRAVENOUS

## 2014-10-11 MED ORDER — MIDAZOLAM HCL 2 MG/2ML IJ SOLN
INTRAMUSCULAR | Status: AC
  Filled 2014-10-11: qty 2

## 2014-10-11 MED ORDER — DULOXETINE HCL 30 MG PO CPEP
30.0000 mg | ORAL_CAPSULE | Freq: Every evening | ORAL | Status: DC
  Administered 2014-10-11 – 2014-10-12 (×2): 30 mg via ORAL
  Filled 2014-10-11 (×2): qty 1

## 2014-10-11 MED ORDER — ALENDRONATE SODIUM 70 MG PO TABS: 70.0000 mg | ORAL_TABLET | ORAL | Status: DC

## 2014-10-11 MED ORDER — SODIUM CHLORIDE 0.45 % IV SOLN
INTRAVENOUS | Status: DC
Start: 2014-10-11 — End: 2014-10-13
  Administered 2014-10-11 – 2014-10-12 (×2): via INTRAVENOUS

## 2014-10-11 MED ORDER — IRBESARTAN 75 MG PO TABS
75.0000 mg | ORAL_TABLET | Freq: Every day | ORAL | Status: DC
  Administered 2014-10-11 – 2014-10-13 (×3): 75 mg via ORAL
  Filled 2014-10-11 (×2): qty 1

## 2014-10-11 MED ORDER — RIVAROXABAN 10 MG PO TABS: 10.0000 mg | ORAL_TABLET | Freq: Every day | ORAL | Status: DC

## 2014-10-11 MED ORDER — ACETAMINOPHEN 325 MG PO TABS: 325.0000 mg | ORAL_TABLET | ORAL | Status: DC | PRN

## 2014-10-11 MED ORDER — ACETAMINOPHEN 650 MG RE SUPP: 650.0000 mg | Freq: Four times a day (QID) | RECTAL | Status: DC | PRN

## 2014-10-11 MED ORDER — ATORVASTATIN CALCIUM 10 MG PO TABS
10.0000 mg | ORAL_TABLET | Freq: Every day | ORAL | Status: DC
  Administered 2014-10-11 – 2014-10-13 (×3): 10 mg via ORAL
  Filled 2014-10-11 (×3): qty 1

## 2014-10-11 MED ORDER — ALBUTEROL SULFATE (2.5 MG/3ML) 0.083% IN NEBU: 2.5000 mg | INHALATION_SOLUTION | Freq: Four times a day (QID) | RESPIRATORY_TRACT | Status: DC | PRN

## 2014-10-11 MED ORDER — ONDANSETRON HCL 4 MG PO TABS: 4.0000 mg | ORAL_TABLET | Freq: Three times a day (TID) | ORAL | Status: DC | PRN

## 2014-10-11 MED ORDER — METOCLOPRAMIDE HCL 5 MG/ML IJ SOLN: 5.0000 mg | Freq: Three times a day (TID) | INTRAMUSCULAR | Status: DC | PRN

## 2014-10-11 MED ORDER — TIZANIDINE HCL 4 MG PO TABS: 4.0000 mg | ORAL_TABLET | Freq: Four times a day (QID) | ORAL | Status: DC | PRN

## 2014-10-11 MED ORDER — BUPIVACAINE IN DEXTROSE 0.75-8.25 % IT SOLN
INTRATHECAL | Status: DC | PRN
  Administered 2014-10-11: 1.8 mL via INTRATHECAL

## 2014-10-11 MED ORDER — OXYCODONE HCL 5 MG PO TABS
5.0000 mg | ORAL_TABLET | Freq: Once | ORAL | Status: AC | PRN
  Administered 2014-10-11: 5 mg via ORAL

## 2014-10-11 MED ORDER — ONDANSETRON HCL 4 MG/2ML IJ SOLN
INTRAMUSCULAR | Status: DC | PRN
  Administered 2014-10-11: 4 mg via INTRAVENOUS

## 2014-10-11 MED ORDER — ALPRAZOLAM 0.25 MG PO TABS: 0.2500 mg | ORAL_TABLET | Freq: Every evening | ORAL | Status: DC | PRN

## 2014-10-11 MED ORDER — PHENYLEPHRINE HCL 10 MG/ML IJ SOLN
10.0000 mg | INTRAVENOUS | Status: DC | PRN
  Administered 2014-10-11: 10 ug/min via INTRAVENOUS

## 2014-10-11 MED ORDER — VANCOMYCIN HCL IN DEXTROSE 1-5 GM/200ML-% IV SOLN
1000.0000 mg | INTRAVENOUS | Status: AC
  Filled 2014-10-11: qty 200

## 2014-10-11 MED ORDER — LEVOTHYROXINE SODIUM 100 MCG PO TABS
100.0000 ug | ORAL_TABLET | ORAL | Status: DC
  Administered 2014-10-12 – 2014-10-13 (×2): 100 ug via ORAL
  Filled 2014-10-11 (×2): qty 1

## 2014-10-11 MED ORDER — METHOCARBAMOL 500 MG PO TABS
500.0000 mg | ORAL_TABLET | Freq: Four times a day (QID) | ORAL | Status: DC | PRN
  Administered 2014-10-11: 500 mg via ORAL

## 2014-10-11 MED ORDER — SODIUM CHLORIDE 0.9 % IR SOLN
Status: DC | PRN
  Administered 2014-10-11: 1000 mL

## 2014-10-11 MED ORDER — ACETAMINOPHEN 160 MG/5ML PO SOLN
325.0000 mg | ORAL | Status: DC | PRN
  Filled 2014-10-11: qty 20.3

## 2014-10-11 MED ORDER — CEFAZOLIN SODIUM-DEXTROSE 2-3 GM-% IV SOLR
INTRAVENOUS | Status: AC
  Filled 2014-10-11: qty 50

## 2014-10-11 MED ORDER — LATANOPROST 0.005 % OP SOLN
1.0000 [drp] | Freq: Every evening | OPHTHALMIC | Status: DC
  Administered 2014-10-11 – 2014-10-12 (×2): 1 [drp] via OPHTHALMIC
  Filled 2014-10-11: qty 2.5

## 2014-10-11 MED ORDER — CEFAZOLIN SODIUM-DEXTROSE 2-3 GM-% IV SOLR
2.0000 g | INTRAVENOUS | Status: AC
  Administered 2014-10-11: 2 g via INTRAVENOUS

## 2014-10-11 MED ORDER — PROPOFOL 10 MG/ML IV BOLUS
INTRAVENOUS | Status: AC
  Filled 2014-10-11: qty 20

## 2014-10-11 MED ORDER — LEVOTHYROXINE SODIUM 50 MCG PO TABS: 50.0000 ug | ORAL_TABLET | Freq: Every day | ORAL | Status: DC

## 2014-10-11 MED ORDER — MENTHOL 3 MG MT LOZG: 1.0000 | LOZENGE | OROMUCOSAL | Status: DC | PRN

## 2014-10-11 MED ORDER — PHENOL 1.4 % MT LIQD: 1.0000 | OROMUCOSAL | Status: DC | PRN

## 2014-10-11 MED ORDER — FENTANYL CITRATE (PF) 250 MCG/5ML IJ SOLN
INTRAMUSCULAR | Status: AC
  Filled 2014-10-11: qty 5

## 2014-10-11 MED ORDER — HYDROMORPHONE HCL 1 MG/ML IJ SOLN
1.0000 mg | INTRAMUSCULAR | Status: DC | PRN
  Administered 2014-10-11 – 2014-10-12 (×4): 1 mg via INTRAVENOUS
  Filled 2014-10-11 (×4): qty 1

## 2014-10-11 MED ORDER — DEXAMETHASONE SODIUM PHOSPHATE 10 MG/ML IJ SOLN
10.0000 mg | Freq: Once | INTRAMUSCULAR | Status: AC
  Administered 2014-10-12: 10 mg via INTRAVENOUS
  Filled 2014-10-11: qty 1

## 2014-10-11 MED ORDER — DEXTROSE-NACL 5-0.45 % IV SOLN: 100.0000 mL/h | INTRAVENOUS | Status: DC

## 2014-10-11 MED ORDER — ONDANSETRON HCL 4 MG/2ML IJ SOLN: 4.0000 mg | Freq: Four times a day (QID) | INTRAMUSCULAR | Status: DC | PRN

## 2014-10-11 MED ORDER — ONDANSETRON HCL 4 MG/2ML IJ SOLN
INTRAMUSCULAR | Status: AC
  Filled 2014-10-11: qty 2

## 2014-10-11 MED ORDER — VANCOMYCIN HCL IN DEXTROSE 1-5 GM/200ML-% IV SOLN
INTRAVENOUS | Status: AC
  Administered 2014-10-11: 1000 mg via INTRAVENOUS
  Filled 2014-10-11: qty 200

## 2014-10-11 MED ORDER — LEVOTHYROXINE SODIUM 50 MCG PO TABS
50.0000 ug | ORAL_TABLET | ORAL | Status: DC
Start: 2014-10-16 — End: 2014-10-13

## 2014-10-11 MED ORDER — OXYCODONE HCL 5 MG PO TABS
ORAL_TABLET | ORAL | Status: AC
  Filled 2014-10-11: qty 1

## 2014-10-11 MED ORDER — METHOCARBAMOL 500 MG PO TABS
ORAL_TABLET | ORAL | Status: AC
  Filled 2014-10-11: qty 1

## 2014-10-11 SURGICAL SUPPLY — 55 items
BANDAGE ESMARK 6X9 LF (GAUZE/BANDAGES/DRESSINGS) ×1 IMPLANT
BLADE SAG 18X100X1.27 (BLADE) ×3 IMPLANT
BNDG COHESIVE 6X5 TAN STRL LF (GAUZE/BANDAGES/DRESSINGS) ×3 IMPLANT
BNDG ESMARK 6X9 LF (GAUZE/BANDAGES/DRESSINGS) ×3
BOWL SMART MIX CTS (DISPOSABLE) ×3 IMPLANT
CAPT KNEE TOTAL 3 ×3 IMPLANT
CLOSURE STERI-STRIP 1/2X4 (GAUZE/BANDAGES/DRESSINGS) ×1
CLSR STERI-STRIP ANTIMIC 1/2X4 (GAUZE/BANDAGES/DRESSINGS) ×2 IMPLANT
COVER SURGICAL LIGHT HANDLE (MISCELLANEOUS) ×3 IMPLANT
CUFF TOURNIQUET SINGLE 34IN LL (TOURNIQUET CUFF) ×3 IMPLANT
DRAPE EXTREMITY T 121X128X90 (DRAPE) ×3 IMPLANT
DRAPE U-SHAPE 47X51 STRL (DRAPES) ×3 IMPLANT
DRSG ADAPTIC 3X8 NADH LF (GAUZE/BANDAGES/DRESSINGS) ×3 IMPLANT
DRSG MEPILEX BORDER 4X8 (GAUZE/BANDAGES/DRESSINGS) ×3 IMPLANT
DURAPREP 26ML APPLICATOR (WOUND CARE) ×6 IMPLANT
ELECT CAUTERY BLADE 6.4 (BLADE) ×3 IMPLANT
ELECT REM PT RETURN 9FT ADLT (ELECTROSURGICAL) ×3
ELECTRODE REM PT RTRN 9FT ADLT (ELECTROSURGICAL) ×1 IMPLANT
FACESHIELD WRAPAROUND (MASK) ×6 IMPLANT
GAUZE SPONGE 4X4 12PLY STRL (GAUZE/BANDAGES/DRESSINGS) ×3 IMPLANT
GLOVE BIO SURGEON STRL SZ7 (GLOVE) ×3 IMPLANT
GLOVE BIO SURGEON STRL SZ7.5 (GLOVE) ×3 IMPLANT
GLOVE BIOGEL PI IND STRL 7.0 (GLOVE) ×1 IMPLANT
GLOVE BIOGEL PI IND STRL 8 (GLOVE) ×1 IMPLANT
GLOVE BIOGEL PI INDICATOR 7.0 (GLOVE) ×2
GLOVE BIOGEL PI INDICATOR 8 (GLOVE) ×2
GOWN STRL REUS W/ TWL LRG LVL3 (GOWN DISPOSABLE) ×2 IMPLANT
GOWN STRL REUS W/ TWL XL LVL3 (GOWN DISPOSABLE) IMPLANT
GOWN STRL REUS W/TWL LRG LVL3 (GOWN DISPOSABLE) ×4
GOWN STRL REUS W/TWL XL LVL3 (GOWN DISPOSABLE)
HANDPIECE INTERPULSE COAX TIP (DISPOSABLE)
IMMOBILIZER KNEE 22 UNIV (SOFTGOODS) ×3 IMPLANT
IMMOBILIZER KNEE 24 THIGH 36 (MISCELLANEOUS) IMPLANT
IMMOBILIZER KNEE 24 UNIV (MISCELLANEOUS)
KIT BASIN OR (CUSTOM PROCEDURE TRAY) ×3 IMPLANT
KIT ROOM TURNOVER OR (KITS) ×3 IMPLANT
LIQUID BAND (GAUZE/BANDAGES/DRESSINGS) IMPLANT
MANIFOLD NEPTUNE II (INSTRUMENTS) ×3 IMPLANT
NS IRRIG 1000ML POUR BTL (IV SOLUTION) ×3 IMPLANT
PACK TOTAL JOINT (CUSTOM PROCEDURE TRAY) ×3 IMPLANT
PACK UNIVERSAL I (CUSTOM PROCEDURE TRAY) ×3 IMPLANT
PAD ARMBOARD 7.5X6 YLW CONV (MISCELLANEOUS) ×3 IMPLANT
SET HNDPC FAN SPRY TIP SCT (DISPOSABLE) IMPLANT
STAPLER VISISTAT 35W (STAPLE) IMPLANT
SUCTION FRAZIER TIP 10 FR DISP (SUCTIONS) ×3 IMPLANT
SUT MNCRL AB 4-0 PS2 18 (SUTURE) ×3 IMPLANT
SUT MON AB 2-0 CT1 27 (SUTURE) ×6 IMPLANT
SUT MON AB 2-0 CT1 36 (SUTURE) ×3 IMPLANT
SUT VIC AB 0 CT1 27 (SUTURE) ×2
SUT VIC AB 0 CT1 27XBRD ANBCTR (SUTURE) ×1 IMPLANT
SUT VIC AB 1 CTX 36 (SUTURE) ×2
SUT VIC AB 1 CTX36XBRD ANBCTR (SUTURE) ×1 IMPLANT
TOWEL OR 17X24 6PK STRL BLUE (TOWEL DISPOSABLE) ×3 IMPLANT
TOWEL OR 17X26 10 PK STRL BLUE (TOWEL DISPOSABLE) ×3 IMPLANT
TRAY FOLEY BAG SILVER LF 14FR (CATHETERS) IMPLANT

## 2014-10-11 NOTE — Progress Notes (Signed)
Orthopedic Tech Progress Note Patient Details:  Taylor [name] [birth year] [id number]  CPM Left Knee CPM Left Knee: On Left Knee Flexion (Degrees): 90 Left Knee Extension (Degrees): 0 Additional Comments: Trapeze bar and folot roll   [name] 10/11/2014, 1:14 PM

## 2014-10-11 NOTE — Op Note (Signed)
DATE OF SURGERY:  10/11/2014 TIME: 11:37 AM  PATIENT NAME:  Taylor [name]   AGE: 81 y.o.    PRE-OPERATIVE DIAGNOSIS:  OA LEFT KNEE  POST-OPERATIVE DIAGNOSIS:  Same  PROCEDURE:  Procedure(s): TOTAL KNEE ARTHROPLASTY   SURGEON:  Dari Carpenito D, MD   ASSISTANT:  Lovett Calender, PA-C, She was present and scrubbed throughout the case, critical for completion in a timely fashion, and for retraction, instrumentation, and closure.    OPERATIVE IMPLANTS: Stryker Triathlon Posterior Stabilized.  Femur size 5, Tibia size 5, Patella size 35 3-peg oval button, with a 9 mm polyethylene insert.   PREOPERATIVE INDICATIONS:  Taylor [name] is a 81 y.o. 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   The risks, benefits, and alternatives were discussed at length including but not limited to the risks of infection, bleeding, nerve injury, stiffness, blood clots, the need for revision surgery, cardiopulmonary complications, among others, and they were willing to proceed.   OPERATIVE DESCRIPTION:  The patient was brought to the operative room and placed in a supine position.  General anesthesia was administered.  IV antibiotics were given.  The lower extremity was prepped and draped in the usual sterile fashion.  Time out was performed.  The leg was elevated and exsanguinated and the tourniquet was inflated.  Anterior approach was performed.  The patella was everted and osteophytes were removed.  The anterior horn of the medial and lateral meniscus was removed.   He had significant loss of his lateral osteochondral femoral condyle. Cartilage pieces were loose and floating within the knee bone was denuded beneath this and  corticated.  The distal femur was opened with the drill and the intramedullary distal femoral cutting jig was utilized, set at 5 degrees resecting 10 mm off the distal femur.  Care was taken to protect the collateral ligaments.  The distal femoral sizing jig was applied, taking care to avoid notching.  Then the 4-in-1 cutting jig was applied and the anterior and posterior femur was cut, along with the chamfer cuts.  All posterior osteophytes were removed.  The flexion gap was then measured and was symmetric with the extension gap.  After making the femoral cuts are further performed on examination of his lateral condyle there is a small defect in the bone this was very small both chamfer cuts had good purchase as did the pegs that I elected to place.  Then the extramedullary tibial cutting jig was utilized making the appropriate cut using the anterior tibial crest as a reference building in appropriate posterior slope.  Care was taken during the cut to protect the medial and collateral ligaments.  The proximal tibia was removed along with the posterior horns of the menisci.  The PCL was sacrificed.    The extensor gap was measured and was approximately 71m.    I completed the distal femoral preparation using the appropriate jig to prepare the box.  The patella was then measured, and cut with the saw.    The proximal tibia sized and prepared accordingly with the reamer and the punch, and then all components were trialed with the 945mpoly insert.  The knee was found to have excellent balance and full motion.    The above named components were then cemented into place and all  excess cement was removed.  The real polyethylene implant was placed.  The knee was easily taken through a range of motion and the patella tracked well and the knee irrigated copiously and the parapatellar and subcutaneous tissue closed with vicryl, and monocryl with steri strips for the skin.  The wounds were dressed with  sterile gauze and the tourniquet released and the patient was awakened and returned to the PACU in stable and satisfactory condition.  There were no complications.  Total tourniquet time was 75 minutes.   POSTOPERATIVE PLAN: post op Abx, DVT px: SCD's, TED's, Early ambulation and Xarelto  This note was generated using a template and dragon dictation system. In light of that, I have reviewed the note and all aspects of it are applicable to this case. Any dictation errors are due to the computerized dictation system.

## 2014-10-11 NOTE — Addendum Note (Signed)
Addendum  created 10/11/14 1402 by Oleta Mouse, MD   Modules edited: Anesthesia Events, Narrator   Narrator:  Narrator: Event Log Edited

## 2014-10-11 NOTE — Anesthesia Procedure Notes (Addendum)
Spinal Patient location during procedure: OR Staffing Anesthesiologist: MOSER, CHRISTOPHER Preanesthetic Checklist Completed: patient identified, surgical consent, pre-op evaluation, timeout performed, IV checked, risks and benefits discussed and monitors and equipment checked Spinal Block Patient position: sitting Prep: site prepped and draped and DuraPrep Patient monitoring: heart rate, cardiac monitor, continuous pulse ox and blood pressure Approach: midline Location: L3-4 Injection technique: single-shot Needle Needle type: Pencan  Needle gauge: 24 G Needle length: 10 cm Assessment Sensory level: T4  Procedure Name: MAC Date/Time: 10/11/2014 10:16 AM Performed by: Rush Farmer E Pre-anesthesia Checklist: Patient identified, Emergency Drugs available, Suction available, Patient being monitored and Timeout performed Patient Re-evaluated:Patient Re-evaluated prior to inductionOxygen Delivery Method: Simple face mask Preoxygenation: Pre-oxygenation with 100% oxygen Intubation Type: IV induction Placement Confirmation: positive ETCO2

## 2014-10-11 NOTE — Discharge Instructions (Signed)
INSTRUCTIONS AFTER JOINT REPLACEMENT  ° °o Remove items at home which could result in a fall. This includes throw rugs or furniture in walking pathways °o ICE to the affected joint every three hours while awake for 30 minutes at a time, for at least the first 3-5 days, and then as needed for pain and swelling.  Continue to use ice for pain and swelling. You may notice swelling that will progress down to the foot and ankle.  This is normal after surgery.  Elevate your leg when you are not up walking on it.   °o Continue to use the breathing machine you got in the hospital (incentive spirometer) which will help keep your temperature down.  It is common for your temperature to cycle up and down following surgery, especially at night when you are not up moving around and exerting yourself.  The breathing machine keeps your lungs expanded and your temperature down. ° ° °DIET:  As you were doing prior to hospitalization, we recommend a well-balanced diet. ° °DRESSING / WOUND CARE / SHOWERING ° °Keep the surgical dressing until follow up.  IF THE DRESSING FALLS OFF or the wound gets wet inside, change the dressing with sterile gauze.  Please use good hand washing techniques before changing the dressing.  Do not use any lotions or creams on the incision until instructed by your surgeon.   ° °ACTIVITY ° °o Increase activity slowly as tolerated, but follow the weight bearing instructions below.   °o No driving for 6 weeks or until further direction given by your physician.  You cannot drive while taking narcotics.  °o No lifting or carrying greater than 10 lbs. until further directed by your surgeon. °o Avoid periods of inactivity such as sitting longer than an hour when not asleep. This helps prevent blood clots.  °o You may return to work once you are authorized by your doctor.  ° ° ° °WEIGHT BEARING  ° °Weight bearing as tolerated with assist device (walker, cane, etc) as directed, use it as long as suggested by your  surgeon or therapist, typically at least 4-6 weeks. ° ° °EXERCISES ° °Results after joint replacement surgery are often greatly improved when you follow the exercise, range of motion and muscle strengthening exercises prescribed by your doctor. Safety measures are also important to protect the joint from further injury. Any time any of these exercises cause you to have increased pain or swelling, decrease what you are doing until you are comfortable again and then slowly increase them. If you have problems or questions, call your caregiver or physical therapist for advice.  ° °Rehabilitation is important following a joint replacement. After just a few days of immobilization, the muscles of the leg can become weakened and shrink (atrophy).  These exercises are designed to build up the tone and strength of the thigh and leg muscles and to improve motion. Often times heat used for twenty to thirty minutes before working out will loosen up your tissues and help with improving the range of motion but do not use heat for the first two weeks following surgery (sometimes heat can increase post-operative swelling).  ° °These exercises can be done on a training (exercise) mat, on the floor, on a table or on a bed. Use whatever works the best and is most comfortable for you.    Use music or television while you are exercising so that the exercises are a pleasant break in your day. This will make your life   better with the exercises acting as a break in your routine that you can look forward to.   Perform all exercises about fifteen times, three times per day or as directed.  You should exercise both the operative leg and the other leg as well. ° °Exercises include: °  °• Quad Sets - Tighten up the muscle on the front of the thigh (Quad) and hold for 5-10 seconds.   °• Straight Leg Raises - With your knee straight (if you were given a brace, keep it on), lift the leg to 60 degrees, hold for 3 seconds, and slowly lower the leg.   Perform this exercise against resistance later as your leg gets stronger.  °• Leg Slides: Lying on your back, slowly slide your foot toward your buttocks, bending your knee up off the floor (only go as far as is comfortable). Then slowly slide your foot back down until your leg is flat on the floor again.  °• Angel Wings: Lying on your back spread your legs to the side as far apart as you can without causing discomfort.  °• Hamstring Strength:  Lying on your back, push your heel against the floor with your leg straight by tightening up the muscles of your buttocks.  Repeat, but this time bend your knee to a comfortable angle, and push your heel against the floor.  You may put a pillow under the heel to make it more comfortable if necessary.  ° °A rehabilitation program following joint replacement surgery can speed recovery and prevent re-injury in the future due to weakened muscles. Contact your doctor or a physical therapist for more information on knee rehabilitation.  ° ° °CONSTIPATION ° °Constipation is defined medically as fewer than three stools per week and severe constipation as less than one stool per week.  Even if you have a regular bowel pattern at home, your normal regimen is likely to be disrupted due to multiple reasons following surgery.  Combination of anesthesia, postoperative narcotics, change in appetite and fluid intake all can affect your bowels.  ° °YOU MUST use at least one of the following options; they are listed in order of increasing strength to get the job done.  They are all available over the counter, and you may need to use some, POSSIBLY even all of these options:   ° °Drink plenty of fluids (prune juice may be helpful) and high fiber foods °Colace 100 mg by mouth twice a day  °Senokot for constipation as directed and as needed Dulcolax (bisacodyl), take with full glass of water  °Miralax (polyethylene glycol) once or twice a day as needed. ° °If you have tried all these things and  are unable to have a bowel movement in the first 3-4 days after surgery call either your surgeon or your primary doctor.   ° °If you experience loose stools or diarrhea, hold the medications until you stool forms back up.  If your symptoms do not get better within 1 week or if they get worse, check with your doctor.  If you experience "the worst abdominal pain ever" or develop nausea or vomiting, please contact the office immediately for further recommendations for treatment. ° ° °ITCHING:  If you experience itching with your medications, try taking only a single pain pill, or even half a pain pill at a time.  You can also use Benadryl over the counter for itching or also to help with sleep.  ° °TED HOSE STOCKINGS:  Use stockings on both legs   until for at least 2 weeks or as directed by physician office. They may be removed at night for sleeping.  MEDICATIONS:  See your medication summary on the After Visit Summary that nursing will review with you.  You may have some home medications which will be placed on hold until you complete the course of blood thinner medication.  It is important for you to complete the blood thinner medication as prescribed.  PRECAUTIONS:  If you experience chest pain or shortness of breath - call 911 immediately for transfer to the hospital emergency department.   If you develop a fever greater that 101 F, purulent drainage from wound, increased redness or drainage from wound, foul odor from the wound/dressing, or calf pain - CONTACT YOUR SURGEON.                                                   FOLLOW-UP APPOINTMENTS:  If you do not already have a post-op appointment, please call the office for an appointment to be seen by your surgeon.  Guidelines for how soon to be seen are listed in your After Visit Summary, but are typically between 1-4 weeks after surgery.  OTHER INSTRUCTIONS:   Knee Replacement:  Do not place pillow under knee, focus on keeping the knee straight  while resting. CPM instructions: 0-90 degrees, 2 hours in the morning, 2 hours in the afternoon, and 2 hours in the evening. Place foam block, curve side up under heel at all times except when in CPM or when walking.  DO NOT modify, tear, cut, or change the foam block in any way.  MAKE SURE YOU:   Understand these instructions.   Get help right away if you are not doing well or get worse.    Thank you for letting us be a part of your medical care team.  It is a privilege we respect greatly.  We hope these instructions will help you stay on track for a fast and full recovery!   Information on my medicine - XARELTO (Rivaroxaban)  This medication education was reviewed with me or my healthcare representative as part of my discharge preparation.  Why was Xarelto prescribed for you? Xarelto was prescribed for you to reduce the risk of blood clots forming after orthopedic surgery. The medical term for these abnormal blood clots is venous thromboembolism (VTE).  What do you need to know about xarelto ? Take your Xarelto ONCE DAILY at the same time every day. You may take it either with or without food.  If you have difficulty swallowing the tablet whole, you may crush it and mix in applesauce just prior to taking your dose.  Take Xarelto exactly as prescribed by your doctor and DO NOT stop taking Xarelto without talking to the doctor who prescribed the medication.  Stopping without other VTE prevention medication to take the place of Xarelto may increase your risk of developing a clot.  After discharge, you should have regular check-up appointments with your healthcare provider that is prescribing your Xarelto.    What do you do if you miss a dose? If you miss a dose, take it as soon as you remember on the same day then continue your regularly scheduled once daily regimen the next day. Do not take two doses of Xarelto on the same day.   Important Safety  Information A possible side  effect of Xarelto is bleeding. You should call your healthcare provider right away if you experience any of the following: ? Bleeding from an injury or your nose that does not stop. ? Unusual colored urine (red or dark brown) or unusual colored stools (red or black). ? Unusual bruising for unknown reasons. ? A serious fall or if you hit your head (even if there is no bleeding).  Some medicines may interact with Xarelto and might increase your risk of bleeding while on Xarelto. To help avoid this, consult your healthcare provider or pharmacist prior to using any new prescription or non-prescription medications, including herbals, vitamins, non-steroidal anti-inflammatory drugs (NSAIDs) and supplements.  This website has more information on Xarelto: [email or web address]-benson.[email or web address]

## 2014-10-11 NOTE — Anesthesia Postprocedure Evaluation (Addendum)
  Anesthesia Post-op Note  Patient: Taylor [name]  Procedure(s) Performed: Procedure(s): TOTAL KNEE ARTHROPLASTY (Left)  Patient Location: PACU  Anesthesia Type:MAC and Spinal  Level of Consciousness: awake  Airway and Oxygen Therapy: Patient Spontanous Breathing  Post-op Pain: mild  Post-op Assessment: Post-op Vital signs reviewed, Patient's Cardiovascular Status Stable, Respiratory Function Stable, Patent Airway, No signs of Nausea or vomiting and Pain level controlled LLE Motor Response: Purposeful movement, Responds to commands LLE Sensation: Numbness (spinal) RLE Motor Response: No movement due to regional block RLE Sensation: Numbness L Sensory Level: S1-Sole of foot, small toes R Sensory Level: S1-Sole of foot, small toes  Post-op Vital Signs: Reviewed and stable  Last Vitals:  Filed Vitals:   10/11/14 1330  BP:   Pulse: 64  Temp:   Resp: 14    Complications: No apparent anesthesia complications

## 2014-10-11 NOTE — Transfer of Care (Signed)
Immediate Anesthesia Transfer of Care Note  Patient: Taylor [name]  Procedure(s) Performed: Procedure(s): TOTAL KNEE ARTHROPLASTY (Left)  Patient Location: PACU  Anesthesia Type:MAC and Spinal  Level of Consciousness: awake, alert  and oriented  Airway & Oxygen Therapy: Patient Spontanous Breathing and Patient connected to face mask oxygen  Post-op Assessment: Report given to RN, Post -op Vital signs reviewed and stable and Patient moving all extremities  Post vital signs: Reviewed and stable  Last Vitals:  Filed Vitals:   10/11/14 0913  BP: 102/63  Pulse: 66  Temp: 36.6 C  Resp: 18    Complications: No apparent anesthesia complications

## 2014-10-11 NOTE — Addendum Note (Signed)
Addendum  created 10/11/14 1404 by Oleta Mouse, MD   Modules edited: Notes Section   Notes Section:  File: [id number]

## 2014-10-11 NOTE — Progress Notes (Signed)
Messages left with Dr Percell Miller and Angelena Sole PA requesting orders.

## 2014-10-11 NOTE — Progress Notes (Signed)
Utilization review completed.  

## 2014-10-11 NOTE — Interval H&P Note (Signed)
History and Physical Interval Note:  10/11/2014 9:21 AM  Taylor [name]  has presented today for surgery, with the diagnosis of OA LEFT KNEE  The various methods of treatment have been discussed with the patient and family. After consideration of risks, benefits and other options for treatment, the patient has consented to  Procedure(s): TOTAL KNEE ARTHROPLASTY (Left) as a surgical intervention .  The patient's history has been reviewed, patient examined, no change in status, stable for surgery.  I have reviewed the patient's chart and labs.  Questions were answered to the patient's satisfaction.     MURPHY, TIMOTHY D

## 2014-10-11 NOTE — Progress Notes (Signed)
Lunch relief by S. Gregson RN 

## 2014-10-11 NOTE — Progress Notes (Signed)
Orthopedic Tech Progress Note Patient Details:  Taylor [name] [birth year] [id number]  Ortho Devices Type of Ortho Device: Knee Immobilizer Ortho Device/Splint Interventions: Application   Cammer, Theodoro Parma 10/11/2014, 5:55 PM

## 2014-10-11 NOTE — Anesthesia Preprocedure Evaluation (Signed)
Anesthesia Evaluation  Patient identified by MRN, date of birth, ID band Patient awake    Reviewed: Allergy & Precautions, NPO status , Patient's Chart, lab work & pertinent test results  Airway Mallampati: II  TM Distance: >3 FB Neck ROM: Full    Dental  (+) Teeth Intact,    Pulmonary sleep apnea , COPD COPD inhaler, former smoker,  breath sounds clear to auscultation        Cardiovascular hypertension, Pt. on medications - angina+ Peripheral Vascular Disease - CAD + dysrhythmias Atrial Fibrillation Rhythm:Regular     Neuro/Psych PSYCHIATRIC DISORDERS Anxiety Depression negative neurological ROS     GI/Hepatic negative GI ROS, Neg liver ROS,   Endo/Other  diabetes, Oral Hypoglycemic AgentsHypothyroidism Morbid obesity  Renal/GU      Musculoskeletal  (+) Arthritis -,   Abdominal   Peds  Hematology negative hematology ROS (+)   Anesthesia Other Findings   Reproductive/Obstetrics                             Anesthesia Physical Anesthesia Plan  ASA: III  Anesthesia Plan: MAC and Spinal   Post-op Pain Management:    Induction: Intravenous  Airway Management Planned: Nasal Cannula  Additional Equipment: None  Intra-op Plan:   Post-operative Plan:   Informed Consent: I have reviewed the patients History and Physical, chart, labs and discussed the procedure including the risks, benefits and alternatives for the proposed anesthesia with the patient or authorized representative who has indicated his/her understanding and acceptance.   Dental advisory given  Plan Discussed with: CRNA and Surgeon  Anesthesia Plan Comments:         Anesthesia Quick Evaluation

## 2014-10-12 ENCOUNTER — Encounter (HOSPITAL_COMMUNITY): Payer: Self-pay | Admitting: Orthopedic Surgery

## 2014-10-12 LAB — GLUCOSE, CAPILLARY
GLUCOSE-CAPILLARY: 135 mg/dL — AB (ref 65–99)
GLUCOSE-CAPILLARY: 138 mg/dL — AB (ref 65–99)
GLUCOSE-CAPILLARY: 294 mg/dL — AB (ref 65–99)

## 2014-10-12 NOTE — Evaluation (Signed)
Occupational Therapy Evaluation Patient Details Name: Taylor [name] MRN: [id number] DOB: 07/16/1928 Today's Date: 10/12/2014    History of Present Illness Lt TKA   Clinical Impression   Patient presenting with decreased I in self care, acute pain, decreased I in functional mobility/transfers.  Patient reports being Mod I  PTA. Patient currently functioning set up A - total A. Patient will benefit from acute OT to increase overall independence in the areas of ADLs, functional mobility, safety in order to safely discharge to SNF for continued OT intervention. Pt limited by pain during evaluation and was unable to shift weight for transfer or functional mobility this session. Pt sitting back into recliner chair and refusing further intervention.     Follow Up Recommendations  SNF    Equipment Recommendations  Other (comment) (defer to next venue of care.)    Recommendations for Other Services  (none)     Precautions / Restrictions Precautions Precautions: Fall Restrictions Weight Bearing Restrictions: Yes LLE Weight Bearing: Weight bearing as tolerated      Mobility Bed Mobility   General bed mobility comments: pt seated in recliner chair upon entering the room and remaining in chair upon exiting.   Transfers Overall transfer level: Needs assistance Equipment used: Rolling walker (2 wheeled) Transfers: Sit to/from Stand Sit to Stand: Max assist         General transfer comment: max A for lift and lowering assistance, cues for hand placement.    Balance Overall balance assessment: Needs assistance Sitting-balance support: Single extremity supported Sitting balance-Leahy Scale: Poor     Standing balance support: Bilateral upper extremity supported Standing balance-Leahy Scale: Poor             ADL Overall ADL's : Needs assistance/impaired                   General ADL Comments: Pt seated in recliner chair upon entering the room and daugther present  in room as well. Pt with 10/10 pain with movement this session and pain limiting ability to engage in tasks this session. Pt would required total A for LB self care and set up A for UB self care in seated position. Pt requiring max A for sit <>stand this session and was unable to shift weight onto L knee in order to take step with R LE.      Vision Vision Assessment?: No apparent visual deficits          Pertinent Vitals/Pain Pain Assessment: 0-10 Pain Score: 10-Worst pain ever Pain Location: L knee Pain Descriptors / Indicators: Aching;Guarding;Grimacing Pain Intervention(s): Limited activity within patient's tolerance;Patient requesting pain meds-RN notified;Repositioned     Hand Dominance Right   Extremity/Trunk Assessment Upper Extremity Assessment Upper Extremity Assessment: Generalized weakness   Lower Extremity Assessment Lower Extremity Assessment: Defer to PT evaluation       Communication Communication Communication: No difficulties   Cognition Arousal/Alertness: Awake/alert Behavior During Therapy: WFL for tasks assessed/performed Overall Cognitive Status: Within Functional Limits for tasks assessed                                Home Living Family/patient expects to be discharged to:: Skilled nursing facility Living Arrangements: Children                               Additional Comments: Planning to stay at SNF before ultimately  returning home where daughter lives with him.       Prior Functioning/Environment Level of Independence: Independent with assistive device(s)        Comments: RW for ambulation/balance    OT Diagnosis: Generalized weakness;Acute pain   OT Problem List: Decreased strength;Decreased knowledge of use of DME or AE;Decreased activity tolerance;Impaired balance (sitting and/or standing);Decreased safety awareness;Pain   OT Treatment/Interventions: Self-care/ADL training;Balance training;Therapeutic  exercise;Therapeutic activities;Energy conservation;DME and/or AE instruction;Patient/family education    OT Goals(Current goals can be found in the care plan section) Acute Rehab OT Goals Patient Stated Goal: go to SNF before going home OT Goal Formulation: With patient/family Time For Goal Achievement: 10/26/14 Potential to Achieve Goals: Good ADL Goals Pt Will Perform Grooming: with modified independence;sitting Pt Will Perform Upper Body Bathing: with set-up;sitting Pt Will Perform Lower Body Bathing: with min guard assist;with adaptive equipment;sit to/from stand Pt Will Perform Upper Body Dressing: with set-up;sitting Pt Will Perform Lower Body Dressing: with min guard assist;with adaptive equipment;sit to/from stand Pt Will Transfer to Toilet: with min guard assist;bedside commode;ambulating Pt Will Perform Toileting - Clothing Manipulation and hygiene: with min guard assist;sit to/from stand  OT Frequency: Min 2X/week   Barriers to D/C:    none known at this time          End of Session Equipment Utilized During Treatment: Rolling walker  Activity Tolerance: Patient limited by pain Patient left: in chair;with call bell/phone within reach;with chair alarm set;with family/visitor present   Time: 1347-1415 OT Time Calculation (min): 28 min Charges:  OT General Charges $OT Visit: 1 Procedure OT Evaluation $Initial OT Evaluation Tier I: 1 Procedure OT Treatments $Self Care/Home Management : 8-22 mins  Taylor Semen, MS, OTR/L 10/12/2014, 3:14 PM

## 2014-10-12 NOTE — Progress Notes (Signed)
Physical Therapy Treatment Patient Details Name: Taylor [name] MRN: [id number] DOB: [birth year] Today's Date: 10/12/2014    History of Present Illness Lt TKA    PT Comments    Slow progress with ambulation and general mobility. Will continue with skilled PT to address strength, ROM and functional mobility. Continue to recommend SNF upon D/C.   Follow Up Recommendations  SNF     Equipment Recommendations  None recommended by PT    Recommendations for Other Services       Precautions / Restrictions Precautions Precautions: Fall Restrictions Weight Bearing Restrictions: Yes LLE Weight Bearing: Weight bearing as tolerated    Mobility  Bed Mobility Overal bed mobility: Needs Assistance Bed Mobility: Sit to Supine       Sit to supine: Mod assist   General bed mobility comments: assist with bilateral LEs  Transfers Overall transfer level: Needs assistance Equipment used: Rolling walker (2 wheeled) Transfers: Sit to/from Stand Sit to Stand: Mod assist         General transfer comment: slow transition, assist primarily when moving hands from chair to rw  Ambulation/Gait Ambulation/Gait assistance: Min assist Ambulation Distance (Feet): 5 Feet Assistive device: Rolling walker (2 wheeled) Gait Pattern/deviations: Step-to pattern;Decreased step length - right;Decreased step length - left;Decreased weight shift to left Gait velocity: significantly decreased   General Gait Details: very slow pattern, 2 standing breaks taken.    Stairs            Wheelchair Mobility    Modified Rankin (Stroke Patients Only)       Balance Overall balance assessment: Needs assistance Sitting-balance support: Single extremity supported Sitting balance-Leahy Scale: Poor     Standing balance support: Bilateral upper extremity supported Standing balance-Leahy Scale: Poor                      Cognition Arousal/Alertness: Awake/alert Behavior During Therapy: WFL  for tasks assessed/performed Overall Cognitive Status: Within Functional Limits for tasks assessed                      Exercises Total Joint Exercises Ankle Circles/Pumps: Both;10 reps;AROM Quad Sets: Left;Strengthening;10 reps Heel Slides: AAROM;Left;10 reps Knee Flexion: Left;5 reps;Seated Goniometric ROM: 14-35    General Comments        Pertinent Vitals/Pain Pain Assessment: Faces Pain Score: 10-Worst pain ever Faces Pain Scale: Hurts even more Pain Location: Lt knee Pain Descriptors / Indicators: Grimacing;Guarding Pain Intervention(s): Limited activity within patient's tolerance;Monitored during session    Home Living Family/patient expects to be discharged to:: Skilled nursing facility Living Arrangements: Children             Additional Comments: Planning to stay at SNF before ultimately returning home where daughter lives with him.     Prior Function Level of Independence: Independent with assistive device(s)      Comments: RW for ambulation/balance   PT Goals (current goals can now be found in the care plan section) Acute Rehab PT Goals Patient Stated Goal: Get to bed.  PT Goal Formulation: With patient Time For Goal Achievement: 10/26/14 Potential to Achieve Goals: Good Progress towards PT goals: Progressing toward goals    Frequency  7X/week    PT Plan Current plan remains appropriate    Co-evaluation             End of Session Equipment Utilized During Treatment: Gait belt;Oxygen Activity Tolerance: Patient tolerated treatment well Patient left: in bed;with call bell/phone within reach;with family/visitor  present;with bed alarm set     Time: 3267-1245 PT Time Calculation (min) (ACUTE ONLY): 28 min  Charges:  $Gait Training: 8-22 mins $Therapeutic Exercise: 8-22 mins                    G Codes:      Cassell Clement, PT, CSCS Pager [phone] Office [phone]  10/12/2014, 3:45 PM

## 2014-10-12 NOTE — Progress Notes (Signed)
Orthopedic Tech Progress Note Patient Details:  Taylor [name] 1928-10-04 [id number] On cpm at 9:00 pm 0-60 Patient ID: Taylor [name], male   DOB: Aug 21, 1928, 81 y.o.   MRN: [id number]   Braulio Bosch 10/12/2014, 6:56 PM

## 2014-10-12 NOTE — Evaluation (Signed)
Physical Therapy Evaluation Patient Details Name: Taylor [name] MRN: [id number] DOB: 07/14/1928 Today's Date: 10/12/2014   History of Present Illness  Lt TKA  Clinical Impression  Pt is s/p TKA resulting in the deficits listed below (see PT Problem List). Pt will benefit from skilled PT to increase their independence and safety with mobility to allow discharge to the venue listed below. Anticipate D/C to SNF for further rehabilitation needs.      Follow Up Recommendations SNF    Equipment Recommendations  None recommended by PT    Recommendations for Other Services       Precautions / Restrictions Restrictions Weight Bearing Restrictions: Yes LLE Weight Bearing: Weight bearing as tolerated      Mobility  Bed Mobility Overal bed mobility: Needs Assistance Bed Mobility: Supine to Sit     Supine to sit: Mod assist;HOB elevated     General bed mobility comments: Assist with LLE and initiation of trunk to sitting  Transfers Overall transfer level: Needs assistance Equipment used: Rolling walker (2 wheeled) Transfers: Sit to/from Stand Sit to Stand: Mod assist         General transfer comment: cues for hand placement, using rail to push up with.   Ambulation/Gait Ambulation/Gait assistance: Min assist Ambulation Distance (Feet): 3 Feet Assistive device: Rolling walker (2 wheeled) Gait Pattern/deviations: Step-to pattern Gait velocity: decreased   General Gait Details: slow pattern with limited weight bearing on LLE  Stairs            Wheelchair Mobility    Modified Rankin (Stroke Patients Only)       Balance Overall balance assessment: Needs assistance Sitting-balance support: Single extremity supported Sitting balance-Leahy Scale: Poor     Standing balance support: Bilateral upper extremity supported Standing balance-Leahy Scale: Poor Standing balance comment: using rw                             Pertinent Vitals/Pain Pain  Assessment: 0-10 Pain Score: 5  Pain Location: Lt knee Pain Descriptors / Indicators: Sore Pain Intervention(s): Monitored during session    Home Living Family/patient expects to be discharged to:: Skilled nursing facility                 Additional Comments: Planning to stay at SNF before ultimately returning home.     Prior Function Level of Independence: Independent with assistive device(s)         Comments: using rw     Hand Dominance        Extremity/Trunk Assessment               Lower Extremity Assessment: LLE deficits/detail   LLE Deficits / Details: poor quad activiation and assist needed with SLR     Communication   Communication: No difficulties  Cognition Arousal/Alertness: Awake/alert Behavior During Therapy: WFL for tasks assessed/performed Overall Cognitive Status: Within Functional Limits for tasks assessed                      General Comments General comments (skin integrity, edema, etc.): Patient SpO2 decrease to 85% with transfer. Returned to 69 with cues for deep breathing.     Exercises Total Joint Exercises Ankle Circles/Pumps: Both;10 reps;AROM Quad Sets: Left;Strengthening;10 reps Gluteal Sets: Both;Strengthening;10 reps Knee Flexion: Left;5 reps;Seated      Assessment/Plan    PT Assessment Patient needs continued PT services  PT Diagnosis Difficulty walking;Generalized weakness;Acute pain   PT  Problem List Decreased strength;Decreased range of motion;Decreased activity tolerance;Decreased balance;Decreased mobility;Pain  PT Treatment Interventions DME instruction;Gait training;Functional mobility training;Therapeutic activities;Therapeutic exercise;Balance training;Patient/family education   PT Goals (Current goals can be found in the Care Plan section) Acute Rehab PT Goals Patient Stated Goal: go to SNF before going home PT Goal Formulation: With patient Time For Goal Achievement: 10/26/14 Potential to  Achieve Goals: Good    Frequency 7X/week   Barriers to discharge        Co-evaluation               End of Session Equipment Utilized During Treatment: Gait belt;Oxygen               Time: 3383-2919 PT Time Calculation (min) (ACUTE ONLY): 32 min   Charges:   PT Evaluation $Initial PT Evaluation Tier I: 1 Procedure PT Treatments $Therapeutic Activity: 8-22 mins   PT G Codes:        Cassell Clement, PT, CSCS Pager [phone] Office [phone]  10/12/2014, 10:15 AM

## 2014-10-12 NOTE — Clinical Social Work Note (Signed)
CSW consulted for possible SNF placement at time of discharge. Patient to be discharged to [facility] once medically stable.  Full assessment to follow.  Lubertha Sayres, [place] Clinical Social Work Department Orthopedics [phone]) and Surgical [phone])

## 2014-10-12 NOTE — Progress Notes (Signed)
     Subjective:  POD#1 L TKA. Patient reports pain as moderate.  Resting comfortably in bed at this time.  No issues overnight.   Objective:   VITALS:   Filed Vitals:   10/11/14 1756 10/11/14 2225 10/12/14 0109 10/12/14 0506  BP: 93/50 110/46 114/49 118/56  Pulse: 89 87 88 84  Temp: 98.2 F (36.8 C) 98.2 F (36.8 C) 98.6 F (37 C) 98.6 F (37 C)  TempSrc:  Tympanic Oral Oral  Resp: '18 18 18 18  '$ Height:      Weight:      SpO2: 92% 91% 93% 96%    Neurologically intact ABD soft Neurovascular intact Sensation intact distally Intact pulses distally Dorsiflexion/Plantar flexion intact Incision: dressing C/D/I   Lab Results  Component Value Date   WBC 9.4 09/29/2014   HGB 12.2* 09/29/2014   HCT 38.6* 09/29/2014   MCV 96.3 09/29/2014   PLT 203 09/29/2014   BMET    Component Value Date/Time   NA 137 09/29/2014 1333   NA 140 05/24/2014 1425   NA 140 03/09/2014   NA 144 04/12/2011 1634   K 5.2* 09/29/2014 1333   K 5.0 05/24/2014 1425   K 4.1 04/12/2011 1634   CL 100* 09/29/2014 1333   CL 104 04/12/2011 1634   CO2 30 09/29/2014 1333   CO2 27 05/24/2014 1425   CO2 31 04/12/2011 1634   GLUCOSE 110* 09/29/2014 1333   GLUCOSE 118 05/24/2014 1425   GLUCOSE 159* 04/12/2011 1634   BUN 15 09/29/2014 1333   BUN 17.6 05/24/2014 1425   BUN 24* 03/09/2014   BUN 16 04/12/2011 1634   CREATININE 0.74 09/29/2014 1333   CREATININE 0.9 05/24/2014 1425   CREATININE 0.8 03/09/2014   CREATININE 0.97 04/12/2011 1634   CALCIUM 9.3 09/29/2014 1333   CALCIUM 9.5 05/24/2014 1425   CALCIUM 8.9 04/12/2011 1634   GFRNONAA >60 09/29/2014 1333   GFRNONAA >60 04/12/2011 1634   GFRAA >60 09/29/2014 1333   GFRAA >60 04/12/2011 1634     Assessment/Plan: 1 Day Post-Op   Active Problems:   Arthritis of knee   Up with therapy WBAT in the LLE Xarelto for DVT prophylaxis Will have the patient work with PT today.  Will likely need another night before discharge to  SNF.   Jaedyn Lard Lelan Pons 10/12/2014, 7:01 AM Cell [phone]

## 2014-10-13 DIAGNOSIS — M1712 Unilateral primary osteoarthritis, left knee: Secondary | ICD-10-CM | POA: Diagnosis present

## 2014-10-13 LAB — GLUCOSE, CAPILLARY
GLUCOSE-CAPILLARY: 190 mg/dL — AB (ref 65–99)
Glucose-Capillary: 227 mg/dL — ABNORMAL HIGH (ref 65–99)
Glucose-Capillary: 184 mg/dL — ABNORMAL HIGH (ref 65–99)

## 2014-10-13 MED ORDER — DIPHENHYDRAMINE HCL 25 MG PO CAPS
25.0000 mg | ORAL_CAPSULE | Freq: Once | ORAL | Status: AC
Start: 2014-10-13 — End: 2014-10-13
  Administered 2014-10-13: 25 mg via ORAL
  Filled 2014-10-13 (×2): qty 1

## 2014-10-13 NOTE — Care Management Note (Signed)
Case Management Note  Patient Details  Name: JASIRI HANAWALT MRN: [id number] Date of Birth: 27-Jun-1928  Subjective/Objective:               S/p left total knee arthroplasty     Action/Plan: PT/OT recommended SNF, referred to CSW, patient to discharge to [facility] SNF today per CSW.   Expected Discharge Date:                  Expected Discharge Plan:  Skilled Nursing Facility  In-House Referral:  Clinical Social Work  Discharge planning Services  NA  Post Acute Care Choice:    Choice offered to:  NA  DME Arranged:    DME Agency:     HH Arranged:    [place] Agency:     Status of Service:  Completed, signed off  Medicare Important Message Given:    Date Medicare IM Given:    Medicare IM give by:    Date Additional Medicare IM Given:    Additional Medicare Important Message give by:     If discussed at [place] of Stay Meetings, dates discussed:    Additional Comments:  Nila Nephew, RN 10/13/2014, 11:04 AM

## 2014-10-13 NOTE — Progress Notes (Signed)
     Subjective:  POD#2 L TKA. Patient reports pain as moderate.  Resting comfortably in bed.  Worked well with PT yesterday.  Expect that we will be able to get him to SNF today as long as PT sessions go well today.   Objective:   VITALS:   Filed Vitals:   10/12/14 1644 10/12/14 1645 10/12/14 2047 10/13/14 0543  BP:   118/53 114/56  Pulse:   77 82  Temp:   98.4 F (36.9 C) 98.5 F (36.9 C)  TempSrc:   Oral   Resp:   18 18  Height:      Weight:      SpO2: 90% 95% 94% 95%    Neurologically intact ABD soft Neurovascular intact Sensation intact distally Intact pulses distally Dorsiflexion/Plantar flexion intact Incision: dressing C/D/I   Lab Results  Component Value Date   WBC 9.4 09/29/2014   HGB 12.2* 09/29/2014   HCT 38.6* 09/29/2014   MCV 96.3 09/29/2014   PLT 203 09/29/2014   BMET    Component Value Date/Time   NA 137 09/29/2014 1333   NA 140 05/24/2014 1425   NA 140 03/09/2014   NA 144 04/12/2011 1634   K 5.2* 09/29/2014 1333   K 5.0 05/24/2014 1425   K 4.1 04/12/2011 1634   CL 100* 09/29/2014 1333   CL 104 04/12/2011 1634   CO2 30 09/29/2014 1333   CO2 27 05/24/2014 1425   CO2 31 04/12/2011 1634   GLUCOSE 110* 09/29/2014 1333   GLUCOSE 118 05/24/2014 1425   GLUCOSE 159* 04/12/2011 1634   BUN 15 09/29/2014 1333   BUN 17.6 05/24/2014 1425   BUN 24* 03/09/2014   BUN 16 04/12/2011 1634   CREATININE 0.74 09/29/2014 1333   CREATININE 0.9 05/24/2014 1425   CREATININE 0.8 03/09/2014   CREATININE 0.97 04/12/2011 1634   CALCIUM 9.3 09/29/2014 1333   CALCIUM 9.5 05/24/2014 1425   CALCIUM 8.9 04/12/2011 1634   GFRNONAA >60 09/29/2014 1333   GFRNONAA >60 04/12/2011 1634   GFRAA >60 09/29/2014 1333   GFRAA >60 04/12/2011 1634     Assessment/Plan: 2 Days Post-Op   Active Problems:   Arthritis of knee   Up with therapy WBAT in LLE Xarelto for DVT prophylaxis Plan for discharge to SNF today after sessions with PT   Taylor [name]  Marie 10/13/2014, 9:32 AM Cell [phone]

## 2014-10-13 NOTE — Progress Notes (Signed)
Report called to [facility]. Patient received by EMS and transported to [facility] with 2 bags of belongs.

## 2014-10-13 NOTE — Progress Notes (Signed)
Physical Therapy Treatment Patient Details Name: Taylor [name] MRN: [id number] DOB: 1928/08/02 Today's Date: 10/13/2014    History of Present Illness Lt TKA    PT Comments    Patient making gradual progress but still with limited mobility. Patient remains appropriate for D/C to SNF for further rehabilitation. Patient in agreement.   Follow Up Recommendations  SNF     Equipment Recommendations  None recommended by PT    Recommendations for Other Services       Precautions / Restrictions Precautions Precautions: Fall Restrictions Weight Bearing Restrictions: Yes LLE Weight Bearing: Weight bearing as tolerated    Mobility  Bed Mobility Overal bed mobility: Needs Assistance Bed Mobility: Sit to Supine     Supine to sit: Min guard     General bed mobility comments: cues for moving in bed, using trapeze to scoot over and up in bed  Transfers Overall transfer level: Needs assistance Equipment used: Rolling walker (2 wheeled) Transfers: Sit to/from Stand Sit to Stand: Mod assist         General transfer comment: needing assistance with stability when transitioning hand from chair to rw  Ambulation/Gait Ambulation/Gait assistance: Min assist Ambulation Distance (Feet): 6 Feet Assistive device: Rolling walker (2 wheeled) Gait Pattern/deviations: Step-to pattern Gait velocity: significantly decreased   General Gait Details: slow pattern   Stairs            Wheelchair Mobility    Modified Rankin (Stroke Patients Only)       Balance Overall balance assessment: Needs assistance Sitting-balance support: Single extremity supported Sitting balance-Leahy Scale: Poor     Standing balance support: Bilateral upper extremity supported Standing balance-Leahy Scale: Poor                      Cognition Arousal/Alertness: Awake/alert Behavior During Therapy: WFL for tasks assessed/performed Overall Cognitive Status: Within Functional Limits for  tasks assessed                      Exercises Total Joint Exercises Ankle Circles/Pumps: Both;10 reps;AROM Quad Sets: Left;Strengthening;10 reps Heel Slides: AAROM;Left;10 reps Hip ABduction/ADduction: Left;10 reps;Strengthening Straight Leg Raises: Left;10 reps;Other (comment) Goniometric ROM: 52 degrees flexion    General Comments        Pertinent Vitals/Pain Pain Assessment: 0-10 Pain Score: 7  Pain Location: Lt knee Pain Descriptors / Indicators: Aching Pain Intervention(s): Limited activity within patient's tolerance;Monitored during session    Home Living                      Prior Function            PT Goals (current goals can now be found in the care plan section) Acute Rehab PT Goals Patient Stated Goal: be able to eventually get back home. PT Goal Formulation: With patient Time For Goal Achievement: 10/26/14 Potential to Achieve Goals: Good Progress towards PT goals: Progressing toward goals    Frequency  7X/week    PT Plan Current plan remains appropriate    Co-evaluation             End of Session Equipment Utilized During Treatment: Gait belt;Oxygen Activity Tolerance: Patient limited by fatigue Patient left: in bed;in CPM;with call bell/phone within reach     Time: 1302-1336 PT Time Calculation (min) (ACUTE ONLY): 34 min  Charges:  $Gait Training: 8-22 mins $Therapeutic Exercise: 23-37 mins  G Codes:      Cassell Clement, PT, CSCS Pager [phone] Office [phone]  10/13/2014, 2:07 PM

## 2014-10-13 NOTE — Clinical Social Work Placement (Signed)
   CLINICAL SOCIAL WORK PLACEMENT  NOTE  Date:  10/13/2014  Patient Details  Name: Taylor [name] MRN: [id number] Date of Birth: 1928/06/25  Clinical Social Work is seeking post-discharge placement for this patient at the [place] level of care (*CSW will initial, date and re-position this form in  chart as items are completed):  Yes   Patient/family provided with [facility] Work Department's list of facilities offering this level of care within the geographic area requested by the patient (or if unable, by the patient's family).  Yes   Patient/family informed of their freedom to choose among providers that offer the needed level of care, that participate in Medicare, Medicaid or managed care program needed by the patient, have an available bed and are willing to accept the patient.  Yes   Patient/family informed of [facility]'s ownership interest in [facility] and [facility], as well as of the fact that they are under no obligation to receive care at these facilities.  PASRR submitted to EDS on 10/13/14     PASRR number received on 10/13/14     Existing PASRR number confirmed on  (n/a)     FL2 transmitted to all facilities in geographic area requested by pt/family on 10/13/14     FL2 transmitted to all facilities within larger geographic area on  (n/a)     Patient informed that his/her managed care company has contracts with or will negotiate with certain facilities, including the following:   (yes, [facility])     Yes   Patient/family informed of bed offers received.  Patient chooses bed at [facility]     Physician recommends and patient chooses bed at [facility]    Patient to be transferred to [facility] on 10/13/14.  Patient to be transferred to facility by [facility]     Patient family notified on 10/13/14 of transfer.  Name of family member notified:  Patient updated at bedside.     PHYSICIAN       Additional Comment:     _______________________________________________ Caroline Sauger, LCSW 10/13/2014, 1:00 PM

## 2014-10-13 NOTE — Progress Notes (Signed)
Physical Therapy Treatment Patient Details Name: Taylor [name] MRN: [id number] DOB: 1928-08-19 Today's Date: 10/13/2014    History of Present Illness Lt TKA    PT Comments    Patient making slow but gradual improvement with mobility. Based upon the patient's current mobility, SNF remains recommended D/C option.   Follow Up Recommendations  SNF     Equipment Recommendations  None recommended by PT    Recommendations for Other Services       Precautions / Restrictions Precautions Precautions: Fall Restrictions Weight Bearing Restrictions: Yes LLE Weight Bearing: Weight bearing as tolerated    Mobility  Bed Mobility Overal bed mobility: Needs Assistance Bed Mobility: Supine to Sit     Supine to sit: Mod assist;HOB elevated     General bed mobility comments: using rail with UEs  Transfers Overall transfer level: Needs assistance Equipment used: Rolling walker (2 wheeled) Transfers: Sit to/from Stand Sit to Stand: Mod assist         General transfer comment: primary assistance needed to assist with balance and weight shift when moving RUE to rw  Ambulation/Gait Ambulation/Gait assistance: Min assist Ambulation Distance (Feet): 5 Feet Assistive device: Rolling walker (2 wheeled) Gait Pattern/deviations: Step-to pattern;Decreased stance time - left;Decreased weight shift to left Gait velocity: significantly decreased   General Gait Details: slow pattern   Stairs            Wheelchair Mobility    Modified Rankin (Stroke Patients Only)       Balance Overall balance assessment: Needs assistance Sitting-balance support: Single extremity supported Sitting balance-Leahy Scale: Poor     Standing balance support: Bilateral upper extremity supported Standing balance-Leahy Scale: Poor                      Cognition Arousal/Alertness: Awake/alert Behavior During Therapy: WFL for tasks assessed/performed Overall Cognitive Status: Within  Functional Limits for tasks assessed                      Exercises Total Joint Exercises Ankle Circles/Pumps: Both;10 reps;AROM Quad Sets: Left;Strengthening;10 reps Heel Slides: AAROM;Left;10 reps Hip ABduction/ADduction: Left;10 reps;Strengthening (min-mod assist) Straight Leg Raises: Left;10 reps;Other (comment) (mod assist needed)    General Comments        Pertinent Vitals/Pain Pain Assessment: 0-10 Pain Score: 8  Pain Location: Lt knee Pain Descriptors / Indicators: Other (Comment) (pain) Pain Intervention(s): Monitored during session;Limited activity within patient's tolerance;RN gave pain meds during session    Home Living                      Prior Function            PT Goals (current goals can now be found in the care plan section) Acute Rehab PT Goals Patient Stated Goal: Have less pain PT Goal Formulation: With patient Time For Goal Achievement: 10/26/14 Potential to Achieve Goals: Good Progress towards PT goals: Progressing toward goals    Frequency  7X/week    PT Plan Current plan remains appropriate    Co-evaluation             End of Session Equipment Utilized During Treatment: Gait belt;Oxygen Activity Tolerance: Patient limited by fatigue Patient left: in chair;with nursing/sitter in room;Other (comment) (patient left in care of nursing)     Time: 1224-8250 PT Time Calculation (min) (ACUTE ONLY): 33 min  Charges:  $Gait Training: 8-22 mins $Therapeutic Exercise: 8-22 mins  G Codes:      Cassell Clement, PT, CSCS Pager [phone] Office [phone]  10/13/2014, 11:37 AM

## 2014-10-13 NOTE — Discharge Summary (Signed)
Physician Discharge Summary  Patient ID: ODIES DESA MRN: [id number] DOB/AGE: 10-Apr-1928 81 y.o.  Admit date: 10/11/2014 Discharge date: 10/13/2014  Admission Diagnoses:  Primary osteoarthritis of left knee  Discharge Diagnoses:  Principal Problem:   Primary osteoarthritis of left knee Active Problems:   Arthritis of knee   Past Medical History  Diagnosis Date  . Hypertension   . Hyperlipidemia   . Hypothyroidism   . COPD (chronic obstructive pulmonary disease)   . Atrial fibrillation   . Glaucoma   . Anxiety   . History of bladder cancer   . Lung nodule     SMALL  . OSA (obstructive sleep apnea)   . Heart murmur   . Dysrhythmia     Hx of Atrial Flutter  . Deep vein blood clot of left lower extremity   . On home oxygen therapy     At night, during the day PRN 2.5L  . Cancer     bladder cancer, skin cancer  . Diabetes mellitus without complication     Type 2  . Depression   . Kidney stones 2016  . Arthritis   . Osteoporosis     Surgeries: Procedure(s): TOTAL KNEE ARTHROPLASTY on 10/11/2014   Consultants (if any):    Discharged Condition: Improved  Hospital Course: JAYLYN BOOHER is an 81 y.o. male who was admitted 10/11/2014 with a diagnosis of Primary osteoarthritis of left knee and went to the operating room on 10/11/2014 and underwent the above named procedures.    He was given perioperative antibiotics:      Anti-infectives    Start     Dose/Rate Route Frequency Ordered Stop   10/11/14 1630  ceFAZolin (ANCEF) IVPB 2 g/50 mL premix     2 g 100 mL/hr over 30 Minutes Intravenous Every 6 hours 10/11/14 1458 10/11/14 2143   10/11/14 1030  vancomycin (VANCOCIN) IVPB 1000 mg/200 mL premix     1,000 mg 200 mL/hr over 60 Minutes Intravenous On call to O.R. 10/11/14 0902 10/12/14 0559   10/11/14 1030  ceFAZolin (ANCEF) IVPB 2 g/50 mL premix     2 g 100 mL/hr over 30 Minutes Intravenous On call to O.R. 10/11/14 0923 10/11/14 1037   10/11/14 0914  vancomycin  (VANCOCIN) 1 GM/200ML IVPB    Comments:  Ancil Boozer  : cabinet override      10/11/14 0914 10/11/14 1030    .  He was given sequential compression devices, early ambulation, and Xarelto '10mg'$  for DVT prophylaxis.  He benefited maximally from the hospital stay and there were no complications.    Recent vital signs:  Filed Vitals:   10/13/14 0543  BP: 114/56  Pulse: 82  Temp: 98.5 F (36.9 C)  Resp: 18    Recent laboratory studies:  Lab Results  Component Value Date   HGB 12.2* 09/29/2014   HGB 12.5* 08/09/2014   HGB 13.5 06/30/2014   Lab Results  Component Value Date   WBC 9.4 09/29/2014   PLT 203 09/29/2014   Lab Results  Component Value Date   INR 0.9 12/14/2007   Lab Results  Component Value Date   NA 137 09/29/2014   K 5.2* 09/29/2014   CL 100* 09/29/2014   CO2 30 09/29/2014   BUN 15 09/29/2014   CREATININE 0.74 09/29/2014   GLUCOSE 110* 09/29/2014    Discharge Medications:     Medication List    STOP taking these medications        acetaminophen 500  MG tablet  Commonly known as:  TYLENOL     oxyCODONE-acetaminophen 7.5-325 MG per tablet  Commonly known as:  PERCOCET  Replaced by:  oxyCODONE-acetaminophen 5-325 MG per tablet      TAKE these medications        albuterol 108 (90 BASE) MCG/ACT inhaler  Commonly known as:  PROVENTIL HFA;VENTOLIN HFA  Inhale 2 puffs into the lungs every 4 (four) hours as needed for wheezing or shortness of breath.     albuterol (2.5 MG/3ML) 0.083% nebulizer solution  Commonly known as:  PROVENTIL  Take 2.5 mg by nebulization every 6 (six) hours as needed for shortness of breath.     alendronate 70 MG tablet  Commonly known as:  FOSAMAX  Take 70 mg by mouth once a week. Monday     ALPRAZolam 0.5 MG tablet  Commonly known as:  XANAX  Take 0.25-0.5 mg by mouth at bedtime as needed for anxiety.     aspirin 81 MG tablet  Take 81 mg by mouth daily.     atorvastatin 20 MG tablet  Commonly known as:   LIPITOR  Take 10 mg by mouth daily.     CALCIUM + D PO  Take 2 tablets by mouth daily.     diltiazem 180 MG 24 hr capsule  Commonly known as:  CARDIZEM CD  Take 180 mg by mouth daily.     DULoxetine 30 MG capsule  Commonly known as:  CYMBALTA  Take 30 mg by mouth every evening.     fluticasone 50 MCG/ACT nasal spray  Commonly known as:  FLONASE  Place 2 sprays into the nose daily.     gabapentin 100 MG capsule  Commonly known as:  NEURONTIN  Take 100 mg by mouth at bedtime.     ketoconazole 2 % cream  Commonly known as:  NIZORAL  Apply 2 application topically as needed. rash     latanoprost 0.005 % ophthalmic solution  Commonly known as:  XALATAN  Place 1 drop into both eyes every evening.     levothyroxine 100 MCG tablet  Commonly known as:  SYNTHROID, LEVOTHROID  Take 50-100 mcg by mouth daily before breakfast. Takes 31mg on sun and mon Takes 1039m all other days     metFORMIN 500 MG tablet  Commonly known as:  GLUCOPHAGE  Take 500 mg by mouth 2 (two) times daily with a meal.     ondansetron 4 MG tablet  Commonly known as:  ZOFRAN  Take 1 tablet (4 mg total) by mouth every 8 (eight) hours as needed for nausea or vomiting.     oxyCODONE-acetaminophen 5-325 MG per tablet  Commonly known as:  PERCOCET  Take 1-2 tablets by mouth every 4 (four) hours as needed for severe pain.     OXYGEN  Inhale 2.5 L into the lungs at bedtime. Only use oxygen at night     polyethylene glycol packet  Commonly known as:  MIRALAX / GLYCOLAX  Take 17 g by mouth daily.     rivaroxaban 10 MG Tabs tablet  Commonly known as:  XARELTO  Take 1 tablet (10 mg total) by mouth daily.     tiZANidine 4 MG tablet  Commonly known as:  ZANAFLEX  Take 1 tablet (4 mg total) by mouth every 6 (six) hours as needed for muscle spasms.     valsartan 80 MG tablet  Commonly known as:  DIOVAN  Take 80 mg by mouth daily.  Diagnostic Studies: Dg Knee Left Port  10/11/2014   CLINICAL DATA:   Postop left total knee arthroplasty for osteoarthritis.  EXAM: PORTABLE LEFT KNEE - 1-2 VIEW  COMPARISON:  03/24/2014.  FINDINGS: Portable AP and cross-table lateral images were obtained. Anatomic alignment post left total knee arthroplasty. No acute complicating features. Gas in the soft tissues. Fluid in the joint space, as expected. Osseous demineralization and sarcopenia again noted.  IMPRESSION: Anatomic alignment post left total knee arthroplasty without acute complicating features.   Electronically Signed   By: Evangeline Dakin M.D.   On: 10/11/2014 15:04    Disposition: 01-Home or Self Care  Discharge Instructions    Weight bearing as tolerated    Complete by:  As directed   Laterality:  left  Extremity:  Lower           Follow-up Information    Follow up with MURPHY, TIMOTHY D, MD In 1 week.   Specialty:  Orthopedic Surgery   Contact information:   [address] [place] [address] [phone]        Signed: Gae Dry 10/13/2014, 9:36 AM Cell [phone]

## 2014-10-13 NOTE — Clinical Social Work Note (Signed)
Patient to be discharged to [facility]. Patient updated at bedside.  Facility: [facility] RN report number: [phone] Transportation: EMS [phone])  Barrier to discharge: MD to sign FL2 located on patient's chart.  RN to call for EMS transportation once [facility] signed. EMS transportation: [phone]

## 2014-10-13 NOTE — Clinical Social Work Note (Signed)
Clinical Social Work Assessment  Patient Details  Name: Taylor [name] MRN: [id number] Date of Birth: Feb 20, 1928  Date of referral:  10/13/14               Reason for consult:  Facility Placement, Discharge Planning                Permission sought to share information with:  Chartered certified accountant granted to share information::  Yes, Verbal Permission Granted  Name::        Agency::  [employer] (pre-registered)  Relationship::     Contact Information:     Housing/Transportation Living arrangements for the past 2 months:  Single Family Home Source of Information:  Patient Patient Interpreter Needed:  None Criminal Activity/Legal Involvement Pertinent to Current Situation/Hospitalization:  No - Comment as needed Significant Relationships:  Adult Children Lives with:  Adult Children Do you feel safe going back to the place where you live?  No (High fall risk.) Need for family participation in patient care:  No (Coment) (Patient able to make own decisions.)  Care giving concerns:  Patient expressed no concerns at this time.   Social Worker assessment / plan:  CSW received referral for possible SNF placement at time of discharge. CSW met with patient to discuss discharge disposition. Per patient, patient has pre-registered with [facility] for placement at time of discharge. CSW to continue to follow and assist with discharge planning needs.  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  Patient/Family's Understanding of and Emotional Response to Diagnosis, Current Treatment, and Prognosis:  Patient understanding and agreeable to CSW plan of care.  Emotional Assessment Appearance:  Appears  stated age Attitude/Demeanor/Rapport:  Other (Pleasant.) Affect (typically observed):  Pleasant, Accepting, Appropriate Orientation:  Oriented to Self, Oriented to Place, Oriented to  Time, Oriented to Situation Alcohol / Substance use:  Not Applicable Psych involvement (Current and /or in the community):  No (Comment) (Not appropriate on this admission.)  Discharge Needs  Concerns to be addressed:  No discharge needs identified Readmission within the last 30 days:  No Current discharge risk:  None Barriers to Discharge:  No Barriers Identified   Caroline Sauger, LCSW 10/13/2014, 12:58 PM [phone]

## 2014-10-18 ENCOUNTER — Non-Acute Institutional Stay (SKILLED_NURSING_FACILITY): Payer: Medicare Other | Admitting: Internal Medicine

## 2014-10-18 DIAGNOSIS — E1143 Type 2 diabetes mellitus with diabetic autonomic (poly)neuropathy: Secondary | ICD-10-CM

## 2014-10-18 DIAGNOSIS — Z8679 Personal history of other diseases of the circulatory system: Secondary | ICD-10-CM

## 2014-10-18 DIAGNOSIS — D62 Acute posthemorrhagic anemia: Secondary | ICD-10-CM

## 2014-10-18 DIAGNOSIS — M1712 Unilateral primary osteoarthritis, left knee: Secondary | ICD-10-CM

## 2014-10-18 DIAGNOSIS — J432 Centrilobular emphysema: Secondary | ICD-10-CM | POA: Diagnosis not present

## 2014-10-18 DIAGNOSIS — K5901 Slow transit constipation: Secondary | ICD-10-CM

## 2014-10-18 DIAGNOSIS — E785 Hyperlipidemia, unspecified: Secondary | ICD-10-CM | POA: Diagnosis not present

## 2014-10-18 DIAGNOSIS — I10 Essential (primary) hypertension: Secondary | ICD-10-CM

## 2014-10-18 DIAGNOSIS — E039 Hypothyroidism, unspecified: Secondary | ICD-10-CM

## 2014-10-18 DIAGNOSIS — M810 Age-related osteoporosis without current pathological fracture: Secondary | ICD-10-CM | POA: Diagnosis not present

## 2014-10-18 DIAGNOSIS — R2681 Unsteadiness on feet: Secondary | ICD-10-CM | POA: Diagnosis not present

## 2014-10-18 DIAGNOSIS — E875 Hyperkalemia: Secondary | ICD-10-CM | POA: Diagnosis not present

## 2014-10-18 DIAGNOSIS — L03116 Cellulitis of left lower limb: Secondary | ICD-10-CM

## 2014-10-18 LAB — BASIC METABOLIC PANEL
BUN: 21 mg/dL (ref 4–21)
Creatinine: 0.9 mg/dL (ref 0.6–1.3)
GLUCOSE: 105 mg/dL
Potassium: 5.1 mmol/L (ref 3.4–5.3)
Sodium: 142 mmol/L (ref 137–147)

## 2014-10-18 LAB — HEPATIC FUNCTION PANEL
ALT: 5 U/L — AB (ref 10–40)
AST: 9 U/L — AB (ref 14–40)
Alkaline Phosphatase: 53 U/L (ref 25–125)
BILIRUBIN, TOTAL: 0.6 mg/dL

## 2014-10-18 NOTE — Progress Notes (Addendum)
Patient ID: Taylor [name], male   DOB: [birth year], 81 y.o.   MRN: [id number]    Facility: [facility]    PCP: [name], MD  Code Status: DNR  Allergies  Allergen Reactions  . Amoxicillin Palpitations  . Motrin [Ibuprofen] Palpitations  . Chantix [Varenicline Tartrate] Other (See Comments)    depression    Chief Complaint  Patient presents with  . New Admit To SNF     HPI:  81 year old patient is here for short term rehabilitation post hospital admission from 10/11/14-10/13/14 with primary OA of left knee. He underwent total knee arthroplasty. He has PMH of HTN, HLD, COPD, hypothyroidism, atrial fibrillation, glaucoma, anxiety, lumbar spinal stenosis, bladder cancer among others. He is seen in his room today. He is having icecream. His pain is under control with current pain regimen. Occasional muscle spasm present helped by muscle relaxant. Bowel movement is regular. Denies any other concern  Review of Systems:  Constitutional: Negative for fever, chills, diaphoresis.  HENT: Negative for headache, congestion, nasal discharge Eyes: Negative for eye pain, blurred vision, double vision and discharge.  Respiratory: Negative for cough, shortness of breath and wheezing.  on o2 Cardiovascular: Negative for chest pain, palpitations. has leg swelling.  Gastrointestinal: Negative for heartburn, nausea, vomiting, abdominal pain. Bowel movement this am Genitourinary: Negative for dysuria Musculoskeletal: Negative for falls Skin: Negative for itching, rash.  Neurological: Negative for dizziness Psychiatric/Behavioral: Negative for depression   Past Medical History  Diagnosis Date  . Hypertension   . Hyperlipidemia   . Hypothyroidism   . COPD (chronic obstructive pulmonary disease)   . Atrial fibrillation   . Glaucoma   . Anxiety   . History of bladder cancer   . Lung nodule     SMALL  . OSA (obstructive sleep apnea)   . Heart murmur   .  Dysrhythmia     Hx of Atrial Flutter  . Deep vein blood clot of left lower extremity   . On home oxygen therapy     At night, during the day PRN 2.5L  . Cancer     bladder cancer, skin cancer  . Diabetes mellitus without complication     Type 2  . Depression   . Kidney stones 2016  . Arthritis   . Osteoporosis    Past Surgical History  Procedure Laterality Date  . Cardiovascular stress test  12/25/2007    EF 64%. NO EVIDENCE OF ISCHEMIA  . Cystoscopy N/A 12/15  . Varicose vein surgery      at 81 years old first, and about 20 years ago  . Vascular surgery    . Back surgery  10-18-11    [facility]  . Tonsillectomy    . Eye surgery Bilateral     cataracts  . Multiple tooth extractions    . Total knee arthroplasty Left 10/11/2014    Procedure: TOTAL KNEE ARTHROPLASTY;  Surgeon: Renette Butters, MD;  Location: [facility];  Service: Orthopedics;  Laterality: Left;   Social History:   reports that he quit smoking about 3 years ago. His smoking use included Cigarettes. He has a 90 pack-year smoking history. He has never used smokeless tobacco. He reports that he does not drink alcohol or use illicit drugs.  Family History  Problem Relation Age of Onset  . Cancer - Lung Mother     23  . Heart attack Mother   . Heart Problems Father   . Diabetes  Brother   . Alcoholism Brother   . Other Daughter     Beth[place]  . Other Other     HALF SISTER [facility]    Medications: Patient's Medications  New Prescriptions   No medications on file  Previous Medications   ALBUTEROL (PROVENTIL HFA;VENTOLIN HFA) 108 (90 BASE) MCG/ACT INHALER    Inhale 2 puffs into the lungs every 4 (four) hours as needed for wheezing or shortness of breath.   ALBUTEROL (PROVENTIL) (2.5 MG/3ML) 0.083% NEBULIZER SOLUTION    Take 2.5 mg by nebulization every 6 (six) hours as needed for shortness of breath.    ALENDRONATE (FOSAMAX) 70 MG TABLET    Take 70 mg by mouth once a week. Monday   ALPRAZOLAM  (XANAX) 0.5 MG TABLET    Take 0.25-0.5 mg by mouth at bedtime as needed for anxiety.    ASPIRIN 81 MG TABLET    Take 81 mg by mouth daily.     ATORVASTATIN (LIPITOR) 20 MG TABLET    Take 10 mg by mouth daily.   CALCIUM CARBONATE-VITAMIN D (CALCIUM + D PO)    Take 2 tablets by mouth daily.   DILTIAZEM (CARDIZEM CD) 180 MG 24 HR CAPSULE    Take 180 mg by mouth daily.   DULOXETINE (CYMBALTA) 30 MG CAPSULE    Take 30 mg by mouth every evening.    FLUTICASONE (FLONASE) 50 MCG/ACT NASAL SPRAY    Place 2 sprays into the nose daily.     GABAPENTIN (NEURONTIN) 100 MG CAPSULE    Take 100 mg by mouth at bedtime.   KETOCONAZOLE (NIZORAL) 2 % CREAM    Apply 2 application topically as needed. rash   LATANOPROST (XALATAN) 0.005 % OPHTHALMIC SOLUTION    Place 1 drop into both eyes every evening.    LEVOTHYROXINE (SYNTHROID, LEVOTHROID) 100 MCG TABLET    Take 50-100 mcg by mouth daily before breakfast. Takes 52mg on sun and mon Takes 1021m all other days   METFORMIN (GLUCOPHAGE) 500 MG TABLET    Take 500 mg by mouth 2 (two) times daily with a meal.    ONDANSETRON (ZOFRAN) 4 MG TABLET    Take 1 tablet (4 mg total) by mouth every 8 (eight) hours as needed for nausea or vomiting.   OXYCODONE-ACETAMINOPHEN (PERCOCET) 5-325 MG PER TABLET    Take 1-2 tablets by mouth every 4 (four) hours as needed for severe pain.   OXYGEN    Inhale 2.5 L into the lungs at bedtime. Only use oxygen at night   POLYETHYLENE GLYCOL (MIRALAX / GLYCOLAX) PACKET    Take 17 g by mouth daily.     RIVAROXABAN (XARELTO) 10 MG TABS TABLET    Take 1 tablet (10 mg total) by mouth daily.   TIZANIDINE (ZANAFLEX) 4 MG TABLET    Take 1 tablet (4 mg total) by mouth every 6 (six) hours as needed for muscle spasms.   VALSARTAN (DIOVAN) 80 MG TABLET    Take 80 mg by mouth daily.  Modified Medications   No medications on file  Discontinued Medications   No medications on file     Physical Exam: Filed Vitals:   10/18/14 1041  BP: 146/78  Pulse:  92  Temp: 97.7 F (36.5 C)  Resp: 18  SpO2: 98%    General- elderly obese male, in no acute distress Head- normocephalic, atraumatic Throat- moist mucus membrane Eyes- PERRLA, EOMI, no pallor, no icterus, no discharge, normal conjunctiva, normal sclera Neck- no  cervical lymphadenopathy Cardiovascular- normal s1,s2, no murmurs, palpable dorsalis pedis and radial pulses, 1+ left and trace right leg edema Respiratory- bilateral clear to auscultation, no wheeze, no rhonchi, no crackles, no use of accessory muscles, on o2 by [place] Abdomen- bowel sounds present, soft, non tender Musculoskeletal- able to move all 4 extremities with generalized weakness but limited ROM to left leg. swelling around surgical area with erythema and warmth noted. aquacel dressing to left knee with some staining noted Neurological- no focal deficit Psychiatry- alert and oriented to person, place and time, normal mood and affect   Labs reviewed: Basic Metabolic Panel:  Recent Labs  06/30/14 1035 08/09/14 2305 09/29/14 1333  NA 140 137 137  K 4.5 4.3 5.2*  CL 103 102 100*  CO2 '28 26 30  '$ GLUCOSE 115* 134* 110*  BUN 21* 17 15  CREATININE 0.75 0.85 0.74  CALCIUM 9.6 9.3 9.3   Liver Function Tests:  Recent Labs  03/03/14 0545 05/24/14 1425  AST 16 12  ALT 13 8  ALKPHOS 71 70  BILITOT 0.6 0.41  PROT 7.2 7.5  ALBUMIN 3.5 3.6   No results for input(s): LIPASE, AMYLASE in the last 8760 hours. No results for input(s): AMMONIA in the last 8760 hours. CBC:  Recent Labs  03/02/14 1411  05/24/14 1424 06/30/14 1035 08/09/14 2305 09/29/14 1333  WBC 12.4*  < > 9.7 10.2 11.9* 9.4  NEUTROABS 8.7*  --  6.3 7.3  --   --   HGB 12.8*  < > 12.1* 13.5 12.5* 12.2*  HCT 40.0  < > 38.5 42.2 39.3 38.6*  MCV 97.8  < > 98.7* 95.7 93.8 96.3  PLT 213  < > 212 214 214 203  < > = values in this interval not displayed. Cardiac Enzymes: No results for input(s): CKTOTAL, CKMB, CKMBINDEX, TROPONINI in the last 8760  hours. BNP: Invalid input(s): POCBNP CBG:  Recent Labs  10/12/14 2228 10/13/14 0643 10/13/14 1220  GLUCAP 227* 190* 184*     Assessment/Plan  Unsteady gait With OA and recent surgical repair. Will have him work with physical therapy and occupational therapy team to help with gait training and muscle strengthening exercises.fall precautions. Skin care. Encourage to be out of bed.   Left knee OA S/p left TKA. Continue percocet 5-325 mg 1-2 tab q4h prn pain and zanaflex 4 mg q6h prn muscle spasm. Has f/u with orthopedics. Continue xarelto for dvt prophylaxis. To work with therapy team  Left knee cellulitis Start doxycycline 100 mg bid for a week, will need him to be seen by orthopedic for removal of dressing and incision assessment ASAP. Monitor wbc and temperature curve  HTN Elevated bp reading. Currently on diltiazem '180mg'$  daily with losartan '50mg'$  daily. D/c losartan and have pt on valsartan 80 mg daily, check bp daily and adjust bp medication if needed  Acute blood loss anemia Post op, monitor h&h  Hyperkalemia Monitor bmp  Constipation continue miralax daily and hydration encouraged  type 2 diabetes mellitus Lab Results  Component Value Date   HGBA1C 6.1* 09/29/2014  continue metformin '500mg'$  twice daily with gabapentin '100mg'$  at bedtime for neuropathic pain. Monitor cbg bid  COPD Breathing stable, continue o2 and prn albuterol  Osteoporosis Continue fosamax once a week and calcium with vitamin d   Atrial flutter Rate controlled.continue diltiazem '180mg'$  daily and aspirin 81 mg daily  HLD Continue lipitor 10 mg daily  Hypothyroidism Continue levothyroxine 31mg on Sunday and Monday and 1054m daily on all other  days. Continue to monitor   Goals of care: short term rehabilitation   Labs/tests ordered: cbc with diff, cmp  Family/ staff Communication: reviewed care plan with patient and nursing supervisor    Blanchie Serve, MD  [facility] [phone] (Monday-Friday 8 am - 5 pm) [phone] (afterhours)

## 2014-10-24 LAB — CBC AND DIFFERENTIAL
HEMATOCRIT: 35 % — AB (ref 41–53)
Hemoglobin: 10.3 g/dL — AB (ref 13.5–17.5)
PLATELETS: 266 10*3/uL (ref 150–399)
WBC: 11.1 10*3/mL

## 2014-10-27 ENCOUNTER — Encounter: Payer: Self-pay | Admitting: Internal Medicine

## 2014-10-27 ENCOUNTER — Non-Acute Institutional Stay (SKILLED_NURSING_FACILITY): Payer: Medicare Other | Admitting: Internal Medicine

## 2014-10-27 DIAGNOSIS — I959 Hypotension, unspecified: Secondary | ICD-10-CM

## 2014-10-27 NOTE — Progress Notes (Signed)
Patient ID: Taylor [name], male   DOB: 1928/05/30, 81 y.o.   MRN: [id number]      [facility]  Code Status: DNR   Chief Complaint  Patient presents with  . Acute Visit    Low blood pressure     Allergies  Allergen Reactions  . Amoxicillin Palpitations  . Motrin [Ibuprofen] Palpitations  . Chantix [Varenicline Tartrate] Other (See Comments)    depression    HPI:  81 year old patient is seen with acute concerns from the staff. His blood pressure has been running low and on review 95-104/44-61. He is seen in his room today. Denies any dizziness or lightheadedness. Denies any headache, chest pain pr worsening dyspnea. He is here for short term rehabilitation post hospital admission with primary OA of left knee and is status post total knee arthroplasty. He has PMH of HTN, HLD, COPD, hypothyroidism, atrial fibrillation, glaucoma, anxiety, lumbar spinal stenosis, bladder cancer among others.   Review of Systems:  Constitutional: Negative for fever, chills, diaphoresis.  HENT: Negative for headache, congestion, nasal discharge Eyes: Negative for eye pain, blurred vision, double vision and discharge.  Respiratory: Negative for cough, shortness of breath and wheezing.  on o2 Cardiovascular: Negative for chest pain, palpitations. has leg swelling.  Gastrointestinal: Negative for heartburn, nausea, vomiting, abdominal pain.  Musculoskeletal: Negative for falls Skin: Negative for itching, rash.  Neurological: Negative for dizziness Psychiatric/Behavioral: Negative for depression  Past Medical History  Diagnosis Date  . Hypertension   . Hyperlipidemia   . Hypothyroidism   . COPD (chronic obstructive pulmonary disease)   . Atrial fibrillation   . Glaucoma   . Anxiety   . History of bladder cancer   . Lung nodule     SMALL  . OSA (obstructive sleep apnea)   . Heart murmur   . Dysrhythmia     Hx of Atrial Flutter  . Deep vein blood clot of left lower extremity    . On home oxygen therapy     At night, during the day PRN 2.5L  . Cancer     bladder cancer, skin cancer  . Diabetes mellitus without complication     Type 2  . Depression   . Kidney stones 2016  . Arthritis   . Osteoporosis       Medication List       This list is accurate as of: 10/27/14  3:03 PM.  Always use your most recent med list.               albuterol 108 (90 BASE) MCG/ACT inhaler  Commonly known as:  PROVENTIL HFA;VENTOLIN HFA  Inhale 2 puffs into the lungs every 4 (four) hours as needed for wheezing or shortness of breath.     albuterol (2.5 MG/3ML) 0.083% nebulizer solution  Commonly known as:  PROVENTIL  Take 2.5 mg by nebulization every 6 (six) hours as needed for shortness of breath.     alendronate 70 MG tablet  Commonly known as:  FOSAMAX  Take 70 mg by mouth once a week. Monday     ALPRAZolam 0.5 MG tablet  Commonly known as:  XANAX  0.25 mg(1/2 tablet) at bedtime as needed     atorvastatin 10 MG tablet  Commonly known as:  LIPITOR  Take 10 mg by mouth daily.     CALCIUM + D PO  Take 2 tablets by mouth daily.     cephALEXin 500 MG capsule  Commonly known as:  KEFLEX  Take 500 mg by mouth 3 (three) times daily. X 14 days starting 10/19/14, ending 11/02/14     diltiazem 180 MG 24 hr capsule  Commonly known as:  CARDIZEM CD  Take 180 mg by mouth daily.     DULoxetine 30 MG capsule  Commonly known as:  CYMBALTA  Take 30 mg by mouth every evening.     ferrous sulfate 325 (65 FE) MG EC tablet  Take 325 mg by mouth 2 (two) times daily.     FLORASTOR 250 MG capsule  Generic drug:  saccharomyces boulardii  Take 250 mg by mouth 2 (two) times daily. X 2 weeks, ending on 11/01/14     fluticasone 50 MCG/ACT nasal spray  Commonly known as:  FLONASE  Place 2 sprays into the nose daily.     gabapentin 100 MG capsule  Commonly known as:  NEURONTIN  Take 100 mg by mouth at bedtime.     ketoconazole 2 % cream  Commonly known as:  NIZORAL  Apply  2 application topically as needed. rash     latanoprost 0.005 % ophthalmic solution  Commonly known as:  XALATAN  Place 1 drop into both eyes every evening.     levothyroxine 100 MCG tablet  Commonly known as:  SYNTHROID, LEVOTHROID  Take 50-100 mcg by mouth daily before breakfast. Takes 40mg on sun and mon Takes 1055m all other days     metFORMIN 500 MG tablet  Commonly known as:  GLUCOPHAGE  Take 500 mg by mouth 2 (two) times daily with a meal.     ondansetron 4 MG tablet  Commonly known as:  ZOFRAN  Take 1 tablet (4 mg total) by mouth every 8 (eight) hours as needed for nausea or vomiting.     oxyCODONE-acetaminophen 5-325 MG per tablet  Commonly known as:  PERCOCET  Take 1-2 tablets by mouth every 4 (four) hours as needed for severe pain.     OXYGEN  Inhale 2.5 L into the lungs at bedtime. Only use oxygen at night     polyethylene glycol packet  Commonly known as:  MIRALAX / GLYCOLAX  Take 17 g by mouth daily.     rivaroxaban 10 MG Tabs tablet  Commonly known as:  XARELTO  Take 1 tablet (10 mg total) by mouth daily.     tiZANidine 4 MG tablet  Commonly known as:  ZANAFLEX  Take 1 tablet (4 mg total) by mouth every 6 (six) hours as needed for muscle spasms.     valsartan 80 MG tablet  Commonly known as:  DIOVAN  Take 80 mg by mouth daily.        Physical exam BP 98/44 mmHg  Pulse 71  Temp(Src) 97.8 F (36.6 C) (Oral)  Resp 16  SpO2 96%  General- elderly obese male, in no acute distress Head- normocephalic, atraumatic Throat- moist mucus membrane Eyes- PERRLA, EOMI, no pallor, no icterus, no discharge, normal conjunctiva, normal sclera Neck- no cervical lymphadenopathy Cardiovascular- normal s1,s2, no murmurs, palpable dorsalis pedis and radial pulses, trace leg edema Respiratory- bilateral clear to auscultation, no wheeze, no rhonchi, no crackles, no use of accessory muscles, on o2 by [place] Abdomen- bowel sounds present, soft, non tender Musculoskeletal-  able to move all 4 extremities with generalized weakness but limited ROM to left leg Neurological- no focal deficit Psychiatry- alert and oriented to person, place and time, normal mood and affect  Labs CBC Latest Ref Rng 10/24/2014 09/29/2014 08/09/2014  WBC -  11.1 9.4 11.9(H)  Hemoglobin 13.5 - 17.5 g/dL 10.3(A) 12.2(L) 12.5(L)  Hematocrit 41 - 53 % 35(A) 38.6(L) 39.3  Platelets 150 - 399 K/L 266 203 214    Assessment/plan  Hypotension Asymptomatic. Likely iatrogenic with him being on cardizem, valsartan, percocet, zanaflex and prn xanax. D/c valsartan for now and check bp q shift, also rule out orthostatic hypotension. If bp remains low consider spacing out his muscle relaxant and pain regimen. If orthostatic will check cbc and cmp further. Currently appears euvolemic.   Blanchie Serve, MD  [facility] [phone] (Monday-Friday 8 am - 5 pm) [phone] (afterhours)

## 2014-10-28 ENCOUNTER — Other Ambulatory Visit: Payer: Self-pay | Admitting: *Deleted

## 2014-10-28 MED ORDER — OXYCODONE-ACETAMINOPHEN 5-325 MG PO TABS: ORAL_TABLET | ORAL | Status: DC

## 2014-10-28 MED ORDER — ALPRAZOLAM 0.25 MG PO TABS
ORAL_TABLET | ORAL | Status: DC
Start: 2014-10-28 — End: 2015-02-24

## 2014-10-28 NOTE — Telephone Encounter (Signed)
[facility] 

## 2014-11-07 ENCOUNTER — Non-Acute Institutional Stay (SKILLED_NURSING_FACILITY): Payer: Medicare Other | Admitting: Nurse Practitioner

## 2014-11-07 DIAGNOSIS — E1143 Type 2 diabetes mellitus with diabetic autonomic (poly)neuropathy: Secondary | ICD-10-CM | POA: Diagnosis not present

## 2014-11-07 DIAGNOSIS — M1712 Unilateral primary osteoarthritis, left knee: Secondary | ICD-10-CM | POA: Diagnosis not present

## 2014-11-07 DIAGNOSIS — E039 Hypothyroidism, unspecified: Secondary | ICD-10-CM

## 2014-11-07 DIAGNOSIS — I10 Essential (primary) hypertension: Secondary | ICD-10-CM

## 2014-11-07 DIAGNOSIS — D62 Acute posthemorrhagic anemia: Secondary | ICD-10-CM | POA: Diagnosis not present

## 2014-11-07 DIAGNOSIS — L03116 Cellulitis of left lower limb: Secondary | ICD-10-CM

## 2014-11-07 DIAGNOSIS — J432 Centrilobular emphysema: Secondary | ICD-10-CM

## 2014-11-07 NOTE — Progress Notes (Signed)
Patient ID: Taylor [name], male   DOB: 02/10/1928, 81 y.o.   MRN: [id number]    Nursing Home Location:  [place] of Service: SNF (31)  PCP: [name], MD  Allergies  Allergen Reactions  . Amoxicillin Palpitations  . Motrin [Ibuprofen] Palpitations  . Chantix [Varenicline Tartrate] Other (See Comments)    depression    Chief Complaint  Patient presents with  . Discharge Note    HPI:  Patient is a 81 y.o. male seen today at [facility] for discharge home. He has PMH of HTN, HLD, COPD, hypothyroidism, atrial fibrillation, glaucoma, anxiety, lumbar spinal stenosis, bladder cancer. Pt is here for short term rehabilitation post hospital admission from 10/11/14-10/13/14 with primary OA of left knee. He underwent total knee arthroplasty. Pain is well controlled on current regimen. Bowels moving well. Patient currently doing well with therapy, now stable to discharge home with home health.  Review of Systems:  Review of Systems  Constitutional: Negative for activity change, appetite change, fatigue and unexpected weight change.  HENT: Negative for congestion and hearing loss.   Eyes: Negative.   Respiratory: Negative for cough and shortness of breath.   Cardiovascular: Negative for chest pain, palpitations and leg swelling.  Gastrointestinal: Negative for abdominal pain, diarrhea and constipation.  Genitourinary: Negative for dysuria and difficulty urinating.  Musculoskeletal: Negative for myalgias and arthralgias.       Pain controlled  Skin: Negative for color change and wound.  Neurological: Negative for dizziness and weakness.  Psychiatric/Behavioral: Negative for behavioral problems, confusion and agitation.    Past Medical History  Diagnosis Date  . Hypertension   . Hyperlipidemia   . Hypothyroidism   . COPD (chronic obstructive pulmonary disease)   . Atrial fibrillation   . Glaucoma   . Anxiety   . History of bladder  cancer   . Lung nodule     SMALL  . OSA (obstructive sleep apnea)   . Heart murmur   . Dysrhythmia     Hx of Atrial Flutter  . Deep vein blood clot of left lower extremity   . On home oxygen therapy     At night, during the day PRN 2.5L  . Cancer     bladder cancer, skin cancer  . Diabetes mellitus without complication     Type 2  . Depression   . Kidney stones 2016  . Arthritis   . Osteoporosis    Past Surgical History  Procedure Laterality Date  . Cardiovascular stress test  12/25/2007    EF 64%. NO EVIDENCE OF ISCHEMIA  . Cystoscopy N/A 12/15  . Varicose vein surgery      at 81 years old first, and about 20 years ago  . Vascular surgery    . Back surgery  10-18-11    [facility]  . Tonsillectomy    . Eye surgery Bilateral     cataracts  . Multiple tooth extractions    . Total knee arthroplasty Left 10/11/2014    Procedure: TOTAL KNEE ARTHROPLASTY;  Surgeon: Renette Butters, MD;  Location: [place];  Service: Orthopedics;  Laterality: Left;   Social History:   reports that he quit smoking about 3 years ago. His smoking use included Cigarettes. He has a 90 pack-year smoking history. He has never used smokeless tobacco. He reports that he does not drink alcohol or use illicit drugs.  Family History  Problem Relation Age of Onset  .  Cancer - Lung Mother     51  . Heart attack Mother   . Heart Problems Father   . Diabetes Brother   . Alcoholism Brother   . Other Daughter     [place]  . Other Other     HALF SISTER [facility]    Medications: Patient's Medications  New Prescriptions   No medications on file  Previous Medications   ALBUTEROL (PROVENTIL HFA;VENTOLIN HFA) 108 (90 BASE) MCG/ACT INHALER    Inhale 2 puffs into the lungs every 4 (four) hours as needed for wheezing or shortness of breath.   ALBUTEROL (PROVENTIL) (2.5 MG/3ML) 0.083% NEBULIZER SOLUTION    Take 2.5 mg by nebulization every 6 (six) hours as needed for shortness of breath.     ALENDRONATE (FOSAMAX) 70 MG TABLET    Take 70 mg by mouth once a week. Monday   ALPRAZOLAM (XANAX) 0.25 MG TABLET    Take one tablet by mouth every night at bedtime as needed for anxiety   ALPRAZOLAM (XANAX) 0.5 MG TABLET    0.25 mg(1/2 tablet) at bedtime as needed   ATORVASTATIN (LIPITOR) 10 MG TABLET    Take 10 mg by mouth daily.   CALCIUM CARBONATE-VITAMIN D (CALCIUM + D PO)    Take 2 tablets by mouth daily.   DILTIAZEM (CARDIZEM CD) 180 MG 24 HR CAPSULE    Take 180 mg by mouth daily.   DULOXETINE (CYMBALTA) 30 MG CAPSULE    Take 30 mg by mouth every evening.    FERROUS SULFATE 325 (65 FE) MG EC TABLET    Take 325 mg by mouth 2 (two) times daily.   FLUTICASONE (FLONASE) 50 MCG/ACT NASAL SPRAY    Place 2 sprays into the nose daily.     GABAPENTIN (NEURONTIN) 100 MG CAPSULE    Take 100 mg by mouth at bedtime.   KETOCONAZOLE (NIZORAL) 2 % CREAM    Apply 2 application topically as needed. rash   LATANOPROST (XALATAN) 0.005 % OPHTHALMIC SOLUTION    Place 1 drop into both eyes every evening.    LEVOTHYROXINE (SYNTHROID, LEVOTHROID) 100 MCG TABLET    Take 50-100 mcg by mouth daily before breakfast. Takes 30mg on sun and mon Takes 1066m all other days   METFORMIN (GLUCOPHAGE) 500 MG TABLET    Take 500 mg by mouth 2 (two) times daily with a meal.    ONDANSETRON (ZOFRAN) 4 MG TABLET    Take 1 tablet (4 mg total) by mouth every 8 (eight) hours as needed for nausea or vomiting.   OXYCODONE-ACETAMINOPHEN (PERCOCET) 5-325 MG PER TABLET    Take one tablet by mouth every 4 hours as needed for moderate pain. Do not exceed 4gm of Tylenol in 24 hours   OXYGEN    Inhale 2.5 L into the lungs at bedtime. Only use oxygen at night   POLYETHYLENE GLYCOL (MIRALAX / GLYCOLAX) PACKET    Take 17 g by mouth daily.     RIVAROXABAN (XARELTO) 10 MG TABS TABLET    Take 1 tablet (10 mg total) by mouth daily.   SACCHAROMYCES BOULARDII (FLORASTOR) 250 MG CAPSULE    Take 250 mg by mouth 2 (two) times daily. X 2 weeks, ending on  11/01/14   TIZANIDINE (ZANAFLEX) 4 MG TABLET    Take 1 tablet (4 mg total) by mouth every 6 (six) hours as needed for muscle spasms.  Modified Medications   No medications on file  Discontinued Medications   CEPHALEXIN ([place]  500 MG CAPSULE    Take 500 mg by mouth 3 (three) times daily. X 14 days starting 10/19/14, ending 11/02/14   VALSARTAN (DIOVAN) 80 MG TABLET    Take 80 mg by mouth daily.     Physical Exam: Filed Vitals:   11/07/14 1334  BP: 111/57  Pulse: 68  Temp: 97.4 F (36.3 C)  Resp: 20    Physical Exam  Constitutional: He is oriented to person, place, and time. He appears well-developed and well-nourished. No distress.  HENT:  Head: Normocephalic and atraumatic.  Mouth/Throat: Oropharynx is clear and moist. No oropharyngeal exudate.  Eyes: Conjunctivae and EOM are normal. Pupils are equal, round, and reactive to light.  Neck: Normal range of motion. Neck supple.  Cardiovascular: Normal rate, regular rhythm and normal heart sounds.   Pulmonary/Chest: Effort normal and breath sounds normal.  Abdominal: Soft. Bowel sounds are normal.  Musculoskeletal: He exhibits no edema or tenderness.  Limited ROM to left knee  Neurological: He is alert and oriented to person, place, and time.  Skin: Skin is warm and dry. He is not diaphoretic.  Psychiatric: He has a normal mood and affect.    Labs reviewed: Basic Metabolic Panel:  Recent Labs  06/30/14 1035 08/09/14 2305 09/29/14 1333 10/18/14  NA 140 137 137 142  K 4.5 4.3 5.2* 5.1  CL 103 102 100*  --   CO2 '28 26 30  '$ --   GLUCOSE 115* 134* 110*  --   BUN 21* '17 15 21  '$ CREATININE 0.75 0.85 0.74 0.9  CALCIUM 9.6 9.3 9.3  --    Liver Function Tests:  Recent Labs  03/03/14 0545 05/24/14 1425 10/18/14  AST 16 12 9*  ALT 13 8 5*  ALKPHOS 71 70 53  BILITOT 0.6 0.41  --   PROT 7.2 7.5  --   ALBUMIN 3.5 3.6  --    No results for input(s): LIPASE, AMYLASE in the last 8760 hours. No results for input(s): AMMONIA  in the last 8760 hours. CBC:  Recent Labs  03/02/14 1411  05/24/14 1424 06/30/14 1035 08/09/14 2305 09/29/14 1333 10/24/14  WBC 12.4*  < > 9.7 10.2 11.9* 9.4 11.1  NEUTROABS 8.7*  --  6.3 7.3  --   --   --   HGB 12.8*  < > 12.1* 13.5 12.5* 12.2* 10.3*  HCT 40.0  < > 38.5 42.2 39.3 38.6* 35*  MCV 97.8  < > 98.7* 95.7 93.8 96.3  --   PLT 213  < > 212 214 214 203 266  < > = values in this interval not displayed. TSH: No results for input(s): TSH in the last 8760 hours. A1C: Lab Results  Component Value Date   HGBA1C 6.1* 09/29/2014   Lipid Panel: No results for input(s): CHOL, HDL, LDLCALC, TRIG, CHOLHDL, LDLDIRECT in the last 8760 hours.    Assessment/Plan 1. Primary osteoarthritis of left knee S/p left TKA. Pain controlled on percocet 5-325 mg 1-2 tab q4h prn pain and zanaflex 4 mg q6h prn muscle spasm.  Cont with follow up with orthopedics.  Continue xarelto for dvt prophylaxis.   2. Essential hypertension -blood pressure was low and therefore valsartan was stopped, blood pressure controlled on Cardizem at this time.   3. Cellulitis of left knee Resolved with doxycyline   4. Acute blood loss anemia Post op, will need ongoing follow up with PCP  5. Centrilobular emphysema (HCC) COPD stable, on chronic o2 and prn albuterol  6. Type  2 diabetes mellitus with diabetic autonomic neuropathy, without long-term current use of insulin (HCC) continue metformin '500mg'$  twice daily with gabapentin '100mg'$  at bedtime for neuropathic pain.   7. Hypothyroidism, unspecified hypothyroidism type Continue levothyroxine 79mg on Sunday and Monday and 1029m daily on all other days. Continue to monitor   pt is stable for discharge-will need PT/OT/Nursing per home health. No DME needed. Rx written.  will need to follow up with PCP within 2 weeks.   [facility] [phone] am - 5 pm) [phone] hours)

## 2014-11-21 ENCOUNTER — Telehealth: Payer: Self-pay | Admitting: Oncology

## 2014-11-21 NOTE — Telephone Encounter (Signed)
Patient called to r/s 10/19 appointments. Patient given new appointment for lab/fu 11/29

## 2014-11-23 ENCOUNTER — Ambulatory Visit: Payer: Medicare Other | Admitting: Oncology

## 2014-11-23 ENCOUNTER — Other Ambulatory Visit: Payer: Medicare Other

## 2015-01-03 ENCOUNTER — Other Ambulatory Visit: Payer: Medicare Other

## 2015-01-03 ENCOUNTER — Ambulatory Visit: Payer: Medicare Other | Admitting: Oncology

## 2015-02-05 DIAGNOSIS — J189 Pneumonia, unspecified organism: Secondary | ICD-10-CM

## 2015-02-05 HISTORY — DX: Pneumonia, unspecified organism: J18.9

## 2015-02-07 ENCOUNTER — Encounter: Payer: Self-pay | Admitting: *Deleted

## 2015-02-07 ENCOUNTER — Other Ambulatory Visit: Payer: Self-pay | Admitting: Oncology

## 2015-02-07 DIAGNOSIS — Z125 Encounter for screening for malignant neoplasm of prostate: Secondary | ICD-10-CM

## 2015-02-07 DIAGNOSIS — D472 Monoclonal gammopathy: Secondary | ICD-10-CM

## 2015-02-07 NOTE — Progress Notes (Signed)
Faxed last 3 scans to brittany at dr timothy murphy's office. [phone]

## 2015-02-08 ENCOUNTER — Encounter: Payer: Self-pay | Admitting: *Deleted

## 2015-02-08 ENCOUNTER — Telehealth: Payer: Self-pay | Admitting: Oncology

## 2015-02-08 NOTE — Progress Notes (Signed)
Patient calling to ask if dr [name] will approve a steroid injection in his back. Patient scheduled for a lab, bone scan 02/15/15 and dr [name] O.V. 02/16/15 i called brittany at dr Jamison Oka office. She will have dr Percell Miller call patient and explain the situation.

## 2015-02-08 NOTE — Telephone Encounter (Signed)
Spoke with patient re lab/fu with Dr. [name] for 1/11 - lab and 1/12 FS. Also confimed bone scan for 1/11. F/u with FS scheduled for 1/12 due to scans on 1/11. Schedule mailed.

## 2015-02-10 ENCOUNTER — Encounter (HOSPITAL_COMMUNITY): Payer: Self-pay | Admitting: Emergency Medicine

## 2015-02-10 ENCOUNTER — Emergency Department (HOSPITAL_COMMUNITY): Payer: Medicare Other

## 2015-02-10 ENCOUNTER — Inpatient Hospital Stay (HOSPITAL_COMMUNITY)
Admission: EM | Admit: 2015-02-10 | Discharge: 2015-02-12 | DRG: 190 | Disposition: A | Discharge status: Home / Self Care | Payer: Medicare Other | Attending: Internal Medicine | Admitting: Internal Medicine

## 2015-02-10 DIAGNOSIS — E1100 Type 2 diabetes mellitus with hyperosmolarity without nonketotic hyperglycemic-hyperosmolar coma (NKHHC): Secondary | ICD-10-CM

## 2015-02-10 DIAGNOSIS — Z79899 Other long term (current) drug therapy: Secondary | ICD-10-CM | POA: Diagnosis not present

## 2015-02-10 DIAGNOSIS — M1710 Unilateral primary osteoarthritis, unspecified knee: Secondary | ICD-10-CM

## 2015-02-10 DIAGNOSIS — R0902 Hypoxemia: Secondary | ICD-10-CM

## 2015-02-10 DIAGNOSIS — E785 Hyperlipidemia, unspecified: Secondary | ICD-10-CM

## 2015-02-10 DIAGNOSIS — I480 Paroxysmal atrial fibrillation: Secondary | ICD-10-CM | POA: Diagnosis present

## 2015-02-10 DIAGNOSIS — Z8249 Family history of ischemic heart disease and other diseases of the circulatory system: Secondary | ICD-10-CM

## 2015-02-10 DIAGNOSIS — Z87891 Personal history of nicotine dependence: Secondary | ICD-10-CM

## 2015-02-10 DIAGNOSIS — Z85828 Personal history of other malignant neoplasm of skin: Secondary | ICD-10-CM | POA: Diagnosis not present

## 2015-02-10 DIAGNOSIS — J449 Chronic obstructive pulmonary disease, unspecified: Secondary | ICD-10-CM | POA: Diagnosis present

## 2015-02-10 DIAGNOSIS — Z811 Family history of alcohol abuse and dependence: Secondary | ICD-10-CM

## 2015-02-10 DIAGNOSIS — R011 Cardiac murmur, unspecified: Secondary | ICD-10-CM | POA: Diagnosis present

## 2015-02-10 DIAGNOSIS — Z880 Allergy status to penicillin: Secondary | ICD-10-CM | POA: Diagnosis not present

## 2015-02-10 DIAGNOSIS — M1990 Unspecified osteoarthritis, unspecified site: Secondary | ICD-10-CM | POA: Diagnosis present

## 2015-02-10 DIAGNOSIS — D649 Anemia, unspecified: Secondary | ICD-10-CM | POA: Diagnosis present

## 2015-02-10 DIAGNOSIS — J9610 Chronic respiratory failure, unspecified whether with hypoxia or hypercapnia: Secondary | ICD-10-CM | POA: Diagnosis present

## 2015-02-10 DIAGNOSIS — Z96652 Presence of left artificial knee joint: Secondary | ICD-10-CM | POA: Diagnosis present

## 2015-02-10 DIAGNOSIS — E875 Hyperkalemia: Secondary | ICD-10-CM | POA: Diagnosis present

## 2015-02-10 DIAGNOSIS — J441 Chronic obstructive pulmonary disease with (acute) exacerbation: Secondary | ICD-10-CM

## 2015-02-10 DIAGNOSIS — G4733 Obstructive sleep apnea (adult) (pediatric): Secondary | ICD-10-CM

## 2015-02-10 DIAGNOSIS — Z9889 Other specified postprocedural states: Secondary | ICD-10-CM | POA: Diagnosis not present

## 2015-02-10 DIAGNOSIS — J440 Chronic obstructive pulmonary disease with acute lower respiratory infection: Principal | ICD-10-CM | POA: Diagnosis present

## 2015-02-10 DIAGNOSIS — Z8659 Personal history of other mental and behavioral disorders: Secondary | ICD-10-CM

## 2015-02-10 DIAGNOSIS — J189 Pneumonia, unspecified organism: Secondary | ICD-10-CM | POA: Diagnosis present

## 2015-02-10 DIAGNOSIS — M545 Low back pain: Secondary | ICD-10-CM

## 2015-02-10 DIAGNOSIS — Z888 Allergy status to other drugs, medicaments and biological substances status: Secondary | ICD-10-CM

## 2015-02-10 DIAGNOSIS — Z87442 Personal history of urinary calculi: Secondary | ICD-10-CM | POA: Diagnosis not present

## 2015-02-10 DIAGNOSIS — R079 Chest pain, unspecified: Secondary | ICD-10-CM | POA: Diagnosis not present

## 2015-02-10 DIAGNOSIS — R609 Edema, unspecified: Secondary | ICD-10-CM

## 2015-02-10 DIAGNOSIS — M810 Age-related osteoporosis without current pathological fracture: Secondary | ICD-10-CM | POA: Diagnosis present

## 2015-02-10 DIAGNOSIS — E039 Hypothyroidism, unspecified: Secondary | ICD-10-CM

## 2015-02-10 DIAGNOSIS — M79605 Pain in left leg: Secondary | ICD-10-CM

## 2015-02-10 DIAGNOSIS — Z833 Family history of diabetes mellitus: Secondary | ICD-10-CM | POA: Diagnosis not present

## 2015-02-10 DIAGNOSIS — E119 Type 2 diabetes mellitus without complications: Secondary | ICD-10-CM

## 2015-02-10 DIAGNOSIS — H409 Unspecified glaucoma: Secondary | ICD-10-CM | POA: Diagnosis present

## 2015-02-10 DIAGNOSIS — I10 Essential (primary) hypertension: Secondary | ICD-10-CM | POA: Diagnosis present

## 2015-02-10 DIAGNOSIS — Z8679 Personal history of other diseases of the circulatory system: Secondary | ICD-10-CM

## 2015-02-10 DIAGNOSIS — Z8551 Personal history of malignant neoplasm of bladder: Secondary | ICD-10-CM | POA: Diagnosis not present

## 2015-02-10 DIAGNOSIS — Z9981 Dependence on supplemental oxygen: Secondary | ICD-10-CM | POA: Diagnosis not present

## 2015-02-10 DIAGNOSIS — M1712 Unilateral primary osteoarthritis, left knee: Secondary | ICD-10-CM

## 2015-02-10 LAB — BASIC METABOLIC PANEL
ANION GAP: 8 (ref 5–15)
Anion gap: 12 (ref 5–15)
BUN: 24 mg/dL — ABNORMAL HIGH (ref 6–20)
BUN: 20 mg/dL (ref 6–20)
CALCIUM: 9.1 mg/dL (ref 8.9–10.3)
CHLORIDE: 100 mmol/L — AB (ref 101–111)
CHLORIDE: 98 mmol/L — AB (ref 101–111)
CO2: 30 mmol/L (ref 22–32)
CO2: 27 mmol/L (ref 22–32)
CREATININE: 0.84 mg/dL (ref 0.61–1.24)
Calcium: 9.1 mg/dL (ref 8.9–10.3)
Creatinine, Ser: 0.79 mg/dL (ref 0.61–1.24)
GFR calc Af Amer: >60 mL/min (ref >60)
GFR calc Af Amer: >60 mL/min (ref >60)
GFR calc non Af Amer: >60 mL/min (ref >60)
GFR calc non Af Amer: >60 mL/min (ref >60)
GLUCOSE: 119 mg/dL — AB (ref 65–99)
Glucose, Bld: 188 mg/dL — ABNORMAL HIGH (ref 65–99)
POTASSIUM: 6.5 mmol/L — AB (ref 3.5–5.1)
POTASSIUM: 5 mmol/L (ref 3.5–5.1)
SODIUM: 138 mmol/L (ref 135–145)
SODIUM: 137 mmol/L (ref 135–145)

## 2015-02-10 LAB — I-STAT TROPONIN, ED: Troponin i, poc: 0 ng/mL (ref 0.00–0.08)

## 2015-02-10 LAB — CBC WITH DIFFERENTIAL/PLATELET
BASOS ABS: 0 10*3/uL (ref 0.0–0.1)
Basophils Relative: 0 %
EOS PCT: 1 %
Eosinophils Absolute: 0.2 10*3/uL (ref 0.0–0.7)
HCT: 36.6 % — ABNORMAL LOW (ref 39.0–52.0)
Hemoglobin: 10.8 g/dL — ABNORMAL LOW (ref 13.0–17.0)
LYMPHS ABS: 1.4 10*3/uL (ref 0.7–4.0)
LYMPHS PCT: 8 %
MCH: 27.7 pg (ref 26.0–34.0)
MCHC: 29.5 g/dL — AB (ref 30.0–36.0)
MCV: 93.8 fL (ref 78.0–100.0)
MONO ABS: 1.7 10*3/uL — AB (ref 0.1–1.0)
MONOS PCT: 10 %
NEUTROS ABS: 13.9 10*3/uL — AB (ref 1.7–7.7)
Neutrophils Relative %: 81 %
PLATELETS: 265 10*3/uL (ref 150–400)
RBC: 3.9 MIL/uL — ABNORMAL LOW (ref 4.22–5.81)
RDW: 14.8 % (ref 11.5–15.5)
WBC: 17.1 10*3/uL — ABNORMAL HIGH (ref 4.0–10.5)

## 2015-02-10 LAB — LACTIC ACID, PLASMA: LACTIC ACID, VENOUS: 1.7 mmol/L (ref 0.5–2.0)

## 2015-02-10 LAB — BRAIN NATRIURETIC PEPTIDE: B Natriuretic Peptide: 47.8 pg/mL (ref 0.0–100.0)

## 2015-02-10 LAB — PROCALCITONIN: Procalcitonin: <0.1 ng/mL

## 2015-02-10 LAB — GLUCOSE, CAPILLARY: GLUCOSE-CAPILLARY: 188 mg/dL — AB (ref 65–99)

## 2015-02-10 LAB — POTASSIUM: Potassium: 5.8 mmol/L — ABNORMAL HIGH (ref 3.5–5.1)

## 2015-02-10 LAB — TSH: TSH: 0.29 u[IU]/mL — ABNORMAL LOW (ref 0.350–4.500)

## 2015-02-10 MED ORDER — DILTIAZEM HCL 60 MG PO TABS
60.0000 mg | ORAL_TABLET | Freq: Three times a day (TID) | ORAL | Status: DC
  Administered 2015-02-10 – 2015-02-12 (×6): 60 mg via ORAL
  Filled 2015-02-10 (×6): qty 1

## 2015-02-10 MED ORDER — SODIUM CHLORIDE 0.9 % IV SOLN
1.0000 g | Freq: Once | INTRAVENOUS | Status: AC
  Administered 2015-02-10: 1 g via INTRAVENOUS
  Filled 2015-02-10: qty 10

## 2015-02-10 MED ORDER — LEVOTHYROXINE SODIUM 50 MCG PO TABS: 50.0000 ug | ORAL_TABLET | Freq: Every day | ORAL | Status: DC

## 2015-02-10 MED ORDER — DULOXETINE HCL 30 MG PO CPEP
30.0000 mg | ORAL_CAPSULE | Freq: Every evening | ORAL | Status: DC
  Administered 2015-02-10 – 2015-02-12 (×3): 30 mg via ORAL
  Filled 2015-02-10 (×3): qty 1

## 2015-02-10 MED ORDER — CALCIUM-VITAMIN D-VITAMIN K 500-1000-40 MG-UNT-MCG PO CHEW: 1.0000 | CHEWABLE_TABLET | Freq: Every day | ORAL | Status: DC

## 2015-02-10 MED ORDER — LEVOFLOXACIN IN D5W 750 MG/150ML IV SOLN
750.0000 mg | INTRAVENOUS | Status: DC
  Administered 2015-02-10 – 2015-02-11 (×2): 750 mg via INTRAVENOUS
  Filled 2015-02-10 (×2): qty 150

## 2015-02-10 MED ORDER — IPRATROPIUM-ALBUTEROL 0.5-2.5 (3) MG/3ML IN SOLN: 3.0000 mL | RESPIRATORY_TRACT | Status: DC | PRN

## 2015-02-10 MED ORDER — ALPRAZOLAM 0.25 MG PO TABS
0.2500 mg | ORAL_TABLET | Freq: Every evening | ORAL | Status: DC | PRN
  Administered 2015-02-10: 0.25 mg via ORAL
  Filled 2015-02-10: qty 1

## 2015-02-10 MED ORDER — INSULIN ASPART 100 UNIT/ML ~~LOC~~ SOLN
5.0000 [IU] | Freq: Once | SUBCUTANEOUS | Status: AC
  Administered 2015-02-10: 5 [IU] via INTRAVENOUS
  Filled 2015-02-10: qty 1

## 2015-02-10 MED ORDER — DEXTROSE 5 % IV SOLN
500.0000 mg | Freq: Once | INTRAVENOUS | Status: AC
  Administered 2015-02-10: 500 mg via INTRAVENOUS
  Filled 2015-02-10: qty 500

## 2015-02-10 MED ORDER — GABAPENTIN 100 MG PO CAPS
100.0000 mg | ORAL_CAPSULE | Freq: Every day | ORAL | Status: DC
  Administered 2015-02-10 – 2015-02-11 (×2): 100 mg via ORAL
  Filled 2015-02-10 (×2): qty 1

## 2015-02-10 MED ORDER — LATANOPROST 0.005 % OP SOLN
1.0000 [drp] | Freq: Every evening | OPHTHALMIC | Status: DC
Start: 2015-02-11 — End: 2015-02-10
  Filled 2015-02-10: qty 2.5

## 2015-02-10 MED ORDER — FLUTICASONE PROPIONATE 50 MCG/ACT NA SUSP
2.0000 | Freq: Every day | NASAL | Status: DC
Start: 2015-02-11 — End: 2015-02-12
  Administered 2015-02-11 – 2015-02-12 (×2): 2 via NASAL
  Filled 2015-02-10: qty 16

## 2015-02-10 MED ORDER — LEVOTHYROXINE SODIUM 100 MCG PO TABS
100.0000 ug | ORAL_TABLET | ORAL | Status: DC
  Administered 2015-02-11: 100 ug via ORAL
  Filled 2015-02-10: qty 1

## 2015-02-10 MED ORDER — CALCIUM CARBONATE-VITAMIN D 500-200 MG-UNIT PO TABS
1.0000 | ORAL_TABLET | Freq: Every day | ORAL | Status: DC
Start: 2015-02-11 — End: 2015-02-12
  Administered 2015-02-11 – 2015-02-12 (×2): 1 via ORAL
  Filled 2015-02-10 (×2): qty 1

## 2015-02-10 MED ORDER — ATORVASTATIN CALCIUM 10 MG PO TABS
10.0000 mg | ORAL_TABLET | Freq: Every day | ORAL | Status: DC
  Administered 2015-02-10 – 2015-02-12 (×3): 10 mg via ORAL
  Filled 2015-02-10 (×3): qty 1

## 2015-02-10 MED ORDER — DEXTROSE 50 % IV SOLN
1.0000 | Freq: Once | INTRAVENOUS | Status: AC
  Administered 2015-02-10: 50 mL via INTRAVENOUS
  Filled 2015-02-10: qty 50

## 2015-02-10 MED ORDER — OXYCODONE-ACETAMINOPHEN 5-325 MG PO TABS
1.0000 | ORAL_TABLET | ORAL | Status: DC | PRN
  Administered 2015-02-10: 2 via ORAL
  Administered 2015-02-11: 1 via ORAL
  Administered 2015-02-11: 2 via ORAL
  Filled 2015-02-10 (×3): qty 1
  Filled 2015-02-10: qty 2

## 2015-02-10 MED ORDER — RIVAROXABAN 10 MG PO TABS
10.0000 mg | ORAL_TABLET | Freq: Every day | ORAL | Status: DC
  Administered 2015-02-11 – 2015-02-12 (×2): 10 mg via ORAL
  Filled 2015-02-10 (×2): qty 1

## 2015-02-10 MED ORDER — POLYETHYLENE GLYCOL 3350 17 G PO PACK
17.0000 g | PACK | Freq: Every day | ORAL | Status: DC
  Filled 2015-02-10 (×2): qty 1

## 2015-02-10 MED ORDER — SODIUM CHLORIDE 0.9 % IV SOLN: INTRAVENOUS | Status: AC

## 2015-02-10 MED ORDER — SODIUM CHLORIDE 0.9 % IV SOLN
INTRAVENOUS | Status: DC
  Administered 2015-02-10: 19:00:00 via INTRAVENOUS

## 2015-02-10 MED ORDER — LEVOTHYROXINE SODIUM 50 MCG PO TABS
50.0000 ug | ORAL_TABLET | ORAL | Status: DC
  Administered 2015-02-12: 50 ug via ORAL
  Filled 2015-02-10: qty 1

## 2015-02-10 MED ORDER — INSULIN ASPART 100 UNIT/ML ~~LOC~~ SOLN
0.0000 [IU] | Freq: Three times a day (TID) | SUBCUTANEOUS | Status: DC
  Administered 2015-02-11: 3 [IU] via SUBCUTANEOUS
  Administered 2015-02-11: 5 [IU] via SUBCUTANEOUS
  Administered 2015-02-12: 2 [IU] via SUBCUTANEOUS

## 2015-02-10 MED ORDER — LATANOPROST 0.005 % OP SOLN
1.0000 [drp] | Freq: Every evening | OPHTHALMIC | Status: DC
  Administered 2015-02-10 – 2015-02-12 (×3): 1 [drp] via OPHTHALMIC
  Filled 2015-02-10: qty 2.5

## 2015-02-10 MED ORDER — DEXTROSE 5 % IV SOLN
1.0000 g | Freq: Once | INTRAVENOUS | Status: AC
  Administered 2015-02-10: 1 g via INTRAVENOUS
  Filled 2015-02-10: qty 10

## 2015-02-10 MED ORDER — FERROUS SULFATE 325 (65 FE) MG PO TABS
325.0000 mg | ORAL_TABLET | Freq: Two times a day (BID) | ORAL | Status: DC
  Administered 2015-02-11 – 2015-02-12 (×3): 325 mg via ORAL
  Filled 2015-02-10 (×4): qty 1

## 2015-02-10 NOTE — Progress Notes (Signed)
Patient transferred to room 3E21 from the ED via stretcher. Oriented patient to the Heart failure floor and room equipment. Notified CMT of new admit and verified patient information and telemetry monitor placement and leads. Currently patient complains of no chest pain. VSS. Will continue to monitor patient to end of shift.

## 2015-02-10 NOTE — ED Provider Notes (Addendum)
Recent seen and evaluated. Has clearly reproducible left pectoralis and chest wall pain reproduced with pectoralis movements. Since his knee surgery he has been getting out of bed rather differently and awkwardly. He lays on his left side. He then abducts his arm with the elbow bent to prop up on his elbow, and then pulls himself up further with his right arm. Doing this at the bedside reproduces his symptoms.  The time of my initial evaluation lab and x-ray are pending. EKG shows no changes.  Tanna Furry, MD 02/10/15 [place], MD 02/11/15 1210

## 2015-02-10 NOTE — H&P (Signed)
[facility] History and Physical  Taylor [name] PNT:[id number] DOB: 1928/09/27 DOA: 02/10/2015  Referring physician: ED physician PCP: [name], MD  Specialists:   Chief Complaint: Left-sided chest pain, SOB  HPI: Taylor [name] is a 82 y.o. male with PMH of COPD on home O2 at night, paroxysmal atrial fibrillation on Zarrella toe, and hypothyroidism who presents to the ED with left-sided chest pain and dyspnea. Pain is described as sharp, severe, and intermittent. Pain is worse with deep inspiration or cough. The pain is seemingly unaffected by exertion versus rest. Patient has not had similar symptoms previously. He localizes the pain to the left lateral chest wall, at the anterior axillary line. He notes accompanying dyspnea, but denies fevers, chills, palpitations, or edema. He denies orthopnea or PND.  In ED, patient was found to be afebrile, saturating adequately on 2 L/m, and with vital signs stable. Sinus rhythm was noted on admission EKG and a conspicuous left lower lobe infiltrate consistent with pneumonia was identified on chest x-ray. Initial blood work returned with a serum potassium of 6.5 with normal serum creatinine. There was a leukocytosis to 17,000 and a normocytic anemia, slightly worse than prior. Patient was given temporizing medications for the hyperkalemia in the emergency department, empiric antibiotics were ordered, and patient admitted for ongoing evaluation and management of hyperkalemia and CAP.  Where does patient live?   At home     Can patient participate in ADLs?  Yes      Review of Systems:   General: no fevers, chills, sweats, weight change, or poor appetite. Fatigue, malaise HEENT: no blurry vision, hearing changes or sore throat Pulm:  Dyspnea, non-productive cough, left-sided chest pain with deep inspiration or cough CV:  Chest pain with cough or deep inspiration, no palpitations Abd: no nausea, vomiting, abdominal pain, diarrhea, or  constipation GU: no dysuria, hematuria, increased urinary frequency, or urgency  Ext: no leg edema Neuro: no focal weakness, numbness, or tingling, no vision change or hearing loss Skin: no rash, no wounds MSK: No muscle spasm, no deformity, no red, hot, or swollen joint Heme:  Easy bruising & bleeding on Xarleto Travel history: No recent long distant travel    Allergy:  Allergies  Allergen Reactions  . Amoxicillin Palpitations  . Motrin [Ibuprofen] Palpitations  . Chantix [Varenicline Tartrate] Other (See Comments)    depression    Past Medical History  Diagnosis Date  . Hypertension   . Hyperlipidemia   . Hypothyroidism   . COPD (chronic obstructive pulmonary disease) ([place])   . Atrial fibrillation ([place])   . Glaucoma   . Anxiety   . History of bladder cancer   . Lung nodule     SMALL  . OSA (obstructive sleep apnea)   . Heart murmur   . Dysrhythmia     Hx of Atrial Flutter  . Deep vein blood clot of left lower extremity ([place])   . On home oxygen therapy     At night, during the day PRN 2.5L  . Cancer ([place])     bladder cancer, skin cancer  . Diabetes mellitus without complication (HCC)     Type 2  . Depression   . Kidney stones 2016  . Arthritis   . Osteoporosis     Past Surgical History  Procedure Laterality Date  . Cardiovascular stress test  12/25/2007    EF 64%. NO EVIDENCE OF ISCHEMIA  . Cystoscopy N/A 12/15  . Varicose vein surgery  at 82 years old first, and about 20 years ago  . Vascular surgery    . [facility] surgery  10-18-11    [facility]  . Tonsillectomy    . Eye surgery Bilateral     cataracts  . Multiple tooth extractions    . Total knee arthroplasty Left 10/11/2014    Procedure: TOTAL KNEE ARTHROPLASTY;  Surgeon: Renette Butters, MD;  Location: [place];  Service: Orthopedics;  Laterality: Left;    Social History:  reports that he quit smoking about 4 years ago. His smoking use included Cigarettes. He has a 90 pack-year smoking  history. He has never used smokeless tobacco. He reports that he does not drink alcohol or use illicit drugs.  Family History:  Family History  Problem Relation Age of Onset  . Cancer - Lung Mother     51  . Heart attack Mother   . Heart Problems Father   . Diabetes Brother   . Alcoholism Brother   . Other Daughter     [place]  . Other Other     [place]     Prior to Admission medications   Medication Sig Start Date End Date Taking? Authorizing Provider  albuterol (PROVENTIL HFA;VENTOLIN HFA) 108 (90 BASE) MCG/ACT inhaler Inhale 2 puffs into the lungs every 4 (four) hours as needed for wheezing or shortness of breath.   Yes Historical Provider, MD  albuterol (PROVENTIL) (2.5 MG/3ML) 0.083% nebulizer solution Take 2.5 mg by nebulization every 6 (six) hours as needed for shortness of breath.  04/25/14  Yes Historical Provider, MD  alendronate (FOSAMAX) 70 MG tablet Take 70 mg by mouth once a week. Monday 06/08/14  Yes Historical Provider, MD  ALPRAZolam Duanne Moron) 0.25 MG tablet Take one tablet by mouth every night at bedtime as needed for anxiety 10/28/14  Yes Tiffany L Reed, DO  atorvastatin (LIPITOR) 10 MG tablet Take 10 mg by mouth daily.   Yes Historical Provider, MD  Calcium Carbonate-Vitamin D (CALCIUM + D PO) Take 2 tablets by mouth daily.   Yes Historical Provider, MD  diltiazem (CARDIZEM CD) 180 MG 24 hr capsule Take 180 mg by mouth daily.   Yes Historical Provider, MD  DULoxetine (CYMBALTA) 30 MG capsule Take 30 mg by mouth every evening.    Yes Historical Provider, MD  ferrous sulfate 325 (65 FE) MG EC tablet Take 325 mg by mouth 2 (two) times daily.   Yes Historical Provider, MD  fluticasone (FLONASE) 50 MCG/ACT nasal spray Place 2 sprays into the nose daily.     Yes Historical Provider, MD  gabapentin (NEURONTIN) 100 MG capsule Take 100 mg by mouth at bedtime. 09/06/14  Yes Historical Provider, MD  ketoconazole (NIZORAL) 2 % cream Apply 2 application topically as  needed. rash 08/28/14  Yes Historical Provider, MD  latanoprost (XALATAN) 0.005 % ophthalmic solution Place 1 drop into both eyes every evening.    Yes Historical Provider, MD  levothyroxine (SYNTHROID, LEVOTHROID) 100 MCG tablet Take 50-100 mcg by mouth daily before breakfast. Takes 40mg on sun and mon Takes 1040m all other days   Yes Historical Provider, MD  metFORMIN (GLUCOPHAGE) 500 MG tablet Take 500 mg by mouth daily.    Yes Historical Provider, MD  oxyCODONE-acetaminophen (PERCOCET) 5-325 MG per tablet Take one tablet by mouth every 4 hours as needed for moderate pain. Do not exceed 4gm of Tylenol in 24 hours 10/28/14  Yes MoGildardo CrankerDO  OXYGEN Inhale 2.5 L  into the lungs at bedtime. Only use oxygen at night   Yes Historical Provider, MD  polyethylene glycol (MIRALAX / GLYCOLAX) packet Take 17 g by mouth daily.     Yes Historical Provider, MD  rivaroxaban (XARELTO) 10 MG TABS tablet Take 1 tablet (10 mg total) by mouth daily. 10/11/14  Yes Brittney Kelly, PA-C  ondansetron (ZOFRAN) 4 MG tablet Take 1 tablet (4 mg total) by mouth every 8 (eight) hours as needed for nausea or vomiting. Patient not taking: Reported on 02/10/2015 10/11/14   Lovett Calender, PA-C  tiZANidine (ZANAFLEX) 4 MG tablet Take 1 tablet (4 mg total) by mouth every 6 (six) hours as needed for muscle spasms. Patient not taking: Reported on 02/10/2015 10/11/14   Lovett Calender, PA-C    Physical Exam: Filed Vitals:   02/10/15 1715 02/10/15 1730 02/10/15 1745 02/10/15 1900  BP: 109/63 148/61 119/54 125/57  Pulse: 64 73 64   Temp:      TempSrc:      Resp: '15 21 12 17  '$ SpO2: 97% 99% 99%    General: In mild respiratory distress with accessory muscle recruitment  HEENT:       Eyes: PERRL, EOMI, no scleral icterus or conjunctival pallor.       ENT: No discharge from the ears or nose, no pharyngeal ulcers, petechiae or exudate.        Neck: No JVD, no bruit, no appreciable mass Heme: No cervical adenopathy, no pallor Cardiac:  Rate ~60 and regular, no significant murmurs, No gallops or rubs. Pulm: Diminished on left over the base, expiratory wheeze over upper left fields; right lung fields expiratory wheeze. Abd: Soft, nondistended, nontender, no rebound pain or gaurding, no mass or organomegaly, BS present. Ext: No LE edema bilaterally. 2+DP/PT pulse bilaterally. Musculoskeletal: No gross deformity, no red, hot, swollen joints   Skin: No rashes or wounds on exposed surfaces  Neuro: Alert, oriented X3, cranial nerves II-XII grossly intact. No focal findings Psych: Patient is not overtly psychotic, appropriate mood and affect.  Labs on Admission:  Basic Metabolic Panel:  Recent Labs Lab 02/10/15 1525 02/10/15 1638  NA 138  --   K 6.5* 5.8*  CL 100*  --   CO2 30  --   GLUCOSE 119*  --   BUN 24*  --   CREATININE 0.84  --   CALCIUM 9.1  --    Liver Function Tests: No results for input(s): AST, ALT, ALKPHOS, BILITOT, PROT, ALBUMIN in the last 168 hours. No results for input(s): LIPASE, AMYLASE in the last 168 hours. No results for input(s): AMMONIA in the last 168 hours. CBC:  Recent Labs Lab 02/10/15 1525  WBC 17.1*  NEUTROABS 13.9*  HGB 10.8*  HCT 36.6*  MCV 93.8  PLT 265   Cardiac Enzymes: No results for input(s): CKTOTAL, CKMB, CKMBINDEX, TROPONINI in the last 168 hours.  BNP (last 3 results) No results for input(s): BNP in the last 8760 hours.  ProBNP (last 3 results) No results for input(s): PROBNP in the last 8760 hours.  CBG: No results for input(s): GLUCAP in the last 168 hours.  Radiological Exams on Admission: Dg Chest 2 View  02/10/2015  CLINICAL DATA:  Left-sided chest pain in the mornings for the last few days. EXAM: CHEST  2 VIEW COMPARISON:  04/12/2011 FINDINGS: Heart size is normal. There is atherosclerosis of the aorta. The right lung is clear. There is abnormal density in the left lower lobe posteriorly most consistent with bronchopneumonia. Follow-up  to clearing  recommended to ensure there is not a mass lesion. No effusion. No acute bone finding. IMPRESSION: Left lower lobe infiltrate most consistent with bronchopneumonia. Follow-up to clearing to rule out underlying mass. Electronically Signed   By: Nelson Chimes M.D.   On: 02/10/2015 15:12    EKG: Independently reviewed.  Abnormal findings:   Sinus rhythm, right axis deviation    Assessment/Plan  1. CAP, organism unknown - Conspicuous LLL lobar infiltrate on CXR, should be re-imaged 3-4 wks after treatment to r/o underlying mass  - Was rehabing in SNF after knee surgery, but discharged 11/07/14, so treating as CAP initially  - Empiric Levaquin given penicillin allergy  - Blood and sputum cultures, strep pneumo urine antigen pending  - Check lactate, procalcitonin  - Will tailor abx according to forthcoming culture and lab data, clinical response   - Continuous pulse oximetry, titrating supplemental O2 to maintain sats >92%  2. Hyperkalemia  - K+ 6.5 on admission with peaked T waves - Etiology unclear at this time; no offending drugs on med list, no renal failure  - Temporizing measures given in the ED  - Will give gentle IVF hydration, repeat BMP q4h overnight, repeat temporizing measures as needed  - Check urine studies    3. COPD  - Using 2.5 Lpm supplemental O2 at night while home  - Will continue nasal canula, titrating FiO2 to keep sats low 90s  - DuoNebs q2h prn SOB or wheezing  - Continuous pulse oximetry   4. Type II DM  - Holding home metformin while inpt  - A1c 6.1% in August '16, suggesting excellent control  - CBGs QID, SSI correctional prn   - Carb modified diet   5. Paroxysmal atrial fib  - Currently in sinus  - Continue Xarelto  - Continue diltiazem, but given PNA and concern for developing sepsis, will convert to the short-acting tabs while inpt  - Monitor on telemetry     DVT ppx:  On Xarelto  Code Status: Full code Family Communication: None at bed side.   Disposition Plan: Admit to inpatient   Date of Service 02/10/2015    Vianne Bulls, MD [facility] Pager [phone]  If 7PM-7AM, please contact night-coverage [email or web address] Password [facility] 02/10/2015, 7:36 PM

## 2015-02-10 NOTE — ED Notes (Signed)
Pt here via EMS from home with c/o left sided CP sudden onset while at rest with no associated symptoms.. Pain is reproducible upon palpation. No reported cardiac hx. Pain 8/10 at present. No injury.

## 2015-02-10 NOTE — ED Notes (Signed)
Patient transported to X-ray 

## 2015-02-10 NOTE — ED Provider Notes (Signed)
CSN: [id number]     Arrival date & time 02/10/15  1421 History   First MD Initiated Contact with Patient 02/10/15 1422     Chief Complaint  Patient presents with  . Chest Pain     (Consider location/radiation/quality/duration/timing/severity/associated sxs/prior Treatment) Patient is a 82 y.o. male presenting with chest pain. The history is provided by the patient and medical records.  Chest Pain   82 year old male with history of hypertension, hyperlipidemia, hypothyroidism, COPD, A. fib on Xarelto, hx of DVT without PE, hx bladder cancer, kidney stones, DM, depression, presenting to the ED for chest pain.  Patient states pain began this morning, localized to lateral left chest.  He states pain is worse when you push on the left side of his chest.  He has SOB at baseline, uses home O2 at night, but has had no change from baseline.  No diaphoresis, nausea, vomiting, dizziness, or weakness.  No fever, cough, or recent sick contacts.  Patient does have cardiac hx, followed by Dr. Acie Fredrickson.  Patient has already has his ASA today.  VSS.  Past Medical History  Diagnosis Date  . Hypertension   . Hyperlipidemia   . Hypothyroidism   . COPD (chronic obstructive pulmonary disease)   . Atrial fibrillation   . Glaucoma   . Anxiety   . History of bladder cancer   . Lung nodule     SMALL  . OSA (obstructive sleep apnea)   . Heart murmur   . Dysrhythmia     Hx of Atrial Flutter  . Deep vein blood clot of left lower extremity   . On home oxygen therapy     At night, during the day PRN 2.5L  . Cancer     bladder cancer, skin cancer  . Diabetes mellitus without complication     Type 2  . Depression   . Kidney stones 2016  . Arthritis   . Osteoporosis    Past Surgical History  Procedure Laterality Date  . Cardiovascular stress test  12/25/2007    EF 64%. NO EVIDENCE OF ISCHEMIA  . Cystoscopy N/A 12/15  . Varicose vein surgery      at 82 years old first, and about 20 years ago  . Vascular  surgery    . Back surgery  10-18-11    [facility]  . Tonsillectomy    . Eye surgery Bilateral     cataracts  . Multiple tooth extractions    . Total knee arthroplasty Left 10/11/2014    Procedure: TOTAL KNEE ARTHROPLASTY;  Surgeon: Renette Butters, MD;  Location: [place];  Service: Orthopedics;  Laterality: Left;   Family History  Problem Relation Age of Onset  . Cancer - Lung Mother     75  . Heart attack Mother   . Heart Problems Father   . Diabetes Brother   . Alcoholism Brother   . Other Daughter     [place]  . Other Other     [place]   Social History  Substance Use Topics  . Smoking status: Former Smoker -- 1.50 packs/day for 60 years    Types: Cigarettes    Quit date: 11/27/2010  . Smokeless tobacco: Never Used  . Alcohol Use: No    Review of Systems  Cardiovascular: Positive for chest pain.  All other systems reviewed and are negative.     Allergies  Amoxicillin; Motrin; and Chantix  Home Medications   Prior to Admission medications  Medication Sig Start Date End Date Taking? Authorizing Provider  albuterol (PROVENTIL HFA;VENTOLIN HFA) 108 (90 BASE) MCG/ACT inhaler Inhale 2 puffs into the lungs every 4 (four) hours as needed for wheezing or shortness of breath.    Historical Provider, MD  albuterol (PROVENTIL) (2.5 MG/3ML) 0.083% nebulizer solution Take 2.5 mg by nebulization every 6 (six) hours as needed for shortness of breath.  04/25/14   Historical Provider, MD  alendronate (FOSAMAX) 70 MG tablet Take 70 mg by mouth once a week. Monday 06/08/14   Historical Provider, MD  ALPRAZolam Duanne Moron) 0.25 MG tablet Take one tablet by mouth every night at bedtime as needed for anxiety 10/28/14   Gayland Curry, DO  ALPRAZolam Duanne Moron) 0.5 MG tablet 0.25 mg(1/2 tablet) at bedtime as needed 05/23/14   Historical Provider, MD  atorvastatin (LIPITOR) 10 MG tablet Take 10 mg by mouth daily.    Historical Provider, MD  Calcium Carbonate-Vitamin D  (CALCIUM + D PO) Take 2 tablets by mouth daily.    Historical Provider, MD  diltiazem (CARDIZEM CD) 180 MG 24 hr capsule Take 180 mg by mouth daily.    Historical Provider, MD  DULoxetine (CYMBALTA) 30 MG capsule Take 30 mg by mouth every evening.     Historical Provider, MD  ferrous sulfate 325 (65 FE) MG EC tablet Take 325 mg by mouth 2 (two) times daily.    Historical Provider, MD  fluticasone (FLONASE) 50 MCG/ACT nasal spray Place 2 sprays into the nose daily.      Historical Provider, MD  gabapentin (NEURONTIN) 100 MG capsule Take 100 mg by mouth at bedtime. 09/06/14   Historical Provider, MD  ketoconazole (NIZORAL) 2 % cream Apply 2 application topically as needed. rash 08/28/14   Historical Provider, MD  latanoprost (XALATAN) 0.005 % ophthalmic solution Place 1 drop into both eyes every evening.     Historical Provider, MD  levothyroxine (SYNTHROID, LEVOTHROID) 100 MCG tablet Take 50-100 mcg by mouth daily before breakfast. Takes 43mg on sun and mon Takes 105m all other days    Historical Provider, MD  metFORMIN (GLUCOPHAGE) 500 MG tablet Take 500 mg by mouth 2 (two) times daily with a meal.     Historical Provider, MD  ondansetron (ZOFRAN) 4 MG tablet Take 1 tablet (4 mg total) by mouth every 8 (eight) hours as needed for nausea or vomiting. 10/11/14   Brittney KeClaiborne BillingsPA-C  oxyCODONE-acetaminophen (PERCOCET) 5-325 MG per tablet Take one tablet by mouth every 4 hours as needed for moderate pain. Do not exceed 4gm of Tylenol in 24 hours 10/28/14   MoGildardo CrankerDO  OXYGEN Inhale 2.5 L into the lungs at bedtime. Only use oxygen at night    Historical Provider, MD  polyethylene glycol (MIRALAX / GLYCOLAX) packet Take 17 g by mouth daily.      Historical Provider, MD  rivaroxaban (XARELTO) 10 MG TABS tablet Take 1 tablet (10 mg total) by mouth daily. 10/11/14   Brittney KeClaiborne BillingsPA-C  saccharomyces boulardii (FLORASTOR) 250 MG capsule Take 250 mg by mouth 2 (two) times daily. X 2 weeks, ending on  11/01/14    Historical Provider, MD  tiZANidine (ZANAFLEX) 4 MG tablet Take 1 tablet (4 mg total) by mouth every 6 (six) hours as needed for muscle spasms. 10/11/14   Brittney Kelly, PA-C   BP 112/45 mmHg  Pulse 62  Temp(Src) 98.2 F (36.8 C) (Oral)  Resp 15  SpO2 92%   Physical Exam  Constitutional: He is oriented to  person, place, and time. He appears well-developed and well-nourished. No distress.  HENT:  Head: Normocephalic and atraumatic.  Mouth/Throat: Oropharynx is clear and moist.  Eyes: Conjunctivae and EOM are normal. Pupils are equal, round, and reactive to light.  Neck: Normal range of motion. Neck supple.  Cardiovascular: Normal rate, regular rhythm and normal heart sounds.   Pulmonary/Chest: Effort normal and breath sounds normal. No respiratory distress. He has no wheezes. He has no rhonchi. He exhibits tenderness.    Reproducible tenderness of left chest wall as depicted, no deformities or signs of trauma; lungs clear bilaterally  Abdominal: Soft. Bowel sounds are normal. There is no tenderness. There is no guarding.  Musculoskeletal: Normal range of motion.  Neurological: He is alert and oriented to person, place, and time.  Skin: Skin is warm and dry. He is not diaphoretic.  Psychiatric: He has a normal mood and affect.  Nursing note and vitals reviewed.   ED Course  Procedures (including critical care time) Labs Review Labs Reviewed  CBC WITH DIFFERENTIAL/PLATELET - Abnormal; Notable for the following:    WBC 17.1 (*)    RBC 3.90 (*)    Hemoglobin 10.8 (*)    HCT 36.6 (*)    MCHC 29.5 (*)    Neutro Abs 13.9 (*)    Monocytes Absolute 1.7 (*)    All other components within normal limits  BASIC METABOLIC PANEL - Abnormal; Notable for the following:    Potassium 6.5 (*)    Chloride 100 (*)    Glucose, Bld 119 (*)    BUN 24 (*)    All other components within normal limits  POTASSIUM - Abnormal; Notable for the following:    Potassium 5.8 (*)    All other  components within normal limits  I-STAT TROPOININ, ED    Imaging Review Dg Chest 2 View  02/10/2015  CLINICAL DATA:  Left-sided chest pain in the mornings for the last few days. EXAM: CHEST  2 VIEW COMPARISON:  04/12/2011 FINDINGS: Heart size is normal. There is atherosclerosis of the aorta. The right lung is clear. There is abnormal density in the left lower lobe posteriorly most consistent with bronchopneumonia. Follow-up to clearing recommended to ensure there is not a mass lesion. No effusion. No acute bone finding. IMPRESSION: Left lower lobe infiltrate most consistent with bronchopneumonia. Follow-up to clearing to rule out underlying mass. Electronically Signed   By: Nelson Chimes M.D.   On: 02/10/2015 15:12   I have personally reviewed and evaluated these images and lab results as part of my medical decision-making.   EKG Interpretation   Date/Time:  Friday February 10 2015 14:21:53 EST Ventricular Rate:  66 PR Interval:  158 QRS Duration: 85 QT Interval:  373 QTC Calculation: 391 R Axis:   81 Text Interpretation:  Sinus rhythm Borderline right axis deviation  Confirmed by Jeneen Rinks  MD, [place] ([id number]) on 02/10/2015 2:55:54 PM      MDM   Final diagnoses:  Hyperkalemia  CAP (community acquired pneumonia)  Chest pain, unspecified chest pain type   82 year old male here with left-sided chest pain. Patient is afebrile, nontoxic. His pain is reproducible on exam.  Patient does have a leukocytosis of 17K.  Initial K+ was hemolyzed at 6.5, however repeat without hemolysis is still elevated at 5.8.  Troponin negative, normal renal function.  CXR does reveal left lower lobe infiltrate.  This may be the source of his pain as well as a MSK component as it is reproducible on  exam.  Uncertain as to the source of patient's hyperkalemia.  He does have hx of same at 5.1- 5.2 on chart review, however has never been this high.  Case was discussed with [facility] hospitalist, Dr. Myna Hidalgo-- will admit to telemetry  observation.  Patient started on insulin, D50, Ca+ gluconate for hyperkalemia given peaked t-waves on EKG.  IV azithromycin and rocephin will be given after Ca+ gluconate to avoid reaction.  Larene Pickett, PA-C 02/10/15 [place], MD 02/11/15 [phone]

## 2015-02-10 NOTE — Care Management Note (Signed)
Case Management Note  Patient Details  Name: Taylor [name] MRN: [id number] Date of Birth: 1929/01/22  Subjective/Objective:         Patient presented to [facility] ED complaining CP           Action/Plan:    CM consulted concerning recommendations for [facility] services. CM met with patient and family in the ED explained that ED evaluation still pending, Spoke with patient and family pending no acute finding [place] services can be arranged, patient and family agreeable. Offered choice, previously had services with [facility] services. CM will continue to follow for discharge plan.  Expected Discharge Date:    02/10/15              Expected Discharge Plan:  [place]  In-House Referral:     Discharge planning Services  CM Consult  Post Acute Care Choice:    Choice offered to:   patient and family  DME Arranged:  N/A DME Agency:  NA  HH Arranged:  PT, OT HH Agency:  [place]  Status of Service:     Medicare Important Message Given:    Date Medicare IM Given:    Medicare IM give by:    Date Additional Medicare IM Given:    Additional Medicare Important Message give by:     If discussed at [place] of Stay Meetings, dates discussed:    Additional CommentsLaurena Slimmer, RN 02/10/2015, 4:43 PM

## 2015-02-10 NOTE — ED Notes (Signed)
Asked family to step back in the room and not stand out in the hallway due to it being a HIPPA violation.

## 2015-02-11 DIAGNOSIS — E875 Hyperkalemia: Secondary | ICD-10-CM

## 2015-02-11 DIAGNOSIS — E1100 Type 2 diabetes mellitus with hyperosmolarity without nonketotic hyperglycemic-hyperosmolar coma (NKHHC): Secondary | ICD-10-CM

## 2015-02-11 DIAGNOSIS — R079 Chest pain, unspecified: Secondary | ICD-10-CM

## 2015-02-11 DIAGNOSIS — I10 Essential (primary) hypertension: Secondary | ICD-10-CM

## 2015-02-11 DIAGNOSIS — J440 Chronic obstructive pulmonary disease with acute lower respiratory infection: Principal | ICD-10-CM

## 2015-02-11 DIAGNOSIS — J189 Pneumonia, unspecified organism: Secondary | ICD-10-CM

## 2015-02-11 LAB — NA AND K (SODIUM & POTASSIUM), RAND UR
Potassium Urine: 17 mmol/L
Sodium, Ur: 84 mmol/L

## 2015-02-11 LAB — BASIC METABOLIC PANEL
ANION GAP: 8 (ref 5–15)
ANION GAP: 8 (ref 5–15)
ANION GAP: 11 (ref 5–15)
BUN: 21 mg/dL — ABNORMAL HIGH (ref 6–20)
BUN: 19 mg/dL (ref 6–20)
BUN: 16 mg/dL (ref 6–20)
CALCIUM: 9 mg/dL (ref 8.9–10.3)
CALCIUM: 8.9 mg/dL (ref 8.9–10.3)
CALCIUM: 9.2 mg/dL (ref 8.9–10.3)
CO2: 31 mmol/L (ref 22–32)
CO2: 28 mmol/L (ref 22–32)
CO2: 27 mmol/L (ref 22–32)
Chloride: 99 mmol/L — ABNORMAL LOW (ref 101–111)
Chloride: 104 mmol/L (ref 101–111)
Chloride: 101 mmol/L (ref 101–111)
Creatinine, Ser: 0.74 mg/dL (ref 0.61–1.24)
Creatinine, Ser: 0.74 mg/dL (ref 0.61–1.24)
Creatinine, Ser: 0.76 mg/dL (ref 0.61–1.24)
GLUCOSE: 169 mg/dL — AB (ref 65–99)
Glucose, Bld: 116 mg/dL — ABNORMAL HIGH (ref 65–99)
Glucose, Bld: 132 mg/dL — ABNORMAL HIGH (ref 65–99)
POTASSIUM: 6 mmol/L — AB (ref 3.5–5.1)
POTASSIUM: 6.3 mmol/L — AB (ref 3.5–5.1)
POTASSIUM: 5 mmol/L (ref 3.5–5.1)
SODIUM: 140 mmol/L (ref 135–145)
Sodium: 138 mmol/L (ref 135–145)
Sodium: 139 mmol/L (ref 135–145)

## 2015-02-11 LAB — LACTIC ACID, PLASMA: LACTIC ACID, VENOUS: 1 mmol/L (ref 0.5–2.0)

## 2015-02-11 LAB — GLUCOSE, CAPILLARY
GLUCOSE-CAPILLARY: 225 mg/dL — AB (ref 65–99)
GLUCOSE-CAPILLARY: 111 mg/dL — AB (ref 65–99)
GLUCOSE-CAPILLARY: 176 mg/dL — AB (ref 65–99)
GLUCOSE-CAPILLARY: 156 mg/dL — AB (ref 65–99)

## 2015-02-11 LAB — CBC WITH DIFFERENTIAL/PLATELET
BASOS ABS: 0 10*3/uL (ref 0.0–0.1)
Basophils Relative: 0 %
EOS PCT: 1 %
Eosinophils Absolute: 0.2 10*3/uL (ref 0.0–0.7)
HEMATOCRIT: 35.7 % — AB (ref 39.0–52.0)
Hemoglobin: 10.5 g/dL — ABNORMAL LOW (ref 13.0–17.0)
LYMPHS ABS: 1.2 10*3/uL (ref 0.7–4.0)
LYMPHS PCT: 7 %
MCH: 27.5 pg (ref 26.0–34.0)
MCHC: 29.4 g/dL — ABNORMAL LOW (ref 30.0–36.0)
MCV: 93.5 fL (ref 78.0–100.0)
MONO ABS: 1.6 10*3/uL — AB (ref 0.1–1.0)
Monocytes Relative: 10 %
NEUTROS ABS: 13.2 10*3/uL — AB (ref 1.7–7.7)
Neutrophils Relative %: 82 %
Platelets: 232 10*3/uL (ref 150–400)
RBC: 3.82 MIL/uL — AB (ref 4.22–5.81)
RDW: 14.7 % (ref 11.5–15.5)
WBC: 16.1 10*3/uL — AB (ref 4.0–10.5)

## 2015-02-11 LAB — STREP PNEUMONIAE URINARY ANTIGEN: Strep Pneumo Urinary Antigen: NEGATIVE

## 2015-02-11 LAB — INFLUENZA PANEL BY PCR (TYPE A & B)
H1N1FLUPCR: NOT DETECTED
Influenza A By PCR: NEGATIVE
Influenza B By PCR: NEGATIVE

## 2015-02-11 LAB — OCCULT BLOOD X 1 CARD TO LAB, STOOL: Fecal Occult Bld: NEGATIVE

## 2015-02-11 LAB — MRSA PCR SCREENING: MRSA BY PCR: POSITIVE — AB

## 2015-02-11 LAB — HIV ANTIBODY (ROUTINE TESTING W REFLEX): HIV SCREEN 4TH GENERATION: NONREACTIVE

## 2015-02-11 MED ORDER — SODIUM CHLORIDE 0.9 % IV SOLN
1.0000 g | Freq: Once | INTRAVENOUS | Status: AC
  Administered 2015-02-11: 1 g via INTRAVENOUS
  Filled 2015-02-11: qty 10

## 2015-02-11 MED ORDER — DEXTROSE 50 % IV SOLN
1.0000 | Freq: Once | INTRAVENOUS | Status: AC
  Administered 2015-02-11: 50 mL via INTRAVENOUS
  Filled 2015-02-11: qty 50

## 2015-02-11 MED ORDER — SODIUM POLYSTYRENE SULFONATE 15 GM/60ML PO SUSP
15.0000 g | Freq: Once | ORAL | Status: AC
  Administered 2015-02-11: 15 g via ORAL
  Filled 2015-02-11: qty 60

## 2015-02-11 MED ORDER — INSULIN ASPART 100 UNIT/ML ~~LOC~~ SOLN
5.0000 [IU] | Freq: Once | SUBCUTANEOUS | Status: AC
  Administered 2015-02-11: 5 [IU] via INTRAVENOUS

## 2015-02-11 MED ORDER — GLUCERNA SHAKE PO LIQD
237.0000 mL | Freq: Two times a day (BID) | ORAL | Status: DC
  Administered 2015-02-11 – 2015-02-12 (×3): 237 mL via ORAL

## 2015-02-11 NOTE — Progress Notes (Signed)
[name]                                                                              Patient Demographics  Taylor [name], is a 82 y.o. male, DOB - Oct 18, 1928, TFT:[id number]  Admit date - 02/10/2015   Admitting Physician Vianne Bulls, MD  Outpatient Primary MD for the patient is [name], MD  LOS - 1   Chief Complaint  Patient presents with  . Chest Pain       Brief HPI   Taylor [name] is a 82 y.o. male with PMH of COPD on home O2 at night, paroxysmal atrial fibrillation on Zarrella toe, and hypothyroidism who presented to the ED with left-sided chest pain and dyspnea. Chest pain described as sharp, severe and intermittent, worse with deep breathing or cough in the left lateral chest wall. In ED, chest x-ray showed left lower lobe infiltrates. BMET showed potassium of 6.5 with normal serum creatinine.WBC 17, normocytic anemia   Patient was admitted for medical and pneumonia and hyperkalemia of unknown etiology.   Assessment & Plan    Principal Problem:  Community-acquired pneumonia with underlying history of COPD, chronic respiratory failure - Chest x-ray showed left lower lobe pneumonia - Patient was in skilled nursing facility after knee surgery for rehabilitation but DC'd on 10/3, so treating as CAP  - Continue IV Levaquin - Influenza panel negative, urine strep antigen negative.  - Pro calcitonin less than 0.1, lactic acid 1.0. - Follow blood cultures  Hyperkalemia  - K+ 6.5 on admission with peaked T waves - Etiology unclear at this time; no offending drugs on med list, no renal failure  - Currently 5.0 and improving - Follow urine studies, if BP allows, will place on HCTZ  Severe COPD with chronic respiratory failure - Using 2.5 Lpm supplemental O2 at night while home  - Continue duo nebs, wean O2 as tolerated  Type II DM  - Holding home metformin while inpt  - Continue sliding scale insulin, follow hemoglobin  A1c  Paroxysmal atrial fib  - Currently in sinus, Continue Xarelto  - Continue diltiazem, but given PNA and concern for developing sepsis, will convert to the short-acting tabs while inpt  - Monitor on telemetry  - CHADS vasc 2, patient was not on any anticoagulation prior to his left knee total arthroplasty and was placed on xarelto for DVT prophylaxis. For now we will keep him on the xarelto however, he may discuss with his primary cardiologist, Dr Acie Fredrickson, to stay on aspirin versus anti-coagulation  Hypothyroidism Continue Synthroid   Code Status: Full code   Family Communication: Discussed in detail with the patient, all imaging results, lab results explained to the patient   Disposition Plan:  Time Spent in minutes  25 minutes  Procedures  CXR  Consults   None   DVT Prophylaxis xarelto  Medications  Scheduled Meds: . atorvastatin  10 mg Oral Daily  . calcium-vitamin D  1 tablet Oral Q breakfast  . diltiazem  60 mg Oral 3 times per day  . DULoxetine  30 mg Oral QPM  . feeding supplement (GLUCERNA SHAKE)  237 mL Oral BID BM  . ferrous sulfate  325 mg Oral BID  . fluticasone  2 spray Each Nare Daily  . gabapentin  100 mg Oral QHS  . insulin aspart  0-15 Units Subcutaneous TID WC  . latanoprost  1 drop Both Eyes QPM  . levofloxacin (LEVAQUIN) IV  750 mg Intravenous Q24H  . levothyroxine  100 mcg Oral Once per day on Tue Wed Thu Fri Sat  . [START ON 02/12/2015] levothyroxine  50 mcg Oral Once per day on Sun Mon  . polyethylene glycol  17 g Oral Daily  . rivaroxaban  10 mg Oral Q supper   Continuous Infusions:  PRN Meds:.ALPRAZolam, ipratropium-albuterol, oxyCODONE-acetaminophen   Antibiotics   Anti-infectives    Start     Dose/Rate Route Frequency Ordered Stop   02/10/15 1945  levofloxacin (LEVAQUIN) IVPB 750 mg     750 mg 100 mL/hr over 90 Minutes Intravenous Every 24 hours 02/10/15 1935 02/15/15 1944   02/10/15 1900  cefTRIAXone (ROCEPHIN) 1 g in  dextrose 5 % 50 mL IVPB     1 g 100 mL/hr over 30 Minutes Intravenous  Once 02/10/15 1847 02/10/15 2021   02/10/15 1900  azithromycin (ZITHROMAX) 500 mg in dextrose 5 % 250 mL IVPB     500 mg 250 mL/hr over 60 Minutes Intravenous  Once 02/10/15 1847 02/10/15 2122        Subjective:   Taylor [name] was seen and examined today. Feeling better today, no chest pain. Patient denies dizziness, chest pain, shortness of breath, abdominal pain, N/V/D/C, new weakness, numbess, tingling. No acute events overnight.    Objective:   Blood pressure 123/56, pulse 72, temperature 97.6 F (36.4 C), temperature source Oral, resp. rate 18, height '5\' 7"'$  (1.702 m), weight 93.169 kg (205 lb 6.4 oz), SpO2 97 %.  Wt Readings from Last 3 Encounters:  02/11/15 93.169 kg (205 lb 6.4 oz)  10/11/14 97.523 kg (215 lb)  09/07/14 98.34 kg (216 lb 12.8 oz)     Intake/Output Summary (Last 24 hours) at 02/11/15 1100 Last data filed at 02/11/15 0751  Gross per 24 hour  Intake 1486.67 ml  Output   1525 ml  Net -38.33 ml    Exam  General: Alert and oriented x 3, NAD  HEENT:  PERRLA, EOMI, Anicteric Sclera, mucous membranes moist.   Neck: Supple, no JVD, no masses  CVS: S1 S2 auscultated, no rubs, murmurs or gallops. Regular rate and rhythm.  Respiratory:Decreased breath under the bases   Abdomen: Soft, nontender, nondistended, + bowel sounds  Ext: no cyanosis clubbing or edema  Neuro: AAOx3, Cr N's II- XII. Strength 5/5 upper and lower extremities bilaterally  Skin: No rashes  Psych: Normal affect and demeanor, alert and oriented x3    Data Review   Micro Results Recent Results (from the past 240 hour(s))  Culture, blood (routine x 2) Call MD if unable to obtain prior to antibiotics being given     Status: None (Preliminary result)   Collection Time: 02/10/15  9:55 PM  Result Value Ref Range Status   Specimen Description BLOOD LEFT ANTECUBITAL  Final   Special Requests BOTTLES DRAWN  AEROBIC AND ANAEROBIC 5CC  Final   Culture NO GROWTH < 12 HOURS  Final   Report Status PENDING  Incomplete  Culture, blood (routine x 2) Call MD if unable to obtain prior to antibiotics being given     Status: None (Preliminary result)   Collection Time: 02/10/15 10:02  PM  Result Value Ref Range Status   Specimen Description BLOOD RIGHT ARM  Final   Special Requests IN PEDIATRIC BOTTLE 1CC  Final   Culture NO GROWTH < 12 HOURS  Final   Report Status PENDING  Incomplete  MRSA PCR Screening     Status: Abnormal   Collection Time: 02/11/15  4:48 AM  Result Value Ref Range Status   MRSA by PCR POSITIVE (A) NEGATIVE Final    Comment:        The GeneXpert MRSA Assay (FDA approved for NASAL specimens only), is one component of a comprehensive MRSA colonization surveillance program. It is not intended to diagnose MRSA infection nor to guide or monitor treatment for MRSA infections. RESULT CALLED TO, READ BACK BY AND VERIFIED WITH: R HANEY '@0649'$  02/05/15 [facility]     Radiology Reports Dg Chest 2 View  02/10/2015  CLINICAL DATA:  Left-sided chest pain in the mornings for the last few days. EXAM: CHEST  2 VIEW COMPARISON:  04/12/2011 FINDINGS: Heart size is normal. There is atherosclerosis of the aorta. The right lung is clear. There is abnormal density in the left lower lobe posteriorly most consistent with bronchopneumonia. Follow-up to clearing recommended to ensure there is not a mass lesion. No effusion. No acute bone finding. IMPRESSION: Left lower lobe infiltrate most consistent with bronchopneumonia. Follow-up to clearing to rule out underlying mass. Electronically Signed   By: Nelson Chimes M.D.   On: 02/10/2015 15:12    CBC  Recent Labs Lab 02/10/15 1525 02/11/15 0323  WBC 17.1* 16.1*  HGB 10.8* 10.5*  HCT 36.6* 35.7*  PLT 265 232  MCV 93.8 93.5  MCH 27.7 27.5  MCHC 29.5* 29.4*  RDW 14.8 14.7  LYMPHSABS 1.4 1.2  MONOABS 1.7* 1.6*  EOSABS 0.2 0.2  BASOSABS 0.0 0.0     Chemistries   Recent Labs Lab 02/10/15 1525 02/10/15 1638 02/10/15 2155 02/10/15 2328 02/11/15 0323 02/11/15 0656  NA 138  --  137 138 140 139  K 6.5* 5.8* 5.0 6.0* 6.3* 5.0  CL 100*  --  98* 99* 104 101  CO2 30  --  '27 31 28 27  '$ GLUCOSE 119*  --  188* 169* 116* 132*  BUN 24*  --  20 21* 19 16  CREATININE 0.84  --  0.79 0.74 0.74 0.76  CALCIUM 9.1  --  9.1 9.0 8.9 9.2   ------------------------------------------------------------------------------------------------------------------ estimated creatinine clearance is 74.8 mL/min (by C-G formula based on Cr of 0.76). ------------------------------------------------------------------------------------------------------------------ No results for input(s): HGBA1C in the last 72 hours. ------------------------------------------------------------------------------------------------------------------ No results for input(s): CHOL, HDL, LDLCALC, TRIG, CHOLHDL, LDLDIRECT in the last 72 hours. ------------------------------------------------------------------------------------------------------------------  Recent Labs  02/10/15 2155  TSH 0.290*   ------------------------------------------------------------------------------------------------------------------ No results for input(s): VITAMINB12, FOLATE, FERRITIN, TIBC, IRON, RETICCTPCT in the last 72 hours.  Coagulation profile No results for input(s): INR, PROTIME in the last 168 hours.  No results for input(s): DDIMER in the last 72 hours.  Cardiac Enzymes No results for input(s): CKMB, TROPONINI, MYOGLOBIN in the last 168 hours.  Invalid input(s): CK ------------------------------------------------------------------------------------------------------------------ Invalid input(s): POCBNP   Recent Labs  02/10/15 2203 02/11/15 0613  GLUCAP 188* 225*     RAI,RIPUDEEP M.D. [name] 02/11/2015, 11:00 AM  Pager: [phone] Between 7am to 7pm - call Pager -  [phone]  After 7pm go to [email or web address] - password [email or web address]  Call night coverage person covering after 7pm

## 2015-02-11 NOTE — Progress Notes (Signed)
CRITICAL VALUE ALERT  Critical value received:  Potassium - 6.3  Date of notification:  02/11/2015  Time of notification:  7564  Critical value read back:Yes.    Nurse who received alert: Jannifer Rodney  MD notified (1st page):  Dr. Rogue Bussing via text page  Time of first page:  [phone]  MD notified (2nd page):n/a  Time of second page:n/a  Responding MD:  Dr. Rogue Bussing  Time MD responded:  [phone]

## 2015-02-11 NOTE — Progress Notes (Addendum)
Initial Nutrition Assessment  DOCUMENTATION CODES:   Obesity unspecified  INTERVENTION:    Glucerna Shake PO BID, each supplement provides 220 kcal and 10 grams of protein  NUTRITION DIAGNOSIS:   Predicted suboptimal nutrient intake related to chronic illness as evidenced by other (see comment) (20 lb weight loss over the past year).  GOAL:   Patient will meet greater than or equal to 90% of their needs  MONITOR:   PO intake, Supplement acceptance, Labs, Weight trends, I & O's  REASON FOR ASSESSMENT:   Consult COPD Protocol  ASSESSMENT:   82 y.o. male with PMH of COPD on home O2 at night, paroxysmal atrial fibrillation on Zarelto, and hypothyroidism who presents to the ED with left-sided chest pain and dyspnea. Patient admitted for ongoing evaluation and management of hyperkalemia and CAP.  Patient with ~20 lb weight loss over the past year. Suspect intake will be suboptimal with ongoing acute illness.   Diet Order:  Diet heart healthy/carb modified Room service appropriate?: Yes; Fluid consistency:: Thin  Skin:  Reviewed, no issues  Last BM:  1/6  Height:   Ht Readings from Last 1 Encounters:  02/11/15 '5\' 7"'$  (1.702 m)    Weight:   Wt Readings from Last 1 Encounters:  02/11/15 205 lb 6.4 oz (93.169 kg)    Ideal Body Weight:  67.3 kg  BMI:  Body mass index is 32.16 kg/(m^2).  Estimated Nutritional Needs:   Kcal:  1800-2000  Protein:  90-100 gm  Fluid:  1.8-2 L  EDUCATION NEEDS:   No education needs identified at this time  Molli Barrows, [place], [place], [place] Pager [phone] After Hours Pager [phone]

## 2015-02-11 NOTE — Evaluation (Signed)
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 Pt had a left total knee 4 months ago and is doing "great"   Clinical Impression  Pt is doing well mobility despite decrease in activity tolerance and intermittent left arm pain.  Expect he will be able to return to home at discharge with HHPT to increase exercise tolerance     Follow Up Recommendations Home health PT    Equipment Recommendations  None recommended by PT    Recommendations for Other Services OT consult     Precautions / Restrictions Precautions Precautions: Other (comment);Fall (contact ) Restrictions Weight Bearing Restrictions: No      Mobility  Bed Mobility               General bed mobility comments: pt sitting up in chair, states nursing helped him some   Transfers Overall transfer level: Needs assistance Equipment used: Rolling walker (2 wheeled)             General transfer comment: difficulty pushing up from low chair.  c/o some left arm pain with attmeps  Ambulation/Gait Ambulation/Gait assistance: Supervision Ambulation Distance (Feet): 60 Feet Assistive device: Rolling walker (2 wheeled) Gait Pattern/deviations: Step-through pattern;Trunk flexed Gait velocity: slow   General Gait Details: nasal O2 in place, no dyspnea noted   Stairs            Wheelchair Mobility    Modified Rankin (Stroke Patients Only)       Balance Overall balance assessment: Modified Independent         Standing balance support: No upper extremity supported Standing balance-Leahy Scale: Good                               Pertinent Vitals/Pain Pain Assessment: No/denies pain (doing better since he got his pain medicine )    Home Living Family/patient expects to be discharged to:: Private  residence Living Arrangements: Alone (daughtear works out of his home during the day ) Available Help at Discharge: Available PRN/intermittently Type of Home: House Home Access: Stairs to enter Entrance Stairs-Rails: Right Entrance Stairs-Number of Steps: 2 Home Layout: One level Home Equipment: Environmental consultant - 2 wheels;Cane - single point Additional Comments: pt uses a cane at home    Prior Function Level of Independence: Independent         Comments: pt drives to eat out all time     Hand Dominance        Extremity/Trunk Assessment               Lower Extremity Assessment: Overall WFL for tasks assessed (well healed left TKR incision with WNL ROM and strength )      Cervical / Trunk Assessment: Kyphotic  Communication   Communication: No difficulties  Cognition Arousal/Alertness: Awake/alert Behavior During Therapy: WFL for tasks assessed/performed Overall Cognitive Status: Within Functional Limits for tasks assessed                      General Comments      Exercises        Assessment/Plan    PT Assessment Patient needs continued PT services  PT Diagnosis Generalized weakness   PT Problem List Cardiopulmonary status limiting activity;Decreased activity tolerance  PT Treatment Interventions Gait training;Patient/family  education;Stair training;Therapeutic exercise;Therapeutic activities   PT Goals (Current goals can be found in the Care Plan section) Acute Rehab PT Goals Patient Stated Goal: to return home and have HHPT with [facility]  PT Goal Formulation: With patient Time For Goal Achievement: 02/25/15 Potential to Achieve Goals: Good    Frequency Min 3X/week   Barriers to discharge Decreased caregiver support pt alone at night     Co-evaluation               End of Session Equipment Utilized During Treatment: Oxygen Activity Tolerance: Patient tolerated treatment well Patient left: in chair;with call bell/phone within reach            Time: 1130-1150 PT Time Calculation (min) (ACUTE ONLY): 20 min   Charges:   PT Evaluation $PT Eval Moderate Complexity: 1 Procedure     PT G Codes:       Teresa K. Owens Shark, PT  02/11/2015, 12:05 PM

## 2015-02-11 NOTE — Progress Notes (Signed)
ANTIBIOTIC CONSULT NOTE - INITIAL  Pharmacy Consult for Antibiotic Renal dosing adjustment    Allergies  Allergen Reactions  . Amoxicillin Palpitations  . Motrin [Ibuprofen] Palpitations  . Chantix [Varenicline Tartrate] Other (See Comments)    depression    Patient Measurements: Weight: 205 lb 6.4 oz (93.169 kg)  Vital Signs: Temp: 97.6 F (36.4 C) (01/07 0751) Temp Source: Oral (01/07 0751) BP: 123/56 mmHg (01/07 0751) Pulse Rate: 72 (01/07 0751) Intake/Output from previous day: 01/06 0701 - 01/07 0700 In: 1486.7 [P.O.:480; I.V.:896.7; IV Piggyback:110] Out: 1250 [Urine:1250] Intake/Output from this shift: Total I/O In: -  Out: 275 [Urine:275]  Labs:  Recent Labs  02/10/15 1525 02/10/15 2155 02/10/15 2328 02/11/15 0323  WBC 17.1*  --   --  16.1*  HGB 10.8*  --   --  10.5*  PLT 265  --   --  232  CREATININE 0.84 0.79 0.74 0.74   Estimated Creatinine Clearance: 74.8 mL/min (by C-G formula based on Cr of 0.74). No results for input(s): VANCOTROUGH, VANCOPEAK, VANCORANDOM, GENTTROUGH, GENTPEAK, GENTRANDOM, TOBRATROUGH, TOBRAPEAK, TOBRARND, AMIKACINPEAK, AMIKACINTROU, AMIKACIN in the last 72 hours.   Microbiology: Recent Results (from the past 720 hour(s))  MRSA PCR Screening     Status: Abnormal   Collection Time: 02/11/15  4:48 AM  Result Value Ref Range Status   MRSA by PCR POSITIVE (A) NEGATIVE Final    Comment:        The GeneXpert MRSA Assay (FDA approved for NASAL specimens only), is one component of a comprehensive MRSA colonization surveillance program. It is not intended to diagnose MRSA infection nor to guide or monitor treatment for MRSA infections. RESULT CALLED TO, READ BACK BY AND VERIFIED WITH: R HANEY '@0649'$  02/05/15 MKELLY     Medical History: Past Medical History  Diagnosis Date  . Hypertension   . Hyperlipidemia   . Hypothyroidism   . COPD (chronic obstructive pulmonary disease) ([place])   . Atrial fibrillation ([place])   .  Glaucoma   . Anxiety   . History of bladder cancer   . Lung nodule     SMALL  . OSA (obstructive sleep apnea)   . Heart murmur   . Dysrhythmia     Hx of Atrial Flutter  . Deep vein blood clot of left lower extremity ([place])   . On home oxygen therapy     At night, during the day PRN 2.5L  . Cancer ([place])     bladder cancer, skin cancer  . Diabetes mellitus without complication (HCC)     Type 2  . Depression   . Kidney stones 2016  . Arthritis   . Osteoporosis      Assessment: 83 YOM admitted 02/10/2015 with Left-sided chest pain, SOB. Day #2 abx for CAP. WBC elevated at 16.1, LA 1, afebrile. SCr stable 0.74. CrCl ~86m/min.   Pharmacy consulted to provide renal dose adjustments for antibiotics. Recommend to continue levaquin '750mg'$  Q24h for 5 days (stop date 1/11).   1/6 CTX x1  1/6 Azithromycin x1  1/6 levaquin>> Stop date of 1/11  1/6 BCx x2>>sent  1/7 MRSA PCR - positive  Goal of Therapy:  Eradication of infection  Plan:  Continue levaquin '750mg'$  Q24h (stop date 1/11) F/U c/s  Alexiz Cothran C. MLennox Grumbles PharmD Pharmacy Resident  Pager: [phone]/08/2015 8:02 AM

## 2015-02-12 LAB — BASIC METABOLIC PANEL
Anion gap: 8 (ref 5–15)
BUN: 20 mg/dL (ref 6–20)
CHLORIDE: 98 mmol/L — AB (ref 101–111)
CO2: 30 mmol/L (ref 22–32)
CREATININE: 0.71 mg/dL (ref 0.61–1.24)
Calcium: 9 mg/dL (ref 8.9–10.3)
GFR calc Af Amer: >60 mL/min (ref >60)
GFR calc non Af Amer: >60 mL/min (ref >60)
GLUCOSE: 140 mg/dL — AB (ref 65–99)
POTASSIUM: 4.9 mmol/L (ref 3.5–5.1)
Sodium: 136 mmol/L (ref 135–145)

## 2015-02-12 LAB — PROCALCITONIN: Procalcitonin: <0.1 ng/mL

## 2015-02-12 LAB — CBC
HEMATOCRIT: 34.8 % — AB (ref 39.0–52.0)
HEMOGLOBIN: 10.4 g/dL — AB (ref 13.0–17.0)
MCH: 27.7 pg (ref 26.0–34.0)
MCHC: 29.9 g/dL — ABNORMAL LOW (ref 30.0–36.0)
MCV: 92.8 fL (ref 78.0–100.0)
PLATELETS: 229 10*3/uL (ref 150–400)
RBC: 3.75 MIL/uL — AB (ref 4.22–5.81)
RDW: 14.6 % (ref 11.5–15.5)
WBC: 15.7 10*3/uL — AB (ref 4.0–10.5)

## 2015-02-12 LAB — GLUCOSE, CAPILLARY
GLUCOSE-CAPILLARY: 135 mg/dL — AB (ref 65–99)
GLUCOSE-CAPILLARY: 127 mg/dL — AB (ref 65–99)
Glucose-Capillary: 128 mg/dL — ABNORMAL HIGH (ref 65–99)

## 2015-02-12 MED ORDER — HYDROCHLOROTHIAZIDE 12.5 MG PO CAPS
12.5000 mg | ORAL_CAPSULE | Freq: Every day | ORAL | Status: DC
  Administered 2015-02-12: 12.5 mg via ORAL
  Filled 2015-02-12: qty 1

## 2015-02-12 MED ORDER — HYDROCHLOROTHIAZIDE 12.5 MG PO CAPS: 12.5000 mg | ORAL_CAPSULE | Freq: Every day | ORAL | Status: DC

## 2015-02-12 MED ORDER — LEVOFLOXACIN 750 MG PO TABS: 750.0000 mg | ORAL_TABLET | Freq: Every day | ORAL | Status: DC

## 2015-02-12 MED ORDER — LEVOFLOXACIN 750 MG PO TABS
750.0000 mg | ORAL_TABLET | Freq: Every evening | ORAL | Status: DC
  Administered 2015-02-12: 750 mg via ORAL
  Filled 2015-02-12: qty 1

## 2015-02-12 NOTE — Progress Notes (Signed)
Case management contacted regarding home health needs

## 2015-02-12 NOTE — Progress Notes (Signed)
Patient requiring an ambulance home due to home O2 and O2 for transport per RN. Patient stable for DC. LCSW had RN to verify address and LCSW arranged transport.  Lane Hacker, MSW Clinical Social Work: Emergency Room [phone]

## 2015-02-12 NOTE — Discharge Summary (Signed)
Physician Discharge Summary   Patient ID: Taylor [name] MRN: [id number] DOB/AGE: [birth year] 82 y.o.  Admit date: 02/10/2015 Discharge date: 02/12/2015  Primary Care Physician:  [name], MD  Discharge Diagnoses:   . CAP (community acquired pneumonia) . Hyperkalemia  . paroxysmal atrial fibrillation  . Hypertension . COPD (chronic obstructive pulmonary disease) (HCC)   Diabetes mellitus   Chronic respiratory failure on home O2 2.5L at night   Consults:    Recommendations for Outpatient Follow-up:  1.  homehealth PT, RN arranged by the case management.  2. Please repeat CBC/BMET at next visit 3. Please note patient was started on hydrochlorothiazide 12.5 mg daily.   DIET:Carb modified diet   Allergies:   Allergies  Allergen Reactions  . Amoxicillin Palpitations  . Motrin [Ibuprofen] Palpitations  . Chantix [Varenicline Tartrate] Other (See Comments)    depression     DISCHARGE MEDICATIONS: Current Discharge Medication List    START taking these medications   Details  hydrochlorothiazide (MICROZIDE) 12.5 MG capsule Take 1 capsule (12.5 mg total) by mouth daily. Qty: 30 capsule, Refills: 2    levofloxacin (LEVAQUIN) 750 MG tablet Take 1 tablet (750 mg total) by mouth daily. X 7days Qty: 7 tablet, Refills: 0      CONTINUE these medications which have NOT CHANGED   Details  albuterol (PROVENTIL HFA;VENTOLIN HFA) 108 (90 BASE) MCG/ACT inhaler Inhale 2 puffs into the lungs every 4 (four) hours as needed for wheezing or shortness of breath.    albuterol (PROVENTIL) (2.5 MG/3ML) 0.083% nebulizer solution Take 2.5 mg by nebulization every 6 (six) hours as needed for shortness of breath.  Refills: 12    alendronate (FOSAMAX) 70 MG tablet Take 70 mg by mouth once a week. Monday Refills: 1    ALPRAZolam (XANAX) 0.25 MG tablet Take one tablet by mouth every night at bedtime as needed for anxiety Qty: 30 tablet, Refills: 5    atorvastatin (LIPITOR) 10 MG  tablet Take 10 mg by mouth daily.    Calcium Carbonate-Vitamin D (CALCIUM + D PO) Take 2 tablets by mouth daily.    diltiazem (CARDIZEM CD) 180 MG 24 hr capsule Take 180 mg by mouth daily.    DULoxetine (CYMBALTA) 30 MG capsule Take 30 mg by mouth every evening.     ferrous sulfate 325 (65 FE) MG EC tablet Take 325 mg by mouth 2 (two) times daily.    fluticasone (FLONASE) 50 MCG/ACT nasal spray Place 2 sprays into the nose daily.      gabapentin (NEURONTIN) 100 MG capsule Take 100 mg by mouth at bedtime.    ketoconazole (NIZORAL) 2 % cream Apply 2 application topically as needed. rash Refills: 3    latanoprost (XALATAN) 0.005 % ophthalmic solution Place 1 drop into both eyes every evening.     levothyroxine (SYNTHROID, LEVOTHROID) 100 MCG tablet Take 50-100 mcg by mouth daily before breakfast. Takes 71mg on sun and mon Takes 1039m all other days    metFORMIN (GLUCOPHAGE) 500 MG tablet Take 500 mg by mouth daily.     oxyCODONE-acetaminophen (PERCOCET) 5-325 MG per tablet Take one tablet by mouth every 4 hours as needed for moderate pain. Do not exceed 4gm of Tylenol in 24 hours Qty: 180 tablet, Refills: 0    OXYGEN Inhale 2.5 L into the lungs at bedtime. Only use oxygen at night    polyethylene glycol (MIRALAX / GLYCOLAX) packet Take 17 g by mouth daily.      rivaroxaban (XARELTO) 10 MG  TABS tablet Take 1 tablet (10 mg total) by mouth daily. Qty: 30 tablet, Refills: 0      STOP taking these medications     ondansetron (ZOFRAN) 4 MG tablet      tiZANidine (ZANAFLEX) 4 MG tablet          Brief H and P: For complete details please refer to admission H and P, but in brief Taylor [name] is a 82 y.o. male with PMH of COPD on home O2 at night, paroxysmal atrial fibrillation on Zarrella toe, and hypothyroidism who presented to the ED with left-sided chest pain and dyspnea. Chest pain described as sharp, severe and intermittent, worse with deep breathing or cough in the left  lateral chest wall. In ED, chest x-ray showed left lower lobe infiltrates. BMET showed potassium of 6.5 with normal serum creatinine.WBC 17, normocytic anemia  Patient was admitted for medical and pneumonia and hyperkalemia of unknown etiology.  Hospital Course:  Community-acquired pneumonia with underlying history of COPD, chronic respiratory failure - Chest x-ray showed left lower lobe pneumonia - Patient was in skilled nursing facility after knee surgery for rehabilitation but [facility] on 10/3, so treated as CAP  - Patient was placed on IV Levaquin and transitioned to oral antibiotics prior to discharge for 7 days - Influenza panel negative, urine strep antigen negative.  - Pro calcitonin less than 0.1, lactic acid 1.0. - Blood cultures remain negative to date. - Please obtain chest x-ray in 2-3 weeks to ensure complete resolution of pneumonia   Hyperkalemia  - K+ 6.5 on admission with peaked T waves.  Etiology unclear at this time; no offending drugs on med list, no renal failure. Urine studies normal.  -  potassium 4.9 at the time of discharge. Placed on low-dose of hydrochlorothiazide 12.5 mg daily. Please check BMET at the time of appointment.   Severe COPD with chronic respiratory failure - Using 2.5 Lpm supplemental O2 at night while home  - Continue duo nebs, wean O2 as tolerated  Type II DM  - Holding home metformin while inpt  - Continue sliding scale insulin, follow hemoglobin A1c  Paroxysmal atrial fib  - Currently in sinus, Continue Xarelto  -  patient was continued on diltiazem.  - CHADS vasc 2, patient was not on any anticoagulation prior to his left knee total arthroplasty and was placed on xarelto for DVT prophylaxis. Patient was continued on xarelto while inpatient however, he may discuss with his primary cardiologist, Dr Acie Fredrickson, whether to stay on aspirin versus anti-coagulation  Hypothyroidism Continue Synthroid   Day of Discharge BP 132/66 mmHg   Pulse 82  Temp(Src) 98 F (36.7 C) (Oral)  Resp 18  Ht '5\' 7"'$  (1.702 m)  Wt 91.082 kg (200 lb 12.8 oz)  BMI 31.44 kg/m2  SpO2 98%  Physical Exam: General: Alert and awake oriented x3 not in any acute distress. HEENT: anicteric sclera, pupils reactive to light and accommodation CVS: S1-S2 clear no murmur rubs or gallops Chest: clear to auscultation bilaterally, no wheezing rales or rhonchi Abdomen: soft nontender, nondistended, normal bowel sounds Extremities: no cyanosis, clubbing or edema noted bilaterally Neuro: Cranial nerves II-XII intact, no focal neurological deficits   The results of significant diagnostics from this hospitalization (including imaging, microbiology, ancillary and laboratory) are listed below for reference.    LAB RESULTS: Basic Metabolic Panel:  Recent Labs Lab 02/11/15 0656 02/12/15 0700  NA 139 136  K 5.0 4.9  CL 101 98*  CO2 27 30  GLUCOSE 132* 140*  BUN 16 20  CREATININE 0.76 0.71  CALCIUM 9.2 9.0   Liver Function Tests: No results for input(s): AST, ALT, ALKPHOS, BILITOT, PROT, ALBUMIN in the last 168 hours. No results for input(s): LIPASE, AMYLASE in the last 168 hours. No results for input(s): AMMONIA in the last 168 hours. CBC:  Recent Labs Lab 02/11/15 0323 02/12/15 0700  WBC 16.1* 15.7*  NEUTROABS 13.2*  --   HGB 10.5* 10.4*  HCT 35.7* 34.8*  MCV 93.5 92.8  PLT 232 229   Cardiac Enzymes: No results for input(s): CKTOTAL, CKMB, CKMBINDEX, TROPONINI in the last 168 hours. BNP: Invalid input(s): POCBNP CBG:  Recent Labs Lab 02/11/15 2043 02/12/15 0719  GLUCAP 156* 135*    Significant Diagnostic Studies:  Dg Chest 2 View  02/10/2015  CLINICAL DATA:  Left-sided chest pain in the mornings for the last few days. EXAM: CHEST  2 VIEW COMPARISON:  04/12/2011 FINDINGS: Heart size is normal. There is atherosclerosis of the aorta. The right lung is clear. There is abnormal density in the left lower lobe posteriorly most  consistent with bronchopneumonia. Follow-up to clearing recommended to ensure there is not a mass lesion. No effusion. No acute bone finding. IMPRESSION: Left lower lobe infiltrate most consistent with bronchopneumonia. Follow-up to clearing to rule out underlying mass. Electronically Signed   By: Nelson Chimes M.D.   On: 02/10/2015 15:12    2D ECHO:   Disposition and Follow-up: Discharge Instructions    Diet Carb Modified    Complete by:  As directed      Increase activity slowly    Complete by:  As directed             DISPOSITION: Home with home health    DISCHARGE FOLLOW-UP Follow-up Information    Follow up with [facility].   Why:  Home Health PT/OT/RN/Aide will contact you 24-48 hours post discharge to arrange Initial visit   Contact information:   [address] [place] [address] [phone]       Follow up with [name], MD. Schedule an appointment as soon as possible for a visit in 10 days.   Specialty:  Endocrinology   Why:  for hospital follow-up, obtain labs for BMET (potassium and renal function)   Contact information:   [address] [place] [address] [phone]        Time spent on Discharge: 35 mins   Signed:   Darlette Dubow M.D. [facility] 02/12/2015, 11:45 AM Pager: [phone]

## 2015-02-12 NOTE — Care Management Note (Signed)
Case Management Note  Patient Details  Name: Taylor [name] MRN: [id number] Date of Birth: 07-04-1928  Subjective/Objective: 82 y.o. M admitted 02/10/2015  With CP.                   Action/Plan: Anticipate discharge home today with HHRN, PT, OT, and [place] aide provided by [place].No further CM needs but will be available should additional discharge needs arise.   Expected Discharge Date:                  Expected Discharge Plan:  [place]  In-House Referral:     Discharge planning Services  CM Consult  Post Acute Care Choice:    Choice offered to:     DME Arranged:  N/A DME Agency:  NA  HH Arranged:  PT, OT, RN, Nurse's Aide HH Agency:  [facility]  Status of Service:  Completed, signed off  Medicare Important Message Given:    Date Medicare IM Given:    Medicare IM give by:    Date Additional Medicare IM Given:    Additional Medicare Important Message give by:     If discussed at [place] of Stay Meetings, dates discussed:    Additional Comments:  Delrae Sawyers, RN 02/12/2015, 11:44 AM

## 2015-02-13 LAB — LEGIONELLA ANTIGEN, URINE

## 2015-02-13 LAB — HEMOGLOBIN A1C
Hgb A1c MFr Bld: 7 % — ABNORMAL HIGH (ref 4.8–5.6)
Mean Plasma Glucose: 154 mg/dL

## 2015-02-14 ENCOUNTER — Other Ambulatory Visit (HOSPITAL_COMMUNITY): Payer: Medicare Other

## 2015-02-14 ENCOUNTER — Emergency Department (HOSPITAL_COMMUNITY): Payer: Medicare Other

## 2015-02-14 ENCOUNTER — Encounter (HOSPITAL_COMMUNITY): Payer: Self-pay

## 2015-02-14 ENCOUNTER — Inpatient Hospital Stay (HOSPITAL_COMMUNITY)
Admission: EM | Admit: 2015-02-14 | Discharge: 2015-02-24 | DRG: 853 | Disposition: A | Discharge status: Skilled Nursing Facility | Payer: Medicare Other | Attending: Internal Medicine | Admitting: Internal Medicine

## 2015-02-14 ENCOUNTER — Inpatient Hospital Stay (HOSPITAL_COMMUNITY): Payer: Medicare Other

## 2015-02-14 DIAGNOSIS — E278 Other specified disorders of adrenal gland: Secondary | ICD-10-CM

## 2015-02-14 DIAGNOSIS — Z79899 Other long term (current) drug therapy: Secondary | ICD-10-CM | POA: Diagnosis not present

## 2015-02-14 DIAGNOSIS — C7951 Secondary malignant neoplasm of bone: Secondary | ICD-10-CM | POA: Diagnosis present

## 2015-02-14 DIAGNOSIS — I959 Hypotension, unspecified: Secondary | ICD-10-CM | POA: Diagnosis present

## 2015-02-14 DIAGNOSIS — Z7983 Long term (current) use of bisphosphonates: Secondary | ICD-10-CM | POA: Diagnosis not present

## 2015-02-14 DIAGNOSIS — E860 Dehydration: Secondary | ICD-10-CM | POA: Diagnosis present

## 2015-02-14 DIAGNOSIS — E279 Disorder of adrenal gland, unspecified: Secondary | ICD-10-CM | POA: Diagnosis not present

## 2015-02-14 DIAGNOSIS — F419 Anxiety disorder, unspecified: Secondary | ICD-10-CM | POA: Diagnosis present

## 2015-02-14 DIAGNOSIS — Z7984 Long term (current) use of oral hypoglycemic drugs: Secondary | ICD-10-CM

## 2015-02-14 DIAGNOSIS — R571 Hypovolemic shock: Secondary | ICD-10-CM | POA: Diagnosis present

## 2015-02-14 DIAGNOSIS — I480 Paroxysmal atrial fibrillation: Secondary | ICD-10-CM | POA: Diagnosis present

## 2015-02-14 DIAGNOSIS — D3500 Benign neoplasm of unspecified adrenal gland: Secondary | ICD-10-CM | POA: Diagnosis present

## 2015-02-14 DIAGNOSIS — R079 Chest pain, unspecified: Secondary | ICD-10-CM | POA: Diagnosis not present

## 2015-02-14 DIAGNOSIS — Z8551 Personal history of malignant neoplasm of bladder: Secondary | ICD-10-CM

## 2015-02-14 DIAGNOSIS — R531 Weakness: Secondary | ICD-10-CM | POA: Insufficient documentation

## 2015-02-14 DIAGNOSIS — M810 Age-related osteoporosis without current pathological fracture: Secondary | ICD-10-CM | POA: Diagnosis present

## 2015-02-14 DIAGNOSIS — M4854XA Collapsed vertebra, not elsewhere classified, thoracic region, initial encounter for fracture: Secondary | ICD-10-CM | POA: Diagnosis present

## 2015-02-14 DIAGNOSIS — J441 Chronic obstructive pulmonary disease with (acute) exacerbation: Secondary | ICD-10-CM | POA: Diagnosis present

## 2015-02-14 DIAGNOSIS — K922 Gastrointestinal hemorrhage, unspecified: Secondary | ICD-10-CM | POA: Diagnosis not present

## 2015-02-14 DIAGNOSIS — D72829 Elevated white blood cell count, unspecified: Secondary | ICD-10-CM | POA: Diagnosis not present

## 2015-02-14 DIAGNOSIS — R5381 Other malaise: Secondary | ICD-10-CM | POA: Diagnosis not present

## 2015-02-14 DIAGNOSIS — J189 Pneumonia, unspecified organism: Secondary | ICD-10-CM | POA: Diagnosis present

## 2015-02-14 DIAGNOSIS — I951 Orthostatic hypotension: Secondary | ICD-10-CM | POA: Diagnosis present

## 2015-02-14 DIAGNOSIS — Z7901 Long term (current) use of anticoagulants: Secondary | ICD-10-CM | POA: Diagnosis not present

## 2015-02-14 DIAGNOSIS — D649 Anemia, unspecified: Secondary | ICD-10-CM | POA: Diagnosis not present

## 2015-02-14 DIAGNOSIS — G4733 Obstructive sleep apnea (adult) (pediatric): Secondary | ICD-10-CM | POA: Diagnosis present

## 2015-02-14 DIAGNOSIS — H409 Unspecified glaucoma: Secondary | ICD-10-CM | POA: Diagnosis present

## 2015-02-14 DIAGNOSIS — I10 Essential (primary) hypertension: Secondary | ICD-10-CM | POA: Diagnosis present

## 2015-02-14 DIAGNOSIS — M79605 Pain in left leg: Secondary | ICD-10-CM

## 2015-02-14 DIAGNOSIS — W19XXXD Unspecified fall, subsequent encounter: Secondary | ICD-10-CM | POA: Diagnosis not present

## 2015-02-14 DIAGNOSIS — A408 Other streptococcal sepsis: Secondary | ICD-10-CM | POA: Diagnosis not present

## 2015-02-14 DIAGNOSIS — Z85828 Personal history of other malignant neoplasm of skin: Secondary | ICD-10-CM

## 2015-02-14 DIAGNOSIS — Z86718 Personal history of other venous thrombosis and embolism: Secondary | ICD-10-CM | POA: Diagnosis not present

## 2015-02-14 DIAGNOSIS — J9610 Chronic respiratory failure, unspecified whether with hypoxia or hypercapnia: Secondary | ICD-10-CM | POA: Diagnosis present

## 2015-02-14 DIAGNOSIS — Z87891 Personal history of nicotine dependence: Secondary | ICD-10-CM | POA: Diagnosis not present

## 2015-02-14 DIAGNOSIS — E039 Hypothyroidism, unspecified: Secondary | ICD-10-CM | POA: Diagnosis present

## 2015-02-14 DIAGNOSIS — C3490 Malignant neoplasm of unspecified part of unspecified bronchus or lung: Secondary | ICD-10-CM | POA: Diagnosis present

## 2015-02-14 DIAGNOSIS — J440 Chronic obstructive pulmonary disease with acute lower respiratory infection: Secondary | ICD-10-CM | POA: Diagnosis present

## 2015-02-14 DIAGNOSIS — J438 Other emphysema: Secondary | ICD-10-CM | POA: Diagnosis not present

## 2015-02-14 DIAGNOSIS — M899 Disorder of bone, unspecified: Secondary | ICD-10-CM | POA: Insufficient documentation

## 2015-02-14 DIAGNOSIS — Z8659 Personal history of other mental and behavioral disorders: Secondary | ICD-10-CM | POA: Diagnosis not present

## 2015-02-14 DIAGNOSIS — F329 Major depressive disorder, single episode, unspecified: Secondary | ICD-10-CM | POA: Diagnosis present

## 2015-02-14 DIAGNOSIS — K921 Melena: Secondary | ICD-10-CM | POA: Diagnosis not present

## 2015-02-14 DIAGNOSIS — E785 Hyperlipidemia, unspecified: Secondary | ICD-10-CM | POA: Diagnosis present

## 2015-02-14 DIAGNOSIS — M545 Low back pain, unspecified: Secondary | ICD-10-CM | POA: Diagnosis present

## 2015-02-14 DIAGNOSIS — A419 Sepsis, unspecified organism: Principal | ICD-10-CM | POA: Diagnosis present

## 2015-02-14 DIAGNOSIS — Z515 Encounter for palliative care: Secondary | ICD-10-CM | POA: Diagnosis not present

## 2015-02-14 DIAGNOSIS — E119 Type 2 diabetes mellitus without complications: Secondary | ICD-10-CM

## 2015-02-14 DIAGNOSIS — W19XXXA Unspecified fall, initial encounter: Secondary | ICD-10-CM

## 2015-02-14 DIAGNOSIS — Z66 Do not resuscitate: Secondary | ICD-10-CM | POA: Diagnosis not present

## 2015-02-14 DIAGNOSIS — Z7401 Bed confinement status: Secondary | ICD-10-CM | POA: Diagnosis not present

## 2015-02-14 DIAGNOSIS — M549 Dorsalgia, unspecified: Secondary | ICD-10-CM

## 2015-02-14 DIAGNOSIS — Z96652 Presence of left artificial knee joint: Secondary | ICD-10-CM | POA: Diagnosis present

## 2015-02-14 DIAGNOSIS — D62 Acute posthemorrhagic anemia: Secondary | ICD-10-CM | POA: Diagnosis not present

## 2015-02-14 DIAGNOSIS — Z7951 Long term (current) use of inhaled steroids: Secondary | ICD-10-CM

## 2015-02-14 DIAGNOSIS — J9611 Chronic respiratory failure with hypoxia: Secondary | ICD-10-CM | POA: Diagnosis not present

## 2015-02-14 HISTORY — DX: Pneumonia, unspecified organism: J18.9

## 2015-02-14 LAB — CBC WITH DIFFERENTIAL/PLATELET
BASOS ABS: 0 10*3/uL (ref 0.0–0.1)
BASOS PCT: 0 %
BASOS PCT: 0 %
Basophils Absolute: 0 10*3/uL (ref 0.0–0.1)
EOS ABS: 0.1 10*3/uL (ref 0.0–0.7)
EOS PCT: 0 %
Eosinophils Absolute: 0 10*3/uL (ref 0.0–0.7)
Eosinophils Relative: 0 %
HCT: 27.9 % — ABNORMAL LOW (ref 39.0–52.0)
HEMATOCRIT: 28.6 % — AB (ref 39.0–52.0)
HEMOGLOBIN: 8.4 g/dL — AB (ref 13.0–17.0)
Hemoglobin: 8.6 g/dL — ABNORMAL LOW (ref 13.0–17.0)
LYMPHS ABS: 1.3 10*3/uL (ref 0.7–4.0)
Lymphocytes Relative: 7 %
Lymphocytes Relative: 6 %
Lymphs Abs: 1.1 10*3/uL (ref 0.7–4.0)
MCH: 27.4 pg (ref 26.0–34.0)
MCH: 27.3 pg (ref 26.0–34.0)
MCHC: 30.1 g/dL (ref 30.0–36.0)
MCHC: 30.1 g/dL (ref 30.0–36.0)
MCV: 90.9 fL (ref 78.0–100.0)
MCV: 90.8 fL (ref 78.0–100.0)
MONO ABS: 2 10*3/uL — AB (ref 0.1–1.0)
MONO ABS: 1.7 10*3/uL — AB (ref 0.1–1.0)
MONOS PCT: 11 %
Monocytes Relative: 9 %
NEUTROS ABS: 14.8 10*3/uL — AB (ref 1.7–7.7)
NEUTROS PCT: 82 %
NEUTROS PCT: 85 %
Neutro Abs: 14.6 10*3/uL — ABNORMAL HIGH (ref 1.7–7.7)
PLATELETS: 236 10*3/uL (ref 150–400)
PLATELETS: 204 10*3/uL (ref 150–400)
RBC: 3.07 MIL/uL — ABNORMAL LOW (ref 4.22–5.81)
RBC: 3.15 MIL/uL — ABNORMAL LOW (ref 4.22–5.81)
RDW: 15 % (ref 11.5–15.5)
RDW: 15 % (ref 11.5–15.5)
WBC: 17.9 10*3/uL — ABNORMAL HIGH (ref 4.0–10.5)
WBC: 17.5 10*3/uL — ABNORMAL HIGH (ref 4.0–10.5)

## 2015-02-14 LAB — I-STAT CHEM 8, ED
BUN: 66 mg/dL — AB (ref 6–20)
CALCIUM ION: 1.09 mmol/L — AB (ref 1.13–1.30)
Chloride: 98 mmol/L — ABNORMAL LOW (ref 101–111)
Creatinine, Ser: 1.9 mg/dL — ABNORMAL HIGH (ref 0.61–1.24)
Glucose, Bld: 116 mg/dL — ABNORMAL HIGH (ref 65–99)
HEMATOCRIT: 27 % — AB (ref 39.0–52.0)
HEMOGLOBIN: 9.2 g/dL — AB (ref 13.0–17.0)
Potassium: 5.1 mmol/L (ref 3.5–5.1)
SODIUM: 134 mmol/L — AB (ref 135–145)
TCO2: 31 mmol/L (ref 0–100)

## 2015-02-14 LAB — URINALYSIS, ROUTINE W REFLEX MICROSCOPIC
BILIRUBIN URINE: NEGATIVE
Bilirubin Urine: NEGATIVE
GLUCOSE, UA: NEGATIVE mg/dL
Glucose, UA: NEGATIVE mg/dL
HGB URINE DIPSTICK: NEGATIVE
Hgb urine dipstick: NEGATIVE
KETONES UR: NEGATIVE mg/dL
Ketones, ur: NEGATIVE mg/dL
LEUKOCYTES UA: NEGATIVE
Leukocytes, UA: NEGATIVE
NITRITE: NEGATIVE
Nitrite: NEGATIVE
PH: 5 (ref 5.0–8.0)
PROTEIN: NEGATIVE mg/dL
Protein, ur: NEGATIVE mg/dL
SPECIFIC GRAVITY, URINE: 1.015 (ref 1.005–1.030)
SPECIFIC GRAVITY, URINE: 1.012 (ref 1.005–1.030)
pH: 5 (ref 5.0–8.0)

## 2015-02-14 LAB — PROTIME-INR
INR: 1.42 (ref 0.00–1.49)
PROTHROMBIN TIME: 17.4 s — AB (ref 11.6–15.2)

## 2015-02-14 LAB — COMPREHENSIVE METABOLIC PANEL
ALBUMIN: 2.1 g/dL — AB (ref 3.5–5.0)
ALK PHOS: 70 U/L (ref 38–126)
ALT: 21 U/L (ref 17–63)
ALT: 19 U/L (ref 17–63)
AST: 21 U/L (ref 15–41)
AST: 22 U/L (ref 15–41)
Albumin: 2.2 g/dL — ABNORMAL LOW (ref 3.5–5.0)
Alkaline Phosphatase: 78 U/L (ref 38–126)
Anion gap: 11 (ref 5–15)
Anion gap: 10 (ref 5–15)
BILIRUBIN TOTAL: 0.7 mg/dL (ref 0.3–1.2)
BUN: 59 mg/dL — AB (ref 6–20)
BUN: 43 mg/dL — AB (ref 6–20)
CALCIUM: 8 mg/dL — AB (ref 8.9–10.3)
CHLORIDE: 99 mmol/L — AB (ref 101–111)
CHLORIDE: 105 mmol/L (ref 101–111)
CO2: 26 mmol/L (ref 22–32)
CO2: 25 mmol/L (ref 22–32)
CREATININE: 2.03 mg/dL — AB (ref 0.61–1.24)
Calcium: 7.4 mg/dL — ABNORMAL LOW (ref 8.9–10.3)
Creatinine, Ser: 1.31 mg/dL — ABNORMAL HIGH (ref 0.61–1.24)
GFR calc Af Amer: 56 mL/min — ABNORMAL LOW (ref >60)
GFR calc non Af Amer: 28 mL/min — ABNORMAL LOW (ref >60)
GFR calc non Af Amer: 48 mL/min — ABNORMAL LOW (ref >60)
GFR, EST AFRICAN AMERICAN: 33 mL/min — AB (ref >60)
GLUCOSE: 117 mg/dL — AB (ref 65–99)
Glucose, Bld: 120 mg/dL — ABNORMAL HIGH (ref 65–99)
POTASSIUM: 4.2 mmol/L (ref 3.5–5.1)
Potassium: 4.9 mmol/L (ref 3.5–5.1)
SODIUM: 136 mmol/L (ref 135–145)
Sodium: 140 mmol/L (ref 135–145)
Total Bilirubin: 0.3 mg/dL (ref 0.3–1.2)
Total Protein: 5.7 g/dL — ABNORMAL LOW (ref 6.5–8.1)
Total Protein: 5.7 g/dL — ABNORMAL LOW (ref 6.5–8.1)

## 2015-02-14 LAB — PROCALCITONIN: Procalcitonin: <0.1 ng/mL

## 2015-02-14 LAB — I-STAT CG4 LACTIC ACID, ED: Lactic Acid, Venous: 2.86 mmol/L (ref 0.5–2.0)

## 2015-02-14 LAB — MAGNESIUM: Magnesium: 1.7 mg/dL (ref 1.7–2.4)

## 2015-02-14 LAB — TROPONIN I
Troponin I: <0.03 ng/mL (ref <0.031)
Troponin I: <0.03 ng/mL (ref <0.031)

## 2015-02-14 LAB — CORTISOL: Cortisol, Plasma: 14.5 ug/dL

## 2015-02-14 LAB — APTT: APTT: 23 s — AB (ref 24–37)

## 2015-02-14 LAB — LACTIC ACID, PLASMA: Lactic Acid, Venous: 1.6 mmol/L (ref 0.5–2.0)

## 2015-02-14 MED ORDER — DEXTROSE 5 % IV SOLN
2.0000 g | Freq: Once | INTRAVENOUS | Status: AC
  Administered 2015-02-14: 2 g via INTRAVENOUS
  Filled 2015-02-14: qty 2

## 2015-02-14 MED ORDER — INSULIN ASPART 100 UNIT/ML ~~LOC~~ SOLN
0.0000 [IU] | Freq: Three times a day (TID) | SUBCUTANEOUS | Status: DC
  Administered 2015-02-17: 2 [IU] via SUBCUTANEOUS
  Administered 2015-02-17: 1 [IU] via SUBCUTANEOUS
  Administered 2015-02-17 – 2015-02-18 (×2): 2 [IU] via SUBCUTANEOUS
  Administered 2015-02-18: 1 [IU] via SUBCUTANEOUS
  Administered 2015-02-18: 2 [IU] via SUBCUTANEOUS
  Administered 2015-02-19 – 2015-02-20 (×4): 1 [IU] via SUBCUTANEOUS
  Administered 2015-02-20: 2 [IU] via SUBCUTANEOUS
  Administered 2015-02-21: 1 [IU] via SUBCUTANEOUS
  Administered 2015-02-21 – 2015-02-22 (×3): 2 [IU] via SUBCUTANEOUS
  Administered 2015-02-22: 1 [IU] via SUBCUTANEOUS
  Administered 2015-02-23 (×2): 2 [IU] via SUBCUTANEOUS
  Administered 2015-02-24: 1 [IU] via SUBCUTANEOUS

## 2015-02-14 MED ORDER — SODIUM CHLORIDE 0.9 % IV SOLN
INTRAVENOUS | Status: DC
  Administered 2015-02-14 – 2015-02-19 (×4): via INTRAVENOUS
  Administered 2015-02-19: 1000 mL via INTRAVENOUS

## 2015-02-14 MED ORDER — ACETAMINOPHEN 650 MG RE SUPP
650.0000 mg | Freq: Four times a day (QID) | RECTAL | Status: DC | PRN
Start: 2015-02-14 — End: 2015-02-24

## 2015-02-14 MED ORDER — LEVALBUTEROL HCL 0.63 MG/3ML IN NEBU
0.6300 mg | INHALATION_SOLUTION | Freq: Four times a day (QID) | RESPIRATORY_TRACT | Status: DC | PRN
  Filled 2015-02-14 (×2): qty 3

## 2015-02-14 MED ORDER — VANCOMYCIN HCL IN DEXTROSE 1-5 GM/200ML-% IV SOLN
1000.0000 mg | Freq: Two times a day (BID) | INTRAVENOUS | Status: DC
  Administered 2015-02-15 – 2015-02-22 (×16): 1000 mg via INTRAVENOUS
  Filled 2015-02-14 (×17): qty 200

## 2015-02-14 MED ORDER — SODIUM CHLORIDE 0.9 % IV BOLUS (SEPSIS)
1000.0000 mL | Freq: Once | INTRAVENOUS | Status: AC
  Administered 2015-02-14: 1000 mL via INTRAVENOUS

## 2015-02-14 MED ORDER — ONDANSETRON HCL 4 MG/2ML IJ SOLN
4.0000 mg | Freq: Four times a day (QID) | INTRAMUSCULAR | Status: DC | PRN
  Administered 2015-02-15: 4 mg via INTRAVENOUS
  Filled 2015-02-14: qty 2

## 2015-02-14 MED ORDER — SODIUM CHLORIDE 0.9 % IV BOLUS (SEPSIS)
500.0000 mL | Freq: Once | INTRAVENOUS | Status: AC
  Administered 2015-02-14: 500 mL via INTRAVENOUS

## 2015-02-14 MED ORDER — ACETAMINOPHEN 325 MG PO TABS
650.0000 mg | ORAL_TABLET | Freq: Four times a day (QID) | ORAL | Status: DC | PRN
Start: 2015-02-14 — End: 2015-02-24
  Administered 2015-02-17: 650 mg via ORAL
  Filled 2015-02-14: qty 2

## 2015-02-14 MED ORDER — SODIUM CHLORIDE 0.9 % IV BOLUS (SEPSIS): 500.0000 mL | Freq: Once | INTRAVENOUS | Status: DC

## 2015-02-14 MED ORDER — SODIUM CHLORIDE 0.9 % IV BOLUS (SEPSIS)
1000.0000 mL | INTRAVENOUS | Status: DC
  Administered 2015-02-14 (×2): 1000 mL via INTRAVENOUS

## 2015-02-14 MED ORDER — VANCOMYCIN HCL IN DEXTROSE 1-5 GM/200ML-% IV SOLN: 1000.0000 mg | Freq: Once | INTRAVENOUS | Status: DC

## 2015-02-14 MED ORDER — CEFEPIME HCL 2 G IJ SOLR
2.0000 g | Freq: Two times a day (BID) | INTRAMUSCULAR | Status: DC
  Administered 2015-02-15 – 2015-02-21 (×13): 2 g via INTRAVENOUS
  Filled 2015-02-14 (×14): qty 2

## 2015-02-14 MED ORDER — VANCOMYCIN HCL 10 G IV SOLR
1750.0000 mg | Freq: Once | INTRAVENOUS | Status: AC
  Administered 2015-02-14: 1750 mg via INTRAVENOUS
  Filled 2015-02-14: qty 1750

## 2015-02-14 MED ORDER — ONDANSETRON HCL 4 MG PO TABS: 4.0000 mg | ORAL_TABLET | Freq: Four times a day (QID) | ORAL | Status: DC | PRN

## 2015-02-14 NOTE — H&P (Signed)
[facility] History and Physical  Taylor [name] VFI:[id number] DOB: 1928/08/29 DOA: 02/14/2015  Referring physician:   PCP: [name], MD   Chief Complaint: Fall and hypotension  HPI:  82 year old male with a history of paroxysmal atrial fibrillation on xarelto, COPD, on nocturnal oxygen 2.5 L, diabetes mellitus, hypertension, recently admitted 1/6-1/8 for community-acquired pneumonia, left lower lobe, discharged home on levofloxacin for 7 days, who presents with weakness, persistent nausea, syncopal episode at home, and the patient remembers hitting the back of his head. Patient was found on the floor by his family, daughter brings him in states that patient has been weak and bed bound since his discharge. Due to nausea the patient has not been eating or drinking. No diarrhea, continues to have pleuritic chest pain when he coughs. Denies any headache, blurry vision, slurred speech. Patient has a history of chronic low back pain, followed by Dr. [name], had an MRI 2 weeks ago, was found to have lesions concerning for metastatic disease and was referred to Dr. [name] for further evaluation. He has not been seen by oncology and had an upcoming appointment next week. Patient states that he has been compliant with all his medications including HCTZ, levofloxacin. On arrival systolic blood pressure was 64 /34. Sepsis protocol was initiated. White count appears to be persistently high at 17.9. Hemoglobin has dropped from 10.4>9.2. Creatinine has increased from 0.7>2.03. Lactate 2.86.    Review of Systems: negative for the following  Constitutional: Denies fever, chills, diaphoresis, appetite change and fatigue.  HEENT: Denies photophobia, eye pain, redness, hearing loss, ear pain, congestion, sore throat, rhinorrhea, sneezing, mouth sores, trouble swallowing, neck pain, neck stiffness and tinnitus.  Respiratory: Denies SOB, DOE, cough, chest tightness, and wheezing.   Cardiovascular: Denies chest pain, palpitations and leg swelling.  Gastrointestinal: Positive for nausea, vomiting, abdominal pain, diarrhea, constipation, blood in stool and abdominal distention.  Genitourinary: Denies dysuria, urgency, frequency, hematuria, flank pain and difficulty urinating.  Musculoskeletal: Denies myalgias, back pain, joint swelling, arthralgias and gait problem.  Skin: Denies pallor, rash and wound.  Neurological: Positive for dizziness and fall and weakness, seizures, syncope,, light-headedness, numbness and headaches.  Hematological: Denies adenopathy. Easy bruising, personal or family bleeding history  Psychiatric/Behavioral: Denies suicidal ideation, mood changes, confusion, nervousness, sleep disturbance and agitation       Past Medical History  Diagnosis Date  . Hypertension   . Hyperlipidemia   . Hypothyroidism   . COPD (chronic obstructive pulmonary disease) ([place])   . Atrial fibrillation ([place])   . Glaucoma   . Anxiety   . History of bladder cancer   . Lung nodule     SMALL  . OSA (obstructive sleep apnea)   . Heart murmur   . Dysrhythmia     Hx of Atrial Flutter  . Deep vein blood clot of left lower extremity ([place])   . On home oxygen therapy     At night, during the day PRN 2.5L  . Cancer ([place])     bladder cancer, skin cancer  . Diabetes mellitus without complication (HCC)     Type 2  . Depression   . Kidney stones 2016  . Arthritis   . Osteoporosis      Past Surgical History  Procedure Laterality Date  . Cardiovascular stress test  12/25/2007    EF 64%. NO EVIDENCE OF ISCHEMIA  . Cystoscopy N/A 12/15  . Varicose vein surgery      at 82 years old first, and about  20 years ago  . Vascular surgery    . Back surgery  10-18-11    [facility]  . Tonsillectomy    . Eye surgery Bilateral     cataracts  . Multiple tooth extractions    . Total knee arthroplasty Left 10/11/2014    Procedure: TOTAL KNEE ARTHROPLASTY;  Surgeon:  Renette Butters, MD;  Location: [facility];  Service: Orthopedics;  Laterality: Left;      Social History:  reports that he quit smoking about 4 years ago. His smoking use included Cigarettes. He has a 90 pack-year smoking history. He has never used smokeless tobacco. He reports that he does not drink alcohol or use illicit drugs.    Allergies  Allergen Reactions  . Amoxicillin Palpitations  . Motrin [Ibuprofen] Palpitations  . Chantix [Varenicline Tartrate] Other (See Comments)    depression    Family History  Problem Relation Age of Onset  . Cancer - Lung Mother     36  . Heart attack Mother   . Heart Problems Father   . Diabetes Brother   . Alcoholism Brother   . Other Daughter     [place]  . Other Other     [place]        Prior to Admission medications   Medication Sig Start Date End Date Taking? Authorizing Provider  albuterol (PROVENTIL HFA;VENTOLIN HFA) 108 (90 BASE) MCG/ACT inhaler Inhale 2 puffs into the lungs every 4 (four) hours as needed for wheezing or shortness of breath.   Yes Historical Provider, MD  albuterol (PROVENTIL) (2.5 MG/3ML) 0.083% nebulizer solution Take 2.5 mg by nebulization every 6 (six) hours as needed for shortness of breath.  04/25/14  Yes Historical Provider, MD  alendronate (FOSAMAX) 70 MG tablet Take 70 mg by mouth once a week. Monday 06/08/14  Yes Historical Provider, MD  ALPRAZolam Duanne Moron) 0.25 MG tablet Take one tablet by mouth every night at bedtime as needed for anxiety 10/28/14  Yes Tiffany L Reed, DO  atorvastatin (LIPITOR) 10 MG tablet Take 10 mg by mouth daily.   Yes Historical Provider, MD  Calcium Carbonate-Vitamin D (CALCIUM + D PO) Take 2 tablets by mouth daily.   Yes Historical Provider, MD  diltiazem (CARDIZEM CD) 180 MG 24 hr capsule Take 180 mg by mouth daily.   Yes Historical Provider, MD  DULoxetine (CYMBALTA) 30 MG capsule Take 30 mg by mouth every evening.    Yes Historical Provider, MD  ferrous sulfate 325  (65 FE) MG EC tablet Take 325 mg by mouth 2 (two) times daily.   Yes Historical Provider, MD  fluticasone (FLONASE) 50 MCG/ACT nasal spray Place 2 sprays into the nose daily.     Yes Historical Provider, MD  gabapentin (NEURONTIN) 100 MG capsule Take 100 mg by mouth at bedtime. 09/06/14  Yes Historical Provider, MD  hydrochlorothiazide (MICROZIDE) 12.5 MG capsule Take 1 capsule (12.5 mg total) by mouth daily. 02/12/15  Yes Ripudeep Krystal Eaton, MD  ketoconazole (NIZORAL) 2 % cream Apply 2 application topically as needed. rash 08/28/14  Yes Historical Provider, MD  latanoprost (XALATAN) 0.005 % ophthalmic solution Place 1 drop into both eyes every evening.    Yes Historical Provider, MD  levofloxacin (LEVAQUIN) 750 MG tablet Take 1 tablet (750 mg total) by mouth daily. X 7days 02/12/15  Yes Ripudeep K Rai, MD  levothyroxine (SYNTHROID, LEVOTHROID) 100 MCG tablet Take 50-100 mcg by mouth daily before breakfast. Takes 1mg on sun and  mon Takes 136mg all other days   Yes Historical Provider, MD  metFORMIN (GLUCOPHAGE) 500 MG tablet Take 500 mg by mouth daily.    Yes Historical Provider, MD  oxyCODONE-acetaminophen (PERCOCET) 5-325 MG per tablet Take one tablet by mouth every 4 hours as needed for moderate pain. Do not exceed 4gm of Tylenol in 24 hours 10/28/14  Yes MGildardo Cranker DO  OXYGEN Inhale 2.5 L into the lungs at bedtime. Only use oxygen at night   Yes Historical Provider, MD  polyethylene glycol (MIRALAX / GLYCOLAX) packet Take 17 g by mouth daily.     Yes Historical Provider, MD  rivaroxaban (XARELTO) 10 MG TABS tablet Take 1 tablet (10 mg total) by mouth daily. 10/11/14  Yes BLovett Calender PA-C     Physical Exam: Filed Vitals:   02/14/15 1530 02/14/15 1545 02/14/15 1600 02/14/15 1615  BP: '87/41 82/39 95/42 '$ 96/46  Pulse: 70     Temp:      TempSrc:      Resp: '18 16 17 19  '$ Height:      Weight:      SpO2: 97%        Constitutional:  Pale, ill-appearing, Alert and oriented x3.  Head:  Normocephalic and atraumatic  Ear: TM normal bilaterally  Mouth: no erythema or exudates, MMM  Eyes: Small contusion mid parietal scalp without abrasion or laceration.  Eyes: Conjunctivae and EOM are normal. Pupils are equal, round, and reactive to light.  Neck: Normal range of motion and phonation normal. Neck supple.  Cardiovascular: Normal rate, regular rhythm and normal heart sounds.  Palpable right radial pulse. Capillary refill less than 3 seconds  Pulmonary/Chest: CTAB, no wheezes, rales, or rhonchi  Abdominal: Soft. Non-tender, non-distended, bowel sounds are normal, no masses, organomegaly, or guarding present.  GU: no CVA tenderness Musculoskeletal: No joint deformities, erythema, or stiffness, ROM full and no nontender Ext: no edema and no cyanosis, pulses palpable bilaterally (DP and PT)  Hematology: no cervical, inginal, or axillary adenopathy.  Neurological: A&O x3, Strenght is normal and symmetric bilaterally, cranial nerve II-XII are grossly intact, no focal motor deficit, sensory intact to light touch bilaterally.  Skin: Warm, dry and intact. No rash, cyanosis, or clubbing.  Psychiatric: Normal mood and affect. speech and behavior is normal. Judgment and thought content normal. Cognition and memory are normal.      Data Review   Micro Results Recent Results (from the past 240 hour(s))  Culture, blood (routine x 2) Call MD if unable to obtain prior to antibiotics being given     Status: None (Preliminary result)   Collection Time: 02/10/15  9:55 PM  Result Value Ref Range Status   Specimen Description BLOOD LEFT ANTECUBITAL  Final   Special Requests BOTTLES DRAWN AEROBIC AND ANAEROBIC 5CC  Final   Culture NO GROWTH 4 DAYS  Final   Report Status PENDING  Incomplete  Culture, blood (routine x 2) Call MD if unable to obtain prior to antibiotics being given     Status: None (Preliminary result)   Collection Time: 02/10/15 10:02 PM  Result Value Ref Range Status    Specimen Description BLOOD RIGHT ARM  Final   Special Requests IN PEDIATRIC BOTTLE [place] Final   Culture NO GROWTH 4 DAYS  Final   Report Status PENDING  Incomplete  MRSA PCR Screening     Status: Abnormal   Collection Time: 02/11/15  4:48 AM  Result Value Ref Range Status   MRSA by PCR POSITIVE (  A) NEGATIVE Final    Comment:        The GeneXpert MRSA Assay (FDA approved for NASAL specimens only), is one component of a comprehensive MRSA colonization surveillance program. It is not intended to diagnose MRSA infection nor to guide or monitor treatment for MRSA infections. RESULT CALLED TO, READ BACK BY AND VERIFIED WITH: R HANEY '@0649'$  02/05/15 [facility]     Radiology Reports Dg Chest 2 View  02/10/2015  CLINICAL DATA:  Left-sided chest pain in the mornings for the last few days. EXAM: CHEST  2 VIEW COMPARISON:  04/12/2011 FINDINGS: Heart size is normal. There is atherosclerosis of the aorta. The right lung is clear. There is abnormal density in the left lower lobe posteriorly most consistent with bronchopneumonia. Follow-up to clearing recommended to ensure there is not a mass lesion. No effusion. No acute bone finding. IMPRESSION: Left lower lobe infiltrate most consistent with bronchopneumonia. Follow-up to clearing to rule out underlying mass. Electronically Signed   By: Nelson Chimes M.D.   On: 02/10/2015 15:12   Dg Chest Port 1 View  02/14/2015  CLINICAL DATA:  Pneumonia. EXAM: PORTABLE CHEST 1 VIEW COMPARISON:  02/10/2015 FINDINGS: Left lower lobe infiltrate again identified. This may be slightly smaller and less dense compared to the prior chest x-ray. No edema or pleural fluid. The heart size and mediastinal contours are normal. IMPRESSION: Slightly smaller and less dense appearing left lower lobe pneumonia. Electronically Signed   By: Aletta Edouard M.D.   On: 02/14/2015 15:18     CBC  Recent Labs Lab 02/10/15 1525 02/11/15 0323 02/12/15 0700 02/14/15 1530 02/14/15 1548   WBC 17.1* 16.1* 15.7* 17.9*  --   HGB 10.8* 10.5* 10.4* 8.4* 9.2*  HCT 36.6* 35.7* 34.8* 27.9* 27.0*  PLT 265 232 229 236  --   MCV 93.8 93.5 92.8 90.9  --   MCH 27.7 27.5 27.7 27.4  --   MCHC 29.5* 29.4* 29.9* 30.1  --   RDW 14.8 14.7 14.6 15.0  --   LYMPHSABS 1.4 1.2  --  1.3  --   MONOABS 1.7* 1.6*  --  2.0*  --   EOSABS 0.2 0.2  --  0.1  --   BASOSABS 0.0 0.0  --  0.0  --     Chemistries   Recent Labs Lab 02/10/15 2328 02/11/15 0323 02/11/15 0656 02/12/15 0700 02/14/15 1530 02/14/15 1548  NA 138 140 139 136 136 134*  K 6.0* 6.3* 5.0 4.9 4.9 5.1  CL 99* 104 101 98* 99* 98*  CO2 '31 28 27 30 26  '$ --   GLUCOSE 169* 116* 132* 140* 120* 116*  BUN 21* '19 16 20 '$ 59* 66*  CREATININE 0.74 0.74 0.76 0.71 2.03* 1.90*  CALCIUM 9.0 8.9 9.2 9.0 8.0*  --   AST  --   --   --   --  21  --   ALT  --   --   --   --  21  --   ALKPHOS  --   --   --   --  70  --   BILITOT  --   --   --   --  0.3  --    ------------------------------------------------------------------------------------------------------------------ estimated creatinine clearance is 31.1 mL/min (by C-G formula based on Cr of 1.9). ------------------------------------------------------------------------------------------------------------------ No results for input(s): HGBA1C in the last 72 hours. ------------------------------------------------------------------------------------------------------------------ No results for input(s): CHOL, HDL, LDLCALC, TRIG, CHOLHDL, LDLDIRECT in the last 72 hours. ------------------------------------------------------------------------------------------------------------------ No results  for input(s): TSH, T4TOTAL, T3FREE, THYROIDAB in the last 72 hours.  Invalid input(s): FREET3 ------------------------------------------------------------------------------------------------------------------ No results for input(s): VITAMINB12, FOLATE, FERRITIN, TIBC, IRON, RETICCTPCT in the last 72  hours.  Coagulation profile No results for input(s): INR, PROTIME in the last 168 hours.  No results for input(s): DDIMER in the last 72 hours.  Cardiac Enzymes No results for input(s): CKMB, TROPONINI, MYOGLOBIN in the last 168 hours.  Invalid input(s): CK ------------------------------------------------------------------------------------------------------------------ Invalid input(s): POCBNP   CBG:  Recent Labs Lab 02/11/15 1641 02/11/15 2043 02/12/15 0719 02/12/15 1149 02/12/15 1712  GLUCAP 176* 156* 135* 127* 128*       EKG: Independently reviewed.    Assessment/Plan Principal Problem:   Sepsis ([place]) Active Problems:   Type 2 diabetes mellitus (HCC)   Hypothyroidism   Hypertension   Hyperlipidemia   History of depression   COPD with acute exacerbation (HCC)   Low back pain radiating to left leg    Sepsis with hypovolemic shock Likely in the setting of recent left lower lobe pneumonia with incomplete resolution vs UA White count still elevated, lactate 2.86, creatinine 0.71> 2.03, Pro calcitonin pending Compliant with Levaquin at home, given poor oral intake and nausea the patient appears severely dehydrated Blood culture from 1/6 remain negative during previous hospitalization Would repeat blood culture 2, broad-spectrum antibiotics patient initiated on cefepime and vancomycin Sepsis protocol initiated Discussed with Dr. [name] who recently performed MRI of the patient's lumbar spine and there was no evidence of discitis Check UA, Aggressive fluid resuscitation per sepsis protocol   Syncope likely secondary to orthostatic hypotension in the setting of pneumonia/sepsis, check troponin, 2-D echo, doubt PE   Community-acquired pneumonia with underlying history of COPD, chronic respiratory failure - Repeat Chest x-ray showed some improvement in left lower lobe pneumonia Placed on levofloxacin for 7 days, currently day #4 of treatment since  last admission - Previous Influenza panel negative, urine strep antigen negative.  -Previous Pro calcitonin less than 0.1, lactic acid 1.0. Lactate is now 2.86 - Previous Blood cultures remain negative to date. Antibiotics broadened to cefepime/vancomycin  Hyperkalemia  -Previously K+ 6.5 , now 5.1,- Placed on low-dose of hydrochlorothiazide 12.5 mg daily prior to DC on 1/8 .    Severe COPD with chronic respiratory failure - Using 2.5 Lpm supplemental O2 at night while home  - Continue duo nebs, wean O2 as tolerated  Type II DM  - Holding home metformin while inpt  - Continue sliding scale insulin, hemoglobin A1c 7.0 Anion gap 11  Fall-given that the patient is on xarelto will obtain CT head to rule out intracranial bleeding   Paroxysmal atrial fib  - Currently in sinus, Continue Xarelto  -Hold diltiazem. Patient is currently rate controlled - CHADS vasc 2, patient was not on any anticoagulation prior to his left knee total arthroplasty and was placed on xarelto for DVT prophylaxis. Patient was continued on xarelto while inpatient however, he may discuss with his primary cardiologist, Dr Acie Fredrickson, whether to stay on aspirin versus anti-coagulation Discharged home on Xarelto after last admission  Hypothyroidism Continue Synthroid     Code Status:   full Family Communication: bedside Disposition Plan: admit   Total time spent 55 minutes.Greater than 50% of this time was spent in counseling, explanation of diagnosis, planning of further management, and coordination of care  [facility] Pager [phone]  If 7PM-7AM, please contact night-coverage [email or web address] Password [facility] 02/14/2015, 5:23 PM

## 2015-02-14 NOTE — Progress Notes (Signed)
ANTIBIOTIC CONSULT NOTE - INITIAL  Pharmacy Consult for Cefepime and Vancomycin Indication: HAP  Allergies  Allergen Reactions  . Amoxicillin Palpitations  . Motrin [Ibuprofen] Palpitations  . Chantix [Varenicline Tartrate] Other (See Comments)    depression    Patient Measurements: Height: '5\' 7"'$  (170.2 cm) Weight: 200 lb (90.719 kg) IBW/kg (Calculated) : 66.1  Vital Signs: Temp: 97.8 F (36.6 C) (01/10 1442) Temp Source: Oral (01/10 1442) BP: 64/34 mmHg (01/10 1442) Pulse Rate: 76 (01/10 1442) Intake/Output from previous day:   Intake/Output from this shift:    Labs:  Recent Labs  02/12/15 0700  WBC 15.7*  HGB 10.4*  PLT 229  CREATININE 0.71   Estimated Creatinine Clearance: 73.8 mL/min (by C-G formula based on Cr of 0.71). No results for input(s): VANCOTROUGH, VANCOPEAK, VANCORANDOM, GENTTROUGH, GENTPEAK, GENTRANDOM, TOBRATROUGH, TOBRAPEAK, TOBRARND, AMIKACINPEAK, AMIKACINTROU, AMIKACIN in the last 72 hours.   Microbiology: Recent Results (from the past 720 hour(s))  Culture, blood (routine x 2) Call MD if unable to obtain prior to antibiotics being given     Status: None (Preliminary result)   Collection Time: 02/10/15  9:55 PM  Result Value Ref Range Status   Specimen Description BLOOD LEFT ANTECUBITAL  Final   Special Requests BOTTLES DRAWN AEROBIC AND ANAEROBIC 5CC  Final   Culture NO GROWTH 4 DAYS  Final   Report Status PENDING  Incomplete  Culture, blood (routine x 2) Call MD if unable to obtain prior to antibiotics being given     Status: None (Preliminary result)   Collection Time: 02/10/15 10:02 PM  Result Value Ref Range Status   Specimen Description BLOOD RIGHT ARM  Final   Special Requests IN PEDIATRIC BOTTLE [place]  Final   Culture NO GROWTH 4 DAYS  Final   Report Status PENDING  Incomplete  MRSA PCR Screening     Status: Abnormal   Collection Time: 02/11/15  4:48 AM  Result Value Ref Range Status   MRSA by PCR POSITIVE (A) NEGATIVE Final   Comment:        The GeneXpert MRSA Assay (FDA approved for NASAL specimens only), is one component of a comprehensive MRSA colonization surveillance program. It is not intended to diagnose MRSA infection nor to guide or monitor treatment for MRSA infections. RESULT CALLED TO, READ BACK BY AND VERIFIED WITH: R HANEY '@0649'$  02/05/15 [facility]     Medical History: Past Medical History  Diagnosis Date  . Hypertension   . Hyperlipidemia   . Hypothyroidism   . COPD (chronic obstructive pulmonary disease) ([place])   . Atrial fibrillation ([place])   . Glaucoma   . Anxiety   . History of bladder cancer   . Lung nodule     SMALL  . OSA (obstructive sleep apnea)   . Heart murmur   . Dysrhythmia     Hx of Atrial Flutter  . Deep vein blood clot of left lower extremity ([place])   . On home oxygen therapy     At night, during the day PRN 2.5L  . Cancer ([place])     bladder cancer, skin cancer  . Diabetes mellitus without complication (HCC)     Type 2  . Depression   . Kidney stones 2016  . Arthritis   . Osteoporosis    Assessment: 82 yo M presents on 1/10 after passing out at home. Pharmacy to dose abx for HAP. Afebrile, WBC elevated at 15.7. SCr stable, CrCl ~1m/min. Cefepime and vanc ordered in the ED. Dose of  vancomycin adjusted.  Goal of Therapy:  Vancomycin trough level 15-20 mcg/ml  Resolution of infection  Plan:  Continue cefepime 2g IV Q12 Continue vancomycin 1g IV Q12  Monitor clinical picture, renal function, VT prn F/U C&S, abx deescalation / LOT  Elenor Quinones, PharmD, BCPS Clinical Pharmacist Pager [phone] 02/14/2015 2:55 PM

## 2015-02-14 NOTE — Progress Notes (Signed)
Pharmacy Code Sepsis Protocol  Time of code sepsis page: 1502 Time of antibiotic delivery: 1511  Were antibiotics ordered at the time of the code sepsis page? No Was it required to contact the physician? '[x]'$  Physician not contacted '[]'$  Physician contacted to order antibiotics for code sepsis '[]'$  Physician contacted to recommend changing antibiotics  Pharmacy consulted for: vanc/cefepime  Anti-infectives    Start     Dose/Rate Route Frequency Ordered Stop   02/15/15 0400  vancomycin (VANCOCIN) IVPB 1000 mg/200 mL premix     1,000 mg 200 mL/hr over 60 Minutes Intravenous Every 12 hours 02/14/15 1457     02/15/15 0330  ceFEPIme (MAXIPIME) 2 g in dextrose 5 % 50 mL IVPB     2 g 100 mL/hr over 30 Minutes Intravenous Every 12 hours 02/14/15 1457     02/14/15 1530  vancomycin (VANCOCIN) 1,750 mg in sodium chloride 0.9 % 500 mL IVPB     1,750 mg 250 mL/hr over 120 Minutes Intravenous  Once 02/14/15 1449     02/14/15 1500  ceFEPIme (MAXIPIME) 2 g in dextrose 5 % 50 mL IVPB     2 g 100 mL/hr over 30 Minutes Intravenous  Once 02/14/15 1446     02/14/15 1500  vancomycin (VANCOCIN) IVPB 1000 mg/200 mL premix  Status:  Discontinued     1,000 mg 200 mL/hr over 60 Minutes Intravenous  Once 02/14/15 1446 02/14/15 1449      '@infusions'$ @   Nurse education provided: '[x]'$  Minutes left to administer antibiotics to achieve 1 hour goal '[x]'$  Correct order of antibiotic administration '[x]'$  Antibiotic Y-site compatibilities     Elicia Lamp, PharmD, BCPS Clinical Pharmacist Pager [phone] 02/14/2015 3:08 PM

## 2015-02-14 NOTE — ED Notes (Signed)
Taylor Maples, MD aware of manual blood pressure

## 2015-02-14 NOTE — ED Provider Notes (Signed)
CSN: [id number]     Arrival date & time 02/14/15  1423 History   First MD Initiated Contact with Patient 02/14/15 1425     Chief Complaint  Patient presents with  . Neurologic Problem     (Consider location/radiation/quality/duration/timing/severity/associated sxs/prior Treatment) HPI   Taylor [name] is a 82 y.o. male who presents for evaluation of weakness, syncope and left shoulder pain. He was at home today when he was in the living nd fell, striking his head on a dresser. He called out to a family member, his wife, and she came to him. The patient reports that he passed out. He states he does not have a headache at this time. Since being discharged from hospital 2 days ago. He has had decreased appetite and periods of feeling "clammy". He is taking his usual medications, and today started hydrochlorothiazide, which was prescribed to treat hyperkalemia. He states that his cough has improved. He has not taken his temperature at home. He denies chest pain, back pain, leg pain. Her right arm pain. The pain in his left shoulder is worse with movement. There are no other known modifying factors.   Past Medical History  Diagnosis Date  . Hypertension   . Hyperlipidemia   . Hypothyroidism   . COPD (chronic obstructive pulmonary disease) ([place])   . Atrial fibrillation ([place])   . Glaucoma   . Anxiety   . History of bladder cancer   . Lung nodule     SMALL  . OSA (obstructive sleep apnea)   . Heart murmur   . Dysrhythmia     Hx of Atrial Flutter  . Deep vein blood clot of left lower extremity ([place])   . On home oxygen therapy     At night, during the day PRN 2.5L  . Cancer ([place])     bladder cancer, skin cancer  . Diabetes mellitus without complication (HCC)     Type 2  . Depression   . Kidney stones 2016  . Arthritis   . Osteoporosis    Past Surgical History  Procedure Laterality Date  . Cardiovascular stress test  12/25/2007    EF 64%. NO EVIDENCE OF ISCHEMIA  . Cystoscopy N/A  12/15  . Varicose vein surgery      at 82 years old first, and about 20 years ago  . Vascular surgery    . Back surgery  10-18-11    [facility]  . Tonsillectomy    . Eye surgery Bilateral     cataracts  . Multiple tooth extractions    . Total knee arthroplasty Left 10/11/2014    Procedure: TOTAL KNEE ARTHROPLASTY;  Surgeon: Renette Butters, MD;  Location: [facility];  Service: Orthopedics;  Laterality: Left;   Family History  Problem Relation Age of [place]set  . Cancer - Lung Mother     59  . Heart attack Mother   . Heart Problems Father   . Diabetes Brother   . Alcoholism Brother   . Other Daughter     [place]  . Other Other     [place]   Social History  Substance Use Topics  . Smoking status: Former Smoker -- 1.50 packs/day for 60 years    Types: Cigarettes    Quit date: 11/27/2010  . Smokeless tobacco: Never Used  . Alcohol Use: No    Review of Systems  All other systems reviewed and are negative.     Allergies  Amoxicillin; Motrin;  and Chantix  Home Medications   Prior to Admission medications   Medication Sig Start Date End Date Taking? Authorizing Provider  albuterol (PROVENTIL HFA;VENTOLIN HFA) 108 (90 BASE) MCG/ACT inhaler Inhale 2 puffs into the lungs every 4 (four) hours as needed for wheezing or shortness of breath.    Historical Provider, MD  albuterol (PROVENTIL) (2.5 MG/3ML) 0.083% nebulizer solution Take 2.5 mg by nebulization every 6 (six) hours as needed for shortness of breath.  04/25/14   Historical Provider, MD  alendronate (FOSAMAX) 70 MG tablet Take 70 mg by mouth once a week. Monday 06/08/14   Historical Provider, MD  ALPRAZolam Duanne Moron) 0.25 MG tablet Take one tablet by mouth every night at bedtime as needed for anxiety 10/28/14   Tiffany L Reed, DO  atorvastatin (LIPITOR) 10 MG tablet Take 10 mg by mouth daily.    Historical Provider, MD  Calcium Carbonate-Vitamin D (CALCIUM + D PO) Take 2 tablets by mouth daily.     Historical Provider, MD  diltiazem (CARDIZEM CD) 180 MG 24 hr capsule Take 180 mg by mouth daily.    Historical Provider, MD  DULoxetine (CYMBALTA) 30 MG capsule Take 30 mg by mouth every evening.     Historical Provider, MD  ferrous sulfate 325 (65 FE) MG EC tablet Take 325 mg by mouth 2 (two) times daily.    Historical Provider, MD  fluticasone (FLONASE) 50 MCG/ACT nasal spray Place 2 sprays into the nose daily.      Historical Provider, MD  gabapentin (NEURONTIN) 100 MG capsule Take 100 mg by mouth at bedtime. 09/06/14   Historical Provider, MD  hydrochlorothiazide (MICROZIDE) 12.5 MG capsule Take 1 capsule (12.5 mg total) by mouth daily. 02/12/15   Ripudeep Krystal Eaton, MD  ketoconazole (NIZORAL) 2 % cream Apply 2 application topically as needed. rash 08/28/14   Historical Provider, MD  latanoprost (XALATAN) 0.005 % ophthalmic solution Place 1 drop into both eyes every evening.     Historical Provider, MD  levofloxacin (LEVAQUIN) 750 MG tablet Take 1 tablet (750 mg total) by mouth daily. X 7days 02/12/15   Ripudeep Krystal Eaton, MD  levothyroxine (SYNTHROID, LEVOTHROID) 100 MCG tablet Take 50-100 mcg by mouth daily before breakfast. Takes 84mg on sun and mon Takes 1064m all other days    Historical Provider, MD  metFORMIN (GLUCOPHAGE) 500 MG tablet Take 500 mg by mouth daily.     Historical Provider, MD  oxyCODONE-acetaminophen (PERCOCET) 5-325 MG per tablet Take one tablet by mouth every 4 hours as needed for moderate pain. Do not exceed 4gm of Tylenol in 24 hours 10/28/14   MoGildardo CrankerDO  OXYGEN Inhale 2.5 L into the lungs at bedtime. Only use oxygen at night    Historical Provider, MD  polyethylene glycol (MIRALAX / GLYCOLAX) packet Take 17 g by mouth daily.      Historical Provider, MD  rivaroxaban (XARELTO) 10 MG TABS tablet Take 1 tablet (10 mg total) by mouth daily. 10/11/14   Brittney Kelly, PA-C   BP 80/40 mmHg  Pulse 76  Temp(Src) 97.8 F (36.6 C) (Oral)  Resp 16  Ht '5\' 7"'$  (1.702 m)  Wt 200 lb  (90.719 kg)  BMI 31.32 kg/m2  SpO2 88% Physical Exam  Constitutional: He is oriented to person, place, and time. He appears well-developed and well-nourished.  HENT:  Head: Normocephalic.  Right Ear: External ear normal.  Left Ear: External ear normal.  Small contusion mid parietal scalp without abrasion or laceration.  Eyes: Conjunctivae  and EOM are normal. Pupils are equal, round, and reactive to light.  Neck: Normal range of motion and phonation normal. Neck supple.  Cardiovascular: Normal rate, regular rhythm and normal heart sounds.   Palpable right radial pulse. Capillary refill less than 3 seconds  Pulmonary/Chest: Effort normal and breath sounds normal. He exhibits no bony tenderness.  Abdominal: Soft. There is no tenderness.  Musculoskeletal: Normal range of motion.  Neurological: He is alert and oriented to person, place, and time. No cranial nerve deficit or sensory deficit. He exhibits normal muscle tone. Coordination normal.  Skin: Skin is warm, dry and intact.  Psychiatric: He has a normal mood and affect. His behavior is normal. Judgment and thought content normal.  Nursing note and vitals reviewed.   ED Course  Procedures (including critical care time)  Medications  sodium chloride 0.9 % bolus 1,000 mL (1,000 mLs Intravenous New Bag/Given 02/14/15 1524)  vancomycin (VANCOCIN) 1,750 mg in sodium chloride 0.9 % 500 mL IVPB (not administered)  ceFEPIme (MAXIPIME) 2 g in dextrose 5 % 50 mL IVPB (not administered)  vancomycin (VANCOCIN) IVPB 1000 mg/200 mL premix (not administered)  ceFEPIme (MAXIPIME) 2 g in dextrose 5 % 50 mL IVPB (2 g Intravenous New Bag/Given 02/14/15 1528)    Patient Vitals for the past 24 hrs:  BP Temp Temp src Pulse Resp SpO2 Height Weight  02/14/15 1503 (!) 80/40 mmHg - - - - - - -  02/14/15 1442 (!) 64/34 mmHg 97.8 F (36.6 C) Oral 76 16 (!) 88 % '5\' 7"'$  (1.702 m) 200 lb (90.719 kg)  02/14/15 1439 - - - - - 92 % - -       4:20 PM  Reevaluation with update and discussion. After initial assessment and treatment, an updated evaluation reveals patient is alert, states he feels better. Blood pressure 96 systolic, at this time Patient's daughter is here now. She is updated on findings. She indicates that she heard him fall, and was at his side within 10 seconds, and he was alert. She states that since arriving home, he has not eaten much. He also slept 17 hours, the first day he was home. He is unable to walk around his house, which is unusual for him. She feels that he is not sufficiently recovered to be treated at home.    4:24 PM-Consult complete with Dr. Allyson Sabal. Patient case explained and discussed. She agrees to admit patient for further evaluation and treatment. Call ended at [place] Performed by: Richarda Blade Total critical care time: 35 minutes Critical care time was exclusive of separately billable procedures and treating other patients. Critical care was necessary to treat or prevent imminent or life-threatening deterioration. Critical care was time spent personally by me on the following activities: development of treatment plan with patient and/or surrogate as well as nursing, discussions with consultants, evaluation of patient's response to treatment, examination of patient, obtaining history from patient or surrogate, ordering and performing treatments and interventions, ordering and review of laboratory studies, ordering and review of radiographic studies, pulse oximetry and re-evaluation of patient's condition.  Labs Review Labs Reviewed  CULTURE, BLOOD (ROUTINE X 2)  CULTURE, BLOOD (ROUTINE X 2)  URINE CULTURE  COMPREHENSIVE METABOLIC PANEL  CBC WITH DIFFERENTIAL/PLATELET  URINALYSIS, ROUTINE W REFLEX MICROSCOPIC (NOT AT [facility])  I-STAT CG4 LACTIC ACID, ED  I-STAT CHEM 8, ED   BUN  Date Value Ref Range Status  02/14/2015 66* 6 - 20 mg/dL Final  02/12/2015 20 6 -  20 mg/dL Final  02/11/2015 16 6  - 20 mg/dL Final  02/11/2015 19 6 - 20 mg/dL Final  10/18/2014 21 4 - 21 mg/dL Final  05/24/2014 17.6 7.0 - 26.0 mg/dL Final  03/09/2014 24* 4 - 21 mg/dL Final  04/12/2011 16 7-18 mg/dL Final   CREATININE  Date Value Ref Range Status  10/18/2014 0.9 0.6 - 1.3 mg/dL Final  05/24/2014 0.9 0.7 - 1.3 mg/dL Final  03/09/2014 0.8 0.6 - 1.3 mg/dL Final  04/12/2011 0.97 0.60-1.30 mg/dL Final   CREATININE, SER  Date Value Ref Range Status  02/14/2015 1.90* 0.61 - 1.24 mg/dL Final  02/12/2015 0.71 0.61 - 1.24 mg/dL Final  02/11/2015 0.76 0.61 - 1.24 mg/dL Final  02/11/2015 0.74 0.61 - 1.24 mg/dL Final      Imaging Review Dg Chest Port 1 View  02/14/2015  CLINICAL DATA:  Pneumonia. EXAM: PORTABLE CHEST 1 VIEW COMPARISON:  02/10/2015 FINDINGS: Left lower lobe infiltrate again identified. This may be slightly smaller and less dense compared to the prior chest x-ray. No edema or pleural fluid. The heart size and mediastinal contours are normal. IMPRESSION: Slightly smaller and less dense appearing left lower lobe pneumonia. Electronically Signed   By: Aletta Edouard M.D.   On: 02/14/2015 15:18   I have personally reviewed and evaluated these images and lab results as part of my medical decision-making.   EKG Interpretation None      MDM   Final diagnoses:  Fall  Adrenal mass ([place])  Fall, subsequent encounter   Syncope, with hypotension, and significantly elevated BUN and creatinine from baseline. This likely represents volume to completion, related to decreased oral intake. Doubt severe sepsis, metabolic instability or impending vascular collapse. He will required admission for treatment, and close observation.  Nursing Notes Reviewed/ Care Coordinated, and agree without changes. Applicable Imaging Reviewed.  Interpretation of Laboratory Data incorporated into ED treatment  Plan: Admit      Daleen Bo, MD 02/16/15 0041

## 2015-02-14 NOTE — ED Notes (Signed)
Patient undressed, in gown, on monitor, continuous pulse oximetry, blood pressure cuff and oxygen [place] (2L)

## 2015-02-14 NOTE — ED Notes (Signed)
Pt arrives EMS with c/o stood up stood up and passed out at home. Took first dose of HCTZ. Pt d/c from hospital 2 days ago from [facility].

## 2015-02-14 NOTE — ED Provider Notes (Signed)
ECG interpretation   Date: 02/14/2015  Rate: 68  Rhythm: normal sinus rhythm  QRS Axis: normal  Intervals: normal  ST/T Wave abnormalities: normal  Conduction Disutrbances: none  Narrative Interpretation:   Old EKG Reviewed: No significant changes noted     Jola Schmidt, MD 02/14/15 1627

## 2015-02-14 NOTE — ED Notes (Signed)
Blood cultures drawn prior to starting abx

## 2015-02-14 NOTE — ED Provider Notes (Signed)
MSE was initiated and I personally evaluated the patient and placed orders (if any) at  2:46 PM on February 14, 2015.  The patient appears stable so that the remainder of the MSE may be completed by another provider.  Taylor [name] is a 82 y.o. male multiple medical problems, just was discharged from the hospital with diagnosis of CAP 2 days ago. Patient states he was not feeling much better. He was brought in by EMS her he had a syncopal episode at home. Patient states he remembers getting up to go do something, and that is the last thing he remembers. Patient did have a syncopal episode and was found on the floor by family. Patient states he is generally not feeling well, denies any particular complaints. He states he is coughing and has pain in his chest when coughing.  I was asked to come in and see patient for blood pressure of 64/34. Patient is also hypoxic, oxygen saturation is in the mid 80s on room air. On exam, patient alert and oriented, no acute distress. Ill-appearing. Patient with bilateral basilar Rales on exam. Regular heart rate and rhythm. No abdominal tenderness. No peripheral edema.   Patient is hypotensive, hypoxic, syncopal episode at home. Suspect sepsis as the cause. His blood pressure here right now is 64/34. He received 500 mL by EMS. Will start sepsis protocol including fluid resuscitation, antibiotics, labs and cultures.    Jeannett Senior, PA-C 02/14/15 [place], MD 02/15/15 [phone]

## 2015-02-14 NOTE — ED Notes (Signed)
Pt returns from ct . Speaks easily. Family at bedside

## 2015-02-15 ENCOUNTER — Encounter (HOSPITAL_COMMUNITY): Admission: RE | Admit: 2015-02-15 | Payer: Medicare Other | Source: Ambulatory Visit

## 2015-02-15 ENCOUNTER — Encounter (HOSPITAL_COMMUNITY): Payer: Self-pay | Admitting: General Practice

## 2015-02-15 ENCOUNTER — Inpatient Hospital Stay (HOSPITAL_COMMUNITY): Payer: Medicare Other

## 2015-02-15 ENCOUNTER — Encounter (HOSPITAL_COMMUNITY): Payer: Medicare Other

## 2015-02-15 ENCOUNTER — Other Ambulatory Visit: Payer: Medicare Other

## 2015-02-15 DIAGNOSIS — E278 Other specified disorders of adrenal gland: Secondary | ICD-10-CM | POA: Insufficient documentation

## 2015-02-15 DIAGNOSIS — R079 Chest pain, unspecified: Secondary | ICD-10-CM

## 2015-02-15 DIAGNOSIS — E279 Disorder of adrenal gland, unspecified: Secondary | ICD-10-CM

## 2015-02-15 LAB — CBC
HEMATOCRIT: 25.3 % — AB (ref 39.0–52.0)
Hemoglobin: 7.6 g/dL — ABNORMAL LOW (ref 13.0–17.0)
MCH: 27.2 pg (ref 26.0–34.0)
MCHC: 30 g/dL (ref 30.0–36.0)
MCV: 90.7 fL (ref 78.0–100.0)
PLATELETS: 210 10*3/uL (ref 150–400)
RBC: 2.79 MIL/uL — ABNORMAL LOW (ref 4.22–5.81)
RDW: 15.1 % (ref 11.5–15.5)
WBC: 16 10*3/uL — AB (ref 4.0–10.5)

## 2015-02-15 LAB — URINE CULTURE: Culture: 3000

## 2015-02-15 LAB — COMPREHENSIVE METABOLIC PANEL
ALK PHOS: 65 U/L (ref 38–126)
ALT: 17 U/L (ref 17–63)
AST: 17 U/L (ref 15–41)
Albumin: 1.8 g/dL — ABNORMAL LOW (ref 3.5–5.0)
Anion gap: 9 (ref 5–15)
BILIRUBIN TOTAL: 0.5 mg/dL (ref 0.3–1.2)
BUN: 34 mg/dL — AB (ref 6–20)
CALCIUM: 7.4 mg/dL — AB (ref 8.9–10.3)
CO2: 22 mmol/L (ref 22–32)
Chloride: 107 mmol/L (ref 101–111)
Creatinine, Ser: 0.92 mg/dL (ref 0.61–1.24)
GFR calc Af Amer: >60 mL/min (ref >60)
Glucose, Bld: 159 mg/dL — ABNORMAL HIGH (ref 65–99)
POTASSIUM: 4.3 mmol/L (ref 3.5–5.1)
Sodium: 138 mmol/L (ref 135–145)
TOTAL PROTEIN: 5 g/dL — AB (ref 6.5–8.1)

## 2015-02-15 LAB — CBG MONITORING, ED
GLUCOSE-CAPILLARY: 107 mg/dL — AB (ref 65–99)
GLUCOSE-CAPILLARY: 94 mg/dL (ref 65–99)
Glucose-Capillary: 110 mg/dL — ABNORMAL HIGH (ref 65–99)

## 2015-02-15 LAB — CULTURE, BLOOD (ROUTINE X 2)
Culture: NO GROWTH
Culture: NO GROWTH

## 2015-02-15 LAB — GLUCOSE, CAPILLARY
GLUCOSE-CAPILLARY: 160 mg/dL — AB (ref 65–99)
Glucose-Capillary: 189 mg/dL — ABNORMAL HIGH (ref 65–99)

## 2015-02-15 LAB — TROPONIN I: Troponin I: <0.03 ng/mL (ref <0.031)

## 2015-02-15 LAB — OCCULT BLOOD X 1 CARD TO LAB, STOOL: Fecal Occult Bld: POSITIVE — AB

## 2015-02-15 LAB — TSH: TSH: 1.034 u[IU]/mL (ref 0.350–4.500)

## 2015-02-15 MED ORDER — DULOXETINE HCL 30 MG PO CPEP
30.0000 mg | ORAL_CAPSULE | Freq: Every evening | ORAL | Status: DC
  Administered 2015-02-15 – 2015-02-22 (×8): 30 mg via ORAL
  Filled 2015-02-15 (×9): qty 1

## 2015-02-15 MED ORDER — GABAPENTIN 100 MG PO CAPS
100.0000 mg | ORAL_CAPSULE | Freq: Every day | ORAL | Status: DC
  Administered 2015-02-15 – 2015-02-23 (×9): 100 mg via ORAL
  Filled 2015-02-15 (×9): qty 1

## 2015-02-15 MED ORDER — POLYETHYLENE GLYCOL 3350 17 G PO PACK
17.0000 g | PACK | Freq: Every day | ORAL | Status: DC
  Administered 2015-02-15 – 2015-02-24 (×6): 17 g via ORAL
  Filled 2015-02-15 (×9): qty 1

## 2015-02-15 MED ORDER — ALBUTEROL SULFATE (2.5 MG/3ML) 0.083% IN NEBU: 2.5000 mg | INHALATION_SOLUTION | Freq: Four times a day (QID) | RESPIRATORY_TRACT | Status: DC | PRN

## 2015-02-15 MED ORDER — DILTIAZEM HCL 25 MG/5ML IV SOLN
10.0000 mg | Freq: Once | INTRAVENOUS | Status: DC
  Filled 2015-02-15: qty 5

## 2015-02-15 MED ORDER — ATORVASTATIN CALCIUM 10 MG PO TABS
10.0000 mg | ORAL_TABLET | Freq: Every day | ORAL | Status: DC
  Administered 2015-02-15 – 2015-02-24 (×10): 10 mg via ORAL
  Filled 2015-02-15 (×10): qty 1

## 2015-02-15 MED ORDER — FERROUS SULFATE 325 (65 FE) MG PO TABS
325.0000 mg | ORAL_TABLET | Freq: Two times a day (BID) | ORAL | Status: DC
  Administered 2015-02-15 – 2015-02-24 (×18): 325 mg via ORAL
  Filled 2015-02-15 (×22): qty 1

## 2015-02-15 MED ORDER — RIVAROXABAN 20 MG PO TABS
20.0000 mg | ORAL_TABLET | Freq: Every day | ORAL | Status: DC
  Administered 2015-02-15: 20 mg via ORAL
  Filled 2015-02-15 (×2): qty 1

## 2015-02-15 MED ORDER — LEVOTHYROXINE SODIUM 50 MCG PO TABS
50.0000 ug | ORAL_TABLET | Freq: Every day | ORAL | Status: DC
Start: 2015-02-15 — End: 2015-02-24
  Administered 2015-02-15 – 2015-02-24 (×10): 50 ug via ORAL
  Filled 2015-02-15 (×12): qty 1

## 2015-02-15 MED ORDER — OXYCODONE-ACETAMINOPHEN 5-325 MG PO TABS
1.0000 | ORAL_TABLET | Freq: Three times a day (TID) | ORAL | Status: DC | PRN
  Administered 2015-02-15 – 2015-02-23 (×9): 1 via ORAL
  Filled 2015-02-15 (×9): qty 1

## 2015-02-15 MED ORDER — DILTIAZEM HCL 30 MG PO TABS
30.0000 mg | ORAL_TABLET | Freq: Four times a day (QID) | ORAL | Status: DC
  Administered 2015-02-15 – 2015-02-22 (×25): 30 mg via ORAL
  Filled 2015-02-15 (×25): qty 1

## 2015-02-15 MED ORDER — FLUTICASONE PROPIONATE 50 MCG/ACT NA SUSP
2.0000 | Freq: Every day | NASAL | Status: DC
  Administered 2015-02-15 – 2015-02-24 (×10): 2 via NASAL
  Filled 2015-02-15 (×2): qty 16

## 2015-02-15 MED ORDER — RIVAROXABAN 10 MG PO TABS: 10.0000 mg | ORAL_TABLET | Freq: Every day | ORAL | Status: DC

## 2015-02-15 MED ORDER — ALPRAZOLAM 0.25 MG PO TABS
0.2500 mg | ORAL_TABLET | Freq: Every evening | ORAL | Status: DC | PRN
  Administered 2015-02-15 – 2015-02-23 (×9): 0.25 mg via ORAL
  Filled 2015-02-15 (×9): qty 1

## 2015-02-15 MED ORDER — ENSURE ENLIVE PO LIQD
237.0000 mL | Freq: Two times a day (BID) | ORAL | Status: DC
  Administered 2015-02-16 – 2015-02-19 (×5): 237 mL via ORAL

## 2015-02-15 NOTE — ED Notes (Signed)
Sandwich bag and drink provided

## 2015-02-15 NOTE — ED Notes (Signed)
CBG 107 

## 2015-02-15 NOTE — Progress Notes (Signed)
  Echocardiogram 2D Echocardiogram has been performed.  Bobbye Charleston 02/15/2015, 4:09 PM

## 2015-02-15 NOTE — ED Notes (Signed)
Breakfast ordered 

## 2015-02-15 NOTE — Progress Notes (Signed)
  ANTICOAGULATION CONSULT NOTE - Initial Consult  Pharmacy Consult for Xarelto Indication: atrial fibrillation  Allergies  Allergen Reactions  . Amoxicillin Palpitations  . Motrin [Ibuprofen] Palpitations  . Chantix [Varenicline Tartrate] Other (See Comments)    depression    Patient Measurements: Height: '5\' 7"'$  (170.2 cm) Weight: 200 lb (90.719 kg) IBW/kg (Calculated) : 66.1  Vital Signs: BP: 137/58 mmHg (01/11 1215) Pulse Rate: 70 (01/11 1215)  Labs:  Recent Labs  02/14/15 1530 02/14/15 1548 02/14/15 2213 02/15/15 0645  HGB 8.4* 9.2* 8.6* 7.6*  HCT 27.9* 27.0* 28.6* 25.3*  PLT 236  --  204 210  APTT  --   --  23*  --   LABPROT  --   --  17.4*  --   INR  --   --  1.42  --   CREATININE 2.03* 1.90* 1.31* 0.92  TROPONINI  --   --  <0.03  <0.03 <0.03    Estimated Creatinine Clearance: 64.2 mL/min (by C-G formula based on Cr of 0.92).   Medical History: Past Medical History  Diagnosis Date  . Hypertension   . Hyperlipidemia   . Hypothyroidism   . COPD (chronic obstructive pulmonary disease) ([place])   . Atrial fibrillation ([place])   . Glaucoma   . Anxiety   . History of bladder cancer   . Lung nodule     SMALL  . OSA (obstructive sleep apnea)   . Heart murmur   . Dysrhythmia     Hx of Atrial Flutter  . Deep vein blood clot of left lower extremity ([place])   . On home oxygen therapy     At night, during the day PRN 2.5L  . Cancer ([place])     bladder cancer, skin cancer  . Diabetes mellitus without complication (HCC)     Type 2  . Depression   . Kidney stones 2016  . Arthritis   . Osteoporosis     Assessment: 82 yo M presents on 1/10 after fall and hypotension. On Xarelto '10mg'$  PTA. Looks like he had a knee replacement in September and hasnt been stopped, but no record of him ever picking up prescription. Received 2 doses on 1/7 and 1/8. Pharmacy consulted to dose Xarelto for Afib now. Hgb low at 7.6, plts wnl.  Goal of Therapy:  Monitor platelets by  anticoagulation protocol: Yes   Plan:  Start Xarelto '20mg'$  PO daily Monitor daily CBC, s/s of bleed  Elenor Quinones, PharmD, BCPS Clinical Pharmacist Pager [phone] 02/15/2015 2:09 PM

## 2015-02-15 NOTE — ED Notes (Signed)
Placed patient on bedpan per patient request.

## 2015-02-15 NOTE — Progress Notes (Addendum)
[name] PROGRESS NOTE  Taylor [name] FMB:[id number] DOB: [birth year] DOA: 02/14/2015 PCP: [name], MD  Length of stay: 1   Assessment/Plan: Principal Problem:   Sepsis ([place]) Active Problems:   Type 2 diabetes mellitus ([place])   Hypothyroidism   Hypertension   Hyperlipidemia   History of depression   COPD with acute exacerbation ([place])   Low back pain radiating to left leg    HPI:  82 year old male with a history of paroxysmal atrial fibrillation on xarelto, COPD, on nocturnal oxygen 2.5 L, diabetes mellitus, hypertension, recently admitted 1/6-1/8 for community-acquired pneumonia, left lower lobe, discharged home on levofloxacin for 7 days, who presents with weakness, persistent nausea, syncopal episode at home, and the patient remembers hitting the back of his head. Patient was found on the floor by his family, daughter brings him in states that patient has been weak and bed bound since his discharge. Due to nausea the patient has not been eating or drinking. No diarrhea, continues to have pleuritic chest pain when he coughs. Denies any headache, blurry vision, slurred speech. Patient has a history of chronic low back pain, followed by Dr. [name], had an MRI 2 weeks ago, was found to have lesions concerning for metastatic disease and was referred to Dr. [name] for further evaluation. He has not been seen by oncology and had an upcoming appointment next week. Patient states that he has been compliant with all his medications including HCTZ, levofloxacin. On arrival systolic blood pressure was 64 /34. Sepsis protocol was initiated. White count appears to be persistently high at 17.9. Hemoglobin has dropped from 10.4>9.2. Creatinine has increased from 0.7>2.03. Lactate 2.86.   Assessment and plan Sepsis with hypovolemic shock, resolved  Likely in the setting of recent left lower lobe pneumonia with incomplete resolution  , UA negative  White count still  elevated, lactate on admit 2.86 >1.6, creatinine 0.71> on admit 2.03>0.92  , Pro calcitonin <0.10 Compliant with Levaquin at home, given poor oral intake and nausea the patient appears severely dehydrated Blood culture from 1/6 remain negative during previous hospitalization Follow  blood culture 2, broad-spectrum antibiotics patient initiated on cefepime and vancomycin, day 2  Sepsis protocol initiated Discussed with Dr. [name] who recently performed MRI of the patient's lumbar spine and there was no evidence of discitis Cont Aggressive fluid resuscitation per sepsis protocol   Syncope likely secondary to orthostatic hypotension in the setting of pneumonia/sepsis, negative  troponin, 2-D echo, doubt PE on xarelto ,CT head negative    Community-acquired pneumonia with underlying history of COPD, chronic respiratory failure - Repeat Chest x-ray showed some improvement in left lower lobe pneumonia Placed on levofloxacin for 7 days, currently day #5 of treatment since last admission 1/6  - Previous Influenza panel negative, urine strep antigen negative.  -Previous Pro calcitonin less than 0.1, lactic acid 1.0. Lactate is now 2.86 - Previous Blood cultures remain negative to date. Antibiotics broadened to cefepime/vancomycin   Hyperkalemia  -Previously K+ 6.5 , now 4.3  Placed on low-dose of hydrochlorothiazide 12.5 mg daily prior to DC on 1/8 .   Severe COPD with chronic respiratory failure - Using 2.5 Lpm supplemental O2 at night while home  - Continue duo nebs, wean O2 as tolerated  Type II DM  - Holding home metformin while inpt  - Continue sliding scale insulin, hemoglobin A1c 7.0 Anion gap 11  Fall-given that the patient is on xarelto  CT head done to  rule out intracranial bleeding   Normocytic Anemia  Hg slowly trending down since last year On xarelto, no active bleeding With lytic lesions in lumbar spine and adrenal mass seen on recent CT concern for  underlying malignancy Check SPEP /UPEP, Consider bone survery Consult oncology in am   Paroxysmal atrial fib  - Currently in sinus,holding diltiazem,  Continue Xarelto  -Hold diltiazem. Patient is currently rate controlled - CHADS vasc 2, patient was not on any anticoagulation prior to his left knee total arthroplasty and was placed on xarelto for DVT prophylaxis. Patient was continued on xarelto while inpatient however, he may discuss with his primary cardiologist, Dr Acie Fredrickson, whether to stay on aspirin versus anti-coagulation Discharged home on Xarelto after last admission  Hypothyroidism Continue Synthroid  Adrenal mass, on MRI of the lumbar spine as per Dr. [name] Will obtain MRI of the abdomen to rule out renal mass, patient has history of bladder cancer Followed by Dr. Larinda Buttery  Urology and Dr [name]    DVT prophylaxsis Xarelto  Code Status:      Code Status Orders        Start     Ordered   02/14/15 1720  Full code   Continuous     02/14/15 1721    Code Status History    Date Active Date Inactive Code Status Order ID Comments User Context   02/10/2015  7:35 PM 02/12/2015  9:27 PM Full Code [id number]  Vianne Bulls, MD ED   10/11/2014  2:58 PM 10/13/2014  6:39 PM Full Code [id number]  Lovett Calender, PA-C Inpatient   03/02/2014 11:23 PM 03/07/2014  7:29 PM Full Code [id number]  Leanna Battles, MD ED      Family Communication: Discussed in detail with the patient, all imaging results, lab results explained to the patient   Disposition Plan:  As above       Consultants:  None  Procedures:  None  Antibiotics: Anti-infectives    Start     Dose/Rate Route Frequency Ordered Stop   02/15/15 0400  vancomycin (VANCOCIN) IVPB 1000 mg/200 mL premix     1,000 mg 200 mL/hr over 60 Minutes Intravenous Every 12 hours 02/14/15 1457     02/15/15 0330  ceFEPIme (MAXIPIME) 2 g in dextrose 5 % 50 mL IVPB     2 g 100 mL/hr over 30 Minutes Intravenous Every 12 hours  02/14/15 1457     02/14/15 1530  vancomycin (VANCOCIN) 1,750 mg in sodium chloride 0.9 % 500 mL IVPB     1,750 mg 250 mL/hr over 120 Minutes Intravenous  Once 02/14/15 1449 02/14/15 1917   02/14/15 1500  ceFEPIme (MAXIPIME) 2 g in dextrose 5 % 50 mL IVPB     2 g 100 mL/hr over 30 Minutes Intravenous  Once 02/14/15 1446 02/14/15 1613   02/14/15 1500  vancomycin (VANCOCIN) IVPB 1000 mg/200 mL premix  Status:  Discontinued     1,000 mg 200 mL/hr over 60 Minutes Intravenous  Once 02/14/15 1446 02/14/15 1449         HPI/Subjective: Feels a lot better this morning, minimal back pain, no chest pain or shortness of breath  Objective: Filed Vitals:   02/15/15 1130 02/15/15 1145 02/15/15 1200 02/15/15 1215  BP: 137/65 132/61 121/70 137/58  Pulse: 71 69 65 70  Temp:      TempSrc:      Resp: '18 18 19 20  '$ Height:      Weight:  SpO2: 98% 97% 96% 98%    Intake/Output Summary (Last 24 hours) at 02/15/15 1344 Last data filed at 02/15/15 0743  Gross per 24 hour  Intake   1000 ml  Output   1025 ml  Net    -25 ml    Exam:  General: No acute respiratory distress Lungs: Clear to auscultation bilaterally without wheezes or crackles Cardiovascular: Regular rate and rhythm without murmur gallop or rub normal S1 and S2 Abdomen: Nontender, nondistended, soft, bowel sounds positive, no rebound, no ascites, no appreciable mass Extremities: No significant cyanosis, clubbing, or edema bilateral lower extremities     Data Review   Micro Results Recent Results (from the past 240 hour(s))  Culture, blood (routine x 2) Call MD if unable to obtain prior to antibiotics being given     Status: None   Collection Time: 02/10/15  9:55 PM  Result Value Ref Range Status   Specimen Description BLOOD LEFT ANTECUBITAL  Final   Special Requests BOTTLES DRAWN AEROBIC AND ANAEROBIC 5CC  Final   Culture NO GROWTH 5 DAYS  Final   Report Status 02/15/2015 FINAL  Final  Culture, blood (routine x 2) Call  MD if unable to obtain prior to antibiotics being given     Status: None   Collection Time: 02/10/15 10:02 PM  Result Value Ref Range Status   Specimen Description BLOOD RIGHT ARM  Final   Special Requests IN PEDIATRIC BOTTLE [place]  Final   Culture NO GROWTH 5 DAYS  Final   Report Status 02/15/2015 FINAL  Final  MRSA PCR Screening     Status: Abnormal   Collection Time: 02/11/15  4:48 AM  Result Value Ref Range Status   MRSA by PCR POSITIVE (A) NEGATIVE Final    Comment:        The GeneXpert MRSA Assay (FDA approved for NASAL specimens only), is one component of a comprehensive MRSA colonization surveillance program. It is not intended to diagnose MRSA infection nor to guide or monitor treatment for MRSA infections. RESULT CALLED TO, READ BACK BY AND VERIFIED WITH: R HANEY '@0649'$  02/05/15 MKELLY   Blood Culture (routine x 2)     Status: None (Preliminary result)   Collection Time: 02/14/15  3:05 PM  Result Value Ref Range Status   Specimen Description BLOOD LEFT HAND  Final   Special Requests BOTTLES DRAWN AEROBIC AND ANAEROBIC 4CC  Final   Culture NO GROWTH < 24 HOURS  Final   Report Status PENDING  Incomplete  Blood Culture (routine x 2)     Status: None (Preliminary result)   Collection Time: 02/14/15  3:30 PM  Result Value Ref Range Status   Specimen Description BLOOD LEFT WRIST  Final   Special Requests BOTTLES DRAWN AEROBIC AND ANAEROBIC 5CC  Final   Culture NO GROWTH < 24 HOURS  Final   Report Status PENDING  Incomplete  Urine culture     Status: None (Preliminary result)   Collection Time: 02/14/15  6:41 PM  Result Value Ref Range Status   Specimen Description URINE, RANDOM  Final   Special Requests NONE  Final   Culture TOO YOUNG TO READ  Final   Report Status PENDING  Incomplete    Radiology Reports Dg Chest 2 View  02/10/2015  CLINICAL DATA:  Left-sided chest pain in the mornings for the last few days. EXAM: CHEST  2 VIEW COMPARISON:  04/12/2011 FINDINGS: Heart  size is normal. There is atherosclerosis of the aorta. The right  lung is clear. There is abnormal density in the left lower lobe posteriorly most consistent with bronchopneumonia. Follow-up to clearing recommended to ensure there is not a mass lesion. No effusion. No acute bone finding. IMPRESSION: Left lower lobe infiltrate most consistent with bronchopneumonia. Follow-up to clearing to rule out underlying mass. Electronically Signed   By: Nelson Chimes M.D.   On: 02/10/2015 15:12   Ct Head Wo Contrast  02/14/2015  CLINICAL DATA:  Syncope and headache. EXAM: CT HEAD WITHOUT CONTRAST TECHNIQUE: Contiguous axial images were obtained from the base of the skull through the vertex without intravenous contrast. 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 Stable cortical atrophy. The brain demonstrates no evidence of hemorrhage, acute infarction, edema, mass effect, extra-axial fluid collection, hydrocephalus or mass lesion. The skull is unremarkable. IMPRESSION: No acute findings. 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 Electronically Signed   By: Aletta Edouard M.D.   On: 02/14/2015 17:51   Dg Chest Port 1 View  02/14/2015  CLINICAL DATA:  Pneumonia. EXAM: PORTABLE CHEST 1 VIEW COMPARISON:  02/10/2015 FINDINGS: Left lower lobe infiltrate again identified. This may be slightly smaller and less dense compared to the prior chest x-ray. No edema or pleural fluid. The heart size and mediastinal contours are normal. IMPRESSION: Slightly smaller and less dense appearing left lower lobe pneumonia. Electronically Signed   By: Aletta Edouard M.D.   On: 02/14/2015 15:18     CBC  Recent Labs Lab 02/10/15 1525 02/11/15 0323 02/12/15 0700 02/14/15 1530 02/14/15 1548 02/14/15 2213 02/15/15 0645  WBC 17.1* 16.1* 15.7* 17.9*  --  17.5* 16.0*  HGB 10.8* 10.5* 10.4* 8.4* 9.2* 8.6* 7.6*  HCT 36.6* 35.7*  34.8* 27.9* 27.0* 28.6* 25.3*  PLT 265 232 229 236  --  204 210  MCV 93.8 93.5 92.8 90.9  --  90.8 90.7  MCH 27.7 27.5 27.7 27.4  --  27.3 27.2  MCHC 29.5* 29.4* 29.9* 30.1  --  30.1 30.0  RDW 14.8 14.7 14.6 15.0  --  15.0 15.1  LYMPHSABS 1.4 1.2  --  1.3  --  1.1  --   MONOABS 1.7* 1.6*  --  2.0*  --  1.7*  --   EOSABS 0.2 0.2  --  0.1  --  0.0  --   BASOSABS 0.0 0.0  --  0.0  --  0.0  --     Chemistries   Recent Labs Lab 02/11/15 0656 02/12/15 0700 02/14/15 1530 02/14/15 1548 02/14/15 2213 02/15/15 0645  NA 139 136 136 134* 140 138  K 5.0 4.9 4.9 5.1 4.2 4.3  CL 101 98* 99* 98* 105 107  CO2 '27 30 26  '$ --  25 22  GLUCOSE 132* 140* 120* 116* 117* 159*  BUN 16 20 59* 66* 43* 34*  CREATININE 0.76 0.71 2.03* 1.90* 1.31* 0.92  CALCIUM 9.2 9.0 8.0*  --  7.4* 7.4*  MG  --   --   --   --  1.7  --   AST  --   --  21  --  22 17  ALT  --   --  21  --  19 17  ALKPHOS  --   --  70  --  78 65  BILITOT  --   --  0.3  --  0.7 0.5   ------------------------------------------------------------------------------------------------------------------ estimated creatinine clearance is 64.2  mL/min (by C-G formula based on Cr of 0.92). ------------------------------------------------------------------------------------------------------------------ No results for input(s): HGBA1C in the last 72 hours. ------------------------------------------------------------------------------------------------------------------ No results for input(s): CHOL, HDL, LDLCALC, TRIG, CHOLHDL, LDLDIRECT in the last 72 hours. ------------------------------------------------------------------------------------------------------------------  Recent Labs  02/14/15 2213  TSH 1.034   ------------------------------------------------------------------------------------------------------------------ No results for input(s): VITAMINB12, FOLATE, FERRITIN, TIBC, IRON, RETICCTPCT in the last 72 hours.  Coagulation  profile  Recent Labs Lab 02/14/15 2213  INR 1.42    No results for input(s): DDIMER in the last 72 hours.  Cardiac Enzymes  Recent Labs Lab 02/14/15 2213 02/15/15 0645  TROPONINI <0.03  <0.03 <0.03   ------------------------------------------------------------------------------------------------------------------ Invalid input(s): POCBNP   CBG:  Recent Labs Lab 02/12/15 1149 02/12/15 1712 02/15/15 0042 02/15/15 0809 02/15/15 1222  GLUCAP 127* 128* 94 107* 110*       Studies: Ct Head Wo Contrast  02/14/2015  CLINICAL DATA:  Syncope and headache. EXAM: CT HEAD WITHOUT CONTRAST TECHNIQUE: Contiguous axial images were obtained from the base of the skull through the vertex without intravenous contrast. 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 Stable cortical atrophy. The brain demonstrates no evidence of hemorrhage, acute infarction, edema, mass effect, extra-axial fluid collection, hydrocephalus or mass lesion. The skull is unremarkable. IMPRESSION: No acute findings. 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 Electronically Signed   By: Aletta Edouard M.D.   On: 02/14/2015 17:51   Dg Chest Port 1 View  02/14/2015  CLINICAL DATA:  Pneumonia. EXAM: PORTABLE CHEST 1 VIEW COMPARISON:  02/10/2015 FINDINGS: Left lower lobe infiltrate again identified. This may be slightly smaller and less dense compared to the prior chest x-ray. No edema or pleural fluid. The heart size and mediastinal contours are normal. IMPRESSION: Slightly smaller and less dense appearing left lower lobe pneumonia. Electronically Signed   By: Aletta Edouard M.D.   On: 02/14/2015 15:18      Lab Results  Component Value Date   HGBA1C 7.0* 02/10/2015   HGBA1C 6.1* 09/29/2014   HGBA1C 7.8* 03/04/2014   Lab Results  Component Value Date   CREATININE 0.92 02/15/2015       Scheduled  Meds: . atorvastatin  10 mg Oral Daily  . DULoxetine  30 mg Oral QPM  . ferrous sulfate  325 mg Oral BID WC  . fluticasone  2 spray Each Nare Daily  . gabapentin  100 mg Oral QHS  . insulin aspart  0-9 Units Subcutaneous TID WC  . levothyroxine  50 mcg Oral QAC breakfast  . polyethylene glycol  17 g Oral Daily   Continuous Infusions: . sodium chloride Stopped (02/15/15 1228)  . ceFEPime (MAXIPIME) IV Stopped (02/15/15 0512)  . vancomycin 1,000 mg (02/15/15 0522)    Principal Problem:   Sepsis ([place]) Active Problems:   Type 2 diabetes mellitus ([place])   Hypothyroidism   Hypertension   Hyperlipidemia   History of depression   COPD with acute exacerbation ([facility])   Low back pain radiating to left leg    Time spent: 45 minutes   [facility] Pager [phone]. If 7PM-7AM, please contact night-coverage at [email or web address], password [facility] 02/15/2015, 1:44 PM  LOS: 1 day

## 2015-02-15 NOTE — Progress Notes (Signed)
Pt is requesting for regular food. Pt states that his Primary MD has instructed him he can have regular food because it is expensive. Dr. Allyson Sabal called and updated. No diet change. Had also clarified pt HGB at 7.6 to be addressed.  Hgb issue to be addressed  After  Abdominal MRI is done and  Has resulted.

## 2015-02-15 NOTE — Progress Notes (Signed)
Utilization review completed. Levina Boyack, RN, BSN. 

## 2015-02-16 ENCOUNTER — Inpatient Hospital Stay (HOSPITAL_COMMUNITY): Payer: Medicare Other

## 2015-02-16 ENCOUNTER — Ambulatory Visit: Payer: Medicare Other | Admitting: Oncology

## 2015-02-16 DIAGNOSIS — D649 Anemia, unspecified: Secondary | ICD-10-CM

## 2015-02-16 DIAGNOSIS — M899 Disorder of bone, unspecified: Secondary | ICD-10-CM

## 2015-02-16 DIAGNOSIS — D72829 Elevated white blood cell count, unspecified: Secondary | ICD-10-CM

## 2015-02-16 LAB — CBC
HCT: 21.5 % — ABNORMAL LOW (ref 39.0–52.0)
HEMATOCRIT: 20.3 % — AB (ref 39.0–52.0)
HEMOGLOBIN: 6.6 g/dL — AB (ref 13.0–17.0)
HEMOGLOBIN: 6.7 g/dL — AB (ref 13.0–17.0)
MCH: 28.3 pg (ref 26.0–34.0)
MCH: 28.4 pg (ref 26.0–34.0)
MCHC: 31.2 g/dL (ref 30.0–36.0)
MCHC: 32.5 g/dL (ref 30.0–36.0)
MCV: 87.1 fL (ref 78.0–100.0)
MCV: 91.1 fL (ref 78.0–100.0)
PLATELETS: 198 10*3/uL (ref 150–400)
Platelets: 237 10*3/uL (ref 150–400)
RBC: 2.36 MIL/uL — ABNORMAL LOW (ref 4.22–5.81)
RBC: 2.33 MIL/uL — ABNORMAL LOW (ref 4.22–5.81)
RDW: 15 % (ref 11.5–15.5)
RDW: 15.8 % — AB (ref 11.5–15.5)
WBC: 24.5 10*3/uL — ABNORMAL HIGH (ref 4.0–10.5)
WBC: 25 10*3/uL — AB (ref 4.0–10.5)

## 2015-02-16 LAB — HEMOGLOBIN AND HEMATOCRIT, BLOOD
HCT: 22.9 % — ABNORMAL LOW (ref 39.0–52.0)
HCT: 25.2 % — ABNORMAL LOW (ref 39.0–52.0)
Hemoglobin: 7.2 g/dL — ABNORMAL LOW (ref 13.0–17.0)
Hemoglobin: 8.5 g/dL — ABNORMAL LOW (ref 13.0–17.0)

## 2015-02-16 LAB — BASIC METABOLIC PANEL
Anion gap: 5 (ref 5–15)
BUN: 50 mg/dL — AB (ref 6–20)
CHLORIDE: 109 mmol/L (ref 101–111)
CO2: 23 mmol/L (ref 22–32)
Calcium: 7.4 mg/dL — ABNORMAL LOW (ref 8.9–10.3)
Creatinine, Ser: 0.92 mg/dL (ref 0.61–1.24)
GFR calc non Af Amer: >60 mL/min (ref >60)
Glucose, Bld: 224 mg/dL — ABNORMAL HIGH (ref 65–99)
POTASSIUM: 5 mmol/L (ref 3.5–5.1)
SODIUM: 137 mmol/L (ref 135–145)

## 2015-02-16 LAB — PREPARE RBC (CROSSMATCH)

## 2015-02-16 LAB — GLUCOSE, CAPILLARY
GLUCOSE-CAPILLARY: 172 mg/dL — AB (ref 65–99)
Glucose-Capillary: 166 mg/dL — ABNORMAL HIGH (ref 65–99)
Glucose-Capillary: 142 mg/dL — ABNORMAL HIGH (ref 65–99)
Glucose-Capillary: 143 mg/dL — ABNORMAL HIGH (ref 65–99)

## 2015-02-16 LAB — URINE CULTURE: CULTURE: NO GROWTH

## 2015-02-16 LAB — HEMOGLOBIN A1C
Hgb A1c MFr Bld: 7.1 % — ABNORMAL HIGH (ref 4.8–5.6)
Mean Plasma Glucose: 157 mg/dL

## 2015-02-16 MED ORDER — SODIUM CHLORIDE 0.9 % IV SOLN
8.0000 mg/h | INTRAVENOUS | Status: AC
  Administered 2015-02-16 – 2015-02-19 (×6): 8 mg/h via INTRAVENOUS
  Filled 2015-02-16 (×13): qty 80

## 2015-02-16 MED ORDER — CHLORHEXIDINE GLUCONATE CLOTH 2 % EX PADS
6.0000 | MEDICATED_PAD | Freq: Every day | CUTANEOUS | Status: AC
  Administered 2015-02-16 – 2015-02-20 (×5): 6 via TOPICAL

## 2015-02-16 MED ORDER — METRONIDAZOLE IN NACL 5-0.79 MG/ML-% IV SOLN
500.0000 mg | Freq: Three times a day (TID) | INTRAVENOUS | Status: DC
  Administered 2015-02-16 – 2015-02-19 (×9): 500 mg via INTRAVENOUS
  Filled 2015-02-16 (×11): qty 100

## 2015-02-16 MED ORDER — GLUCERNA SHAKE PO LIQD
237.0000 mL | Freq: Three times a day (TID) | ORAL | Status: DC
  Administered 2015-02-16 – 2015-02-20 (×7): 237 mL via ORAL

## 2015-02-16 MED ORDER — SODIUM CHLORIDE 0.9 % IV SOLN
Freq: Once | INTRAVENOUS | Status: AC
  Administered 2015-02-16: 08:00:00 via INTRAVENOUS

## 2015-02-16 MED ORDER — SODIUM CHLORIDE 0.9 % IV BOLUS (SEPSIS)
500.0000 mL | Freq: Once | INTRAVENOUS | Status: AC
  Administered 2015-02-16: 500 mL via INTRAVENOUS

## 2015-02-16 MED ORDER — SODIUM CHLORIDE 0.9 % IV SOLN
80.0000 mg | Freq: Once | INTRAVENOUS | Status: AC
  Administered 2015-02-16: 80 mg via INTRAVENOUS
  Filled 2015-02-16 (×2): qty 80

## 2015-02-16 MED ORDER — PANTOPRAZOLE SODIUM 40 MG IV SOLR
40.0000 mg | Freq: Two times a day (BID) | INTRAVENOUS | Status: DC
  Administered 2015-02-16: 40 mg via INTRAVENOUS
  Filled 2015-02-16: qty 40

## 2015-02-16 MED ORDER — SODIUM CHLORIDE 0.9 % IV SOLN: INTRAVENOUS | Status: DC

## 2015-02-16 MED ORDER — SODIUM CHLORIDE 0.9 % IV SOLN
Freq: Once | INTRAVENOUS | Status: AC
  Administered 2015-02-16: 02:00:00 via INTRAVENOUS

## 2015-02-16 MED ORDER — PANTOPRAZOLE SODIUM 40 MG IV SOLR
40.0000 mg | Freq: Two times a day (BID) | INTRAVENOUS | Status: DC
  Administered 2015-02-19 – 2015-02-22 (×6): 40 mg via INTRAVENOUS
  Filled 2015-02-16 (×6): qty 40

## 2015-02-16 MED ORDER — MUPIROCIN 2 % EX OINT
1.0000 "application " | TOPICAL_OINTMENT | Freq: Two times a day (BID) | CUTANEOUS | Status: AC
  Administered 2015-02-16 – 2015-02-20 (×10): 1 via NASAL
  Filled 2015-02-16 (×4): qty 22

## 2015-02-16 NOTE — Progress Notes (Signed)
Initial Nutrition Assessment  DOCUMENTATION CODES:   Obesity unspecified  INTERVENTION:  -Glucerna Shake po TID, each supplement provides 220 kcal and 10 grams of protein -RD to continue to monitor for needs   NUTRITION DIAGNOSIS:   Inadequate oral intake related to inability to eat, poor appetite as evidenced by per patient/family report. GOAL:   Patient will meet greater than or equal to 90% of their needs  MONITOR:   PO intake, I & O's, Labs, Weight trends, Supplement acceptance  REASON FOR ASSESSMENT:   Malnutrition Screening Tool    ASSESSMENT:   82 year old male with a history of paroxysmal atrial fibrillation on xarelto, COPD, on nocturnal oxygen 2.5 L, diabetes mellitus, hypertension, recently admitted 1/6-1/8 for community-acquired pneumonia, left lower lobe, discharged home on levofloxacin for 7 days, who presents with weakness, persistent nausea, syncopal episode at home, and the patient remembers hitting the back of his head. Patient was found on the floor by his family, daughter brings him in states that patient has been weak and bed bound since his discharge. Due to nausea the patient has not been eating or drinking. No diarrhea, continues to have pleuritic chest pain when he coughs. Denies any headache, blurry vision, slurred speech. Patient has a history of chronic low back pain, followed by Dr. [name], had an MRI 2 weeks ago, was found to have lesions concerning for metastatic disease and was referred to Dr. [name] for further evaluation. He has not been seen by oncology and had an upcoming appointment next week. Patient states that he has been compliant with all his medications including HCTZ, levofloxacin.  Spoke with pt & family at bedside. Since pt was previously discharged, he has been weak, unable to leave bed, unable to eat since he lives alone. Previous diagnosis of CAP has severely harmed his appetite. His fall prior to this admission caused additional  problems; he has been persistently nauseated. Pt reports a 20# wt loss but according to chart he has gained 6# back since previous admission. This comes up to a 14#/6% insignificant wt loss in 7 months.  Nutrition-Focused physical exam completed. Findings are no fat depletion, no muscle depletion, and no edema.   Labs and medications reviewed    Diet Order:  Diet Carb Modified Fluid consistency:: Thin; Room service appropriate?: Yes  Skin:  Wound (see comment) (Skin tear to L Elbow)  Last BM:  1/11  Height:   Ht Readings from Last 1 Encounters:  02/14/15 '5\' 7"'$  (1.702 m)    Weight:   Wt Readings from Last 1 Encounters:  02/16/15 206 lb (93.441 kg)    Ideal Body Weight:  67.3 kg  BMI:  Body mass index is 32.26 kg/(m^2).  Estimated Nutritional Needs:   Kcal:  1800-2000  Protein:  90-100 grams  Fluid:  >./= 1.8L  EDUCATION NEEDS:   No education needs identified at this time  Satira Anis. Roslind Michaux, MS, RD LDN After Hours/Weekend Pager [phone]

## 2015-02-16 NOTE — Progress Notes (Signed)
OT Cancellation Note  Patient Details Name: Taylor [name] MRN: [id number] DOB: 12-Sep-1928   Cancelled Treatment:    Reason Eval/Treat Not Completed: Medical issues which prohibited therapy (See PT note)  [place], OTR/L  [phone] 02/16/2015 02/16/2015, 11:41 AM

## 2015-02-16 NOTE — Progress Notes (Signed)
PT Cancellation Note  Patient Details Name: CASSADY TURANO MRN: [id number] DOB: February 04, 1929   Cancelled Treatment:    Reason Eval/Treat Not Completed: Patient not medically ready.  Pt currently with low Hgb and BP.  Spoke with RN and will hold PT at this time and f/u as appropriate.     Rogen Porte, Thornton Papas 02/16/2015, 11:30 AM

## 2015-02-16 NOTE — Progress Notes (Signed)
Lab called to notify hemoglobin 6.6, Dr. Allyson Sabal notified.

## 2015-02-16 NOTE — Progress Notes (Signed)
Pt resting in bed BP 85/45 prior to 1 unit of PRBC being started. Np on call made aware. New order received for a 500 cc bolus and to start blood. Will cont to monitor pt.

## 2015-02-16 NOTE — Consult Note (Signed)
Reason for Consult: Anemia, Heme positive stool Referring Physician: [name]  [name] HPI: This is an 82 year old Taylor [name] with a PMH of COPD requiring 2.5 liters of oxygen in the evenings, DM, HTN, paroxysmal afib on Xarelto, and a recent CAP readmitted for complaints of weakness, nausea, and syncope.  He hit the back of his head and he was discovered by his daughter a few seconds later as she heard him fall.  The patient complained of back pain and further evaluation with an MRI revealed a metastatic lesion.  Dr. [name] evaluated the patient here in the hospital as he was supposed to be evaluated as an outpatient next week.  A CT scan revealed some distal gastric thickening/stranding.  His HGB has declined to 6.6 from a baseline of 8-10 g/dL.  Dr. [name] thought it may be beneficial for the patient to undergo an EGD for further evaluation. There is also a possibility of Multiple Myeloma with the bone lesions and anemia.  The patient denies a prior history of PUD and he has never had a colonoscopy.  There is no family history of colon cancer.  He inquired with his PCP 5 years about a colonoscopy, but with his age and being asymptomatic the procedure was not recommended.  Past Medical History  Diagnosis Date  . Hypertension   . Hyperlipidemia   . Hypothyroidism   . COPD (chronic obstructive pulmonary disease) ([place])   . Atrial fibrillation ([place])   . Glaucoma   . Anxiety   . History of bladder cancer   . Lung nodule     SMALL  . OSA (obstructive sleep apnea)   . Heart murmur   . Dysrhythmia     Hx of Atrial Flutter  . Deep vein blood clot of left lower extremity ([place])   . On home oxygen therapy     At night, during the day PRN 2.5L  . Cancer ([place])     bladder cancer, skin cancer  . Diabetes mellitus without complication (HCC)     Type 2  . Depression   . Arthritis   . Osteoporosis   . Pneumonia 02/2015  . Kidney stones 2016    Past Surgical History  Procedure  Laterality Date  . Cardiovascular stress test  12/25/2007    EF 64%. NO EVIDENCE OF ISCHEMIA  . Cystoscopy N/A 12/15  . Varicose vein surgery      at 82 years old first, and about 20 years ago  . Back surgery  10-18-11    [facility]  . Tonsillectomy    . Eye surgery Bilateral     cataracts  . Multiple tooth extractions    . Total knee arthroplasty Left 10/11/2014    Procedure: TOTAL KNEE ARTHROPLASTY;  Surgeon: Renette Butters, MD;  Location: [facility];  Service: Orthopedics;  Laterality: Left;  [facility] Vascular surgery      Family History  Problem Relation Age of Onset  . Cancer - Lung Mother     46  . Heart attack Mother   . Heart Problems Father   . Diabetes Brother   . Alcoholism Brother   . Other Daughter     [place]  . Other Other     HALF SISTER [facility]    Social History:  reports that he quit smoking about 4 years ago. His smoking use included Cigarettes. He has a 90 pack-year smoking history. He has never used smokeless tobacco. He reports that he  does not drink alcohol or use illicit drugs.  Allergies:  Allergies  Allergen Reactions  . Amoxicillin Palpitations  . Motrin [Ibuprofen] Palpitations  . Chantix [Varenicline Tartrate] Other (See Comments)    depression    Medications:  Scheduled: . atorvastatin  10 mg Oral Daily  . ceFEPime (MAXIPIME) IV  2 g Intravenous Q12H  . Chlorhexidine Gluconate Cloth  6 each Topical Q0600  . diltiazem  10 mg Intravenous Once  . diltiazem  30 mg Oral 4 times per day  . DULoxetine  30 mg Oral QPM  . feeding supplement (ENSURE ENLIVE)  237 mL Oral BID BM  . feeding supplement (GLUCERNA SHAKE)  237 mL Oral TID BM  . ferrous sulfate  325 mg Oral BID WC  . fluticasone  2 spray Each Nare Daily  . gabapentin  100 mg Oral QHS  . insulin aspart  0-9 Units Subcutaneous TID WC  . levothyroxine  50 mcg Oral QAC breakfast  . metronidazole  500 mg Intravenous Q8H  . mupirocin ointment  1 application Nasal BID  . [START ON  02/19/2015] pantoprazole (PROTONIX) IV  40 mg Intravenous Q12H  . polyethylene glycol  Taylor g Oral Daily  . vancomycin  1,000 mg Intravenous Q12H   Continuous: . sodium chloride 125 mL/hr at 01/12/Taylor 0100  . pantoprozole (PROTONIX) infusion 8 mg/hr (01/12/Taylor 1314)    Results for orders placed or performed during the hospital encounter of 01/10/Taylor (from the past 24 hour(s))  Glucose, capillary     Status: Abnormal   Collection Time: 01/11/Taylor  9:47 PM  Result Value Ref Range   Glucose-Capillary 189 (H) 65 - 99 mg/dL  Occult blood card to lab, stool     Status: Abnormal   Collection Time: 01/11/Taylor 10:27 PM  Result Value Ref Range   Fecal Occult Bld POSITIVE (A) NEGATIVE  Type and screen [place]     Status: None (Preliminary result)   Collection Time: 01/11/Taylor 10:47 PM  Result Value Ref Range   ABO/RH(D) A POS    Antibody Screen NEG    Sample Expiration 02/18/2015    Unit Number [id number]    Blood Component Type RED CELLS,LR    Unit division 00    Status of Unit ISSUED    Transfusion Status OK TO TRANSFUSE    Crossmatch Result Compatible    Unit Number [id number]    Blood Component Type RED CELLS,LR    Unit division 00    Status of Unit ISSUED    Transfusion Status OK TO TRANSFUSE    Crossmatch Result Compatible    Unit Number [id number]    Blood Component Type RED CELLS,LR    Unit division 00    Status of Unit ISSUED    Transfusion Status OK TO TRANSFUSE    Crossmatch Result Compatible   CBC     Status: Abnormal   Collection Time: 01/11/Taylor 11:39 PM  Result Value Ref Range   WBC 24.5 (H) 4.0 - 10.5 K/uL   RBC 2.36 (L) 4.22 - 5.81 MIL/uL   Hemoglobin 6.7 (LL) 13.0 - Taylor.0 g/dL   HCT 21.5 (L) 39.0 - 52.0 %   MCV 91.1 78.0 - 100.0 fL   MCH 28.4 26.0 - 34.0 pg   MCHC 31.2 30.0 - 36.0 g/dL   RDW 15.0 11.5 - 15.5 %   Platelets 237 150 - 400 K/uL  Prepare RBC     Status: None   Collection Time: 01/12/Taylor  1:27 AM  Result Value Ref Range   Order  Confirmation ORDER PROCESSED BY BLOOD BANK   Hemoglobin and hematocrit, blood     Status: Abnormal   Collection Time: 01/12/Taylor  7:25 AM  Result Value Ref Range   Hemoglobin 7.2 (L) 13.0 - Taylor.0 g/dL   HCT 22.9 (L) 39.0 - 06.2 %  Basic metabolic panel     Status: Abnormal   Collection Time: 01/12/Taylor  7:25 AM  Result Value Ref Range   Sodium 137 135 - 145 mmol/L   Potassium 5.0 3.5 - 5.1 mmol/L   Chloride 109 101 - 111 mmol/L   CO2 23 22 - 32 mmol/L   Glucose, Bld 224 (H) 65 - 99 mg/dL   BUN 50 (H) 6 - 20 mg/dL   Creatinine, Ser 0.92 0.61 - 1.24 mg/dL   Calcium 7.4 (L) 8.9 - 10.3 mg/dL   GFR calc non Af Amer >60 >60 mL/min   GFR calc Af Amer >60 >60 mL/min   Anion gap 5 5 - 15  Glucose, capillary     Status: Abnormal   Collection Time: 01/12/Taylor  7:57 AM  Result Value Ref Range   Glucose-Capillary 172 (H) 65 - 99 mg/dL  Prepare RBC     Status: None   Collection Time: 01/12/Taylor  8:08 AM  Result Value Ref Range   Order Confirmation ORDER PROCESSED BY BLOOD BANK   CBC     Status: Abnormal   Collection Time: 01/12/Taylor 10:10 AM  Result Value Ref Range   WBC 25.0 (H) 4.0 - 10.5 K/uL   RBC 2.33 (L) 4.22 - 5.81 MIL/uL   Hemoglobin 6.6 (LL) 13.0 - Taylor.0 g/dL   HCT 20.3 (L) 39.0 - 52.0 %   MCV 87.1 78.0 - 100.0 fL   MCH 28.3 26.0 - 34.0 pg   MCHC 32.5 30.0 - 36.0 g/dL   RDW 15.8 (H) 11.5 - 15.5 %   Platelets 198 150 - 400 K/uL  Glucose, capillary     Status: Abnormal   Collection Time: 01/12/Taylor 11:23 AM  Result Value Ref Range   Glucose-Capillary 166 (H) 65 - 99 mg/dL     Ct Abdomen Pelvis Wo Contrast  02/16/2015  CLINICAL DATA:  Anemia, leukocytosis and tarry stools. Recent admission for sepsis. History of COPD, diabetes and hypertension. History of bladder cancer and plasma cell disorder. EXAM: CT CHEST, ABDOMEN AND PELVIS WITHOUT CONTRAST TECHNIQUE: Multidetector CT imaging of the chest, abdomen and pelvis was performed following the standard protocol without IV contrast. COMPARISON:   Chest CT 12/15/2007, abdominal pelvic CT 06/30/2014 and chest radiographs 02/10/2015. Skeletal survey 05/24/2014. FINDINGS: CT CHEST Mediastinum/Nodes: There are no enlarged mediastinal, hilar or axillary lymph nodes. Small mediastinal lymph nodes are stable. The thyroid gland, trachea and esophagus demonstrate no significant findings. The heart size is normal. There is no pericardial effusion. There is extensive atherosclerosis of the aorta, great vessels and coronary arteries. Lungs/Pleura: There is minimal dependent pleural fluid bilaterally. As demonstrated on recent radiographs, there is airspace disease with consolidation and air bronchograms in the left lower lobe. There is minimal dependent atelectasis at the right lung base. A 6 mm left upper lobe nodule on image number 16 is stable. There are no new or enlarging pulmonary nodules. Mild emphysematous changes are present. Musculoskeletal/Chest wall: There is a T9 compression fracture which appears new and likely pathologic. There are multiple irregular lucent and sclerotic lesions throughout the thoracic spine and ribs. Small purely lytic lesion noted anteriorly in the left  third rib (image 21). CT ABDOMEN AND PELVIS FINDINGS Hepatobiliary: As evaluated in the noncontrast state, the liver appears stable without suspicious findings. Small calcified gallstones are again noted. There is no gallbladder wall thickening or significant biliary dilatation. Pancreas: Unremarkable. No pancreatic ductal dilatation or surrounding inflammatory changes. Spleen: Normal in size without focal abnormality. Adrenals/Urinary Tract: Chronic right adrenal mass appears unchanged, measuring 3.4 x 3.1 cm on image 51. This contains calcifications, soft tissue and fatty components. 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 The left adrenal gland appears normal. Multiple bilateral renal cysts are again noted, measuring up to 6.0 cm in the lower  pole of the right kidney and up to 5.8 cm in the interpolar region of the left kidney. Both kidneys demonstrate mild cortical thinning. There is no evidence of urinary tract calculus or hydronephrosis. The bladder appears normal. Stomach/Bowel: There appears to be mild soft tissue stranding around the distal stomach, proximal duodenum and pancreatic head. This is suboptimally evaluated due to the lack of intravenous and oral contrast. There is no extraluminal fluid collection. No evidence of bowel wall thickening, distention or surrounding inflammatory change. Stable diverticular changes of the sigmoid colon. Vascular/Lymphatic: There are no enlarged abdominal or pelvic lymph nodes. Stable extensive atherosclerosis of the aorta, its branches and the iliac arteries. There is stable dilatation of the right common iliac artery. Reproductive: Unremarkable. Other: Stable asymmetric fat in the left inguinal canal and stable small umbilical hernia containing only fat. No evidence of ascites. Musculoskeletal: There are several new mixed lytic and sclerotic lesions within the lumbar spine. There is a dominant 2.2 cm sclerotic lesion in the S1 segment. Postsurgical changes are noted in the lower lumbar spine. There is no evidence of lumbar pathologic fracture or epidural tumor. IMPRESSION: 1. Possible inflammation involving the distal stomach and proximal duodenum in this patient with tarry stools. This is suboptimally evaluated by this examination. Consider endoscopic correlation. 2. The remainder of the small bowel and colon demonstrate no significant findings. There is distal colonic diverticulosis. 3. Left lower lobe pneumonia.  Small bilateral pleural effusions. 4. Multiple lytic and sclerotic lesions in the spine, ribs and pelvis, suspicious for metastatic disease or multiple myeloma. Correlate clinically. Probable pathologic fracture at T9. 5. Stable additional incidental findings including extensive atherosclerosis,  cholelithiasis and a chronic right adrenal mass. Electronically Signed   By: Richardean Sale M.D.   On: 02/16/2015 09:45   Ct Head Wo Contrast  02/14/2015  CLINICAL DATA:  Syncope and headache. EXAM: CT HEAD WITHOUT CONTRAST TECHNIQUE: Contiguous axial images were obtained from the base of the skull through the vertex without intravenous contrast. 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 Stable cortical atrophy. The brain demonstrates no evidence of hemorrhage, acute infarction, edema, mass effect, extra-axial fluid collection, hydrocephalus or mass lesion. The skull is unremarkable. IMPRESSION: No acute findings. 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 Electronically Signed   By: Aletta Edouard M.D.   On: 02/14/2015 Taylor:51   Ct Chest Wo Contrast  02/16/2015  CLINICAL DATA:  Anemia, leukocytosis and tarry stools. Recent admission for sepsis. History of COPD, diabetes and hypertension. History of bladder cancer and plasma cell disorder. EXAM: CT CHEST, ABDOMEN AND PELVIS WITHOUT CONTRAST TECHNIQUE: Multidetector CT imaging of the chest, abdomen and pelvis was performed following the standard protocol without IV contrast. COMPARISON:  Chest CT  12/15/2007, abdominal pelvic CT 06/30/2014 and chest radiographs 02/10/2015. Skeletal survey 05/24/2014. FINDINGS: CT CHEST Mediastinum/Nodes: There are no enlarged mediastinal, hilar or axillary lymph nodes. Small mediastinal lymph nodes are stable. The thyroid gland, trachea and esophagus demonstrate no significant findings. The heart size is normal. There is no pericardial effusion. There is extensive atherosclerosis of the aorta, great vessels and coronary arteries. Lungs/Pleura: There is minimal dependent pleural fluid bilaterally. As demonstrated on recent radiographs, there is airspace disease with consolidation and air bronchograms in the left lower  lobe. There is minimal dependent atelectasis at the right lung base. A 6 mm left upper lobe nodule on image number 16 is stable. There are no new or enlarging pulmonary nodules. Mild emphysematous changes are present. Musculoskeletal/Chest wall: There is a T9 compression fracture which appears new and likely pathologic. There are multiple irregular lucent and sclerotic lesions throughout the thoracic spine and ribs. Small purely lytic lesion noted anteriorly in the left third rib (image 21). CT ABDOMEN AND PELVIS FINDINGS Hepatobiliary: As evaluated in the noncontrast state, the liver appears stable without suspicious findings. Small calcified gallstones are again noted. There is no gallbladder wall thickening or significant biliary dilatation. Pancreas: Unremarkable. No pancreatic ductal dilatation or surrounding inflammatory changes. Spleen: Normal in size without focal abnormality. Adrenals/Urinary Tract: Chronic right adrenal mass appears unchanged, measuring 3.4 x 3.1 cm on image 51. This contains calcifications, soft tissue and fatty components. 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 The left adrenal gland appears normal. Multiple bilateral renal cysts are again noted, measuring up to 6.0 cm in the lower pole of the right kidney and up to 5.8 cm in the interpolar region of the left kidney. Both kidneys demonstrate mild cortical thinning. There is no evidence of urinary tract calculus or hydronephrosis. The bladder appears normal. Stomach/Bowel: There appears to be mild soft tissue stranding around the distal stomach, proximal duodenum and pancreatic head. This is suboptimally evaluated due to the lack of intravenous and oral contrast. There is no extraluminal fluid collection. No evidence of bowel wall thickening, distention or surrounding inflammatory change. Stable diverticular changes of the sigmoid colon. Vascular/Lymphatic: There are no enlarged abdominal or  pelvic lymph nodes. Stable extensive atherosclerosis of the aorta, its branches and the iliac arteries. There is stable dilatation of the right common iliac artery. Reproductive: Unremarkable. Other: Stable asymmetric fat in the left inguinal canal and stable small umbilical hernia containing only fat. No evidence of ascites. Musculoskeletal: There are several new mixed lytic and sclerotic lesions within the lumbar spine. There is a dominant 2.2 cm sclerotic lesion in the S1 segment. Postsurgical changes are noted in the lower lumbar spine. There is no evidence of lumbar pathologic fracture or epidural tumor. IMPRESSION: 1. Possible inflammation involving the distal stomach and proximal duodenum in this patient with tarry stools. This is suboptimally evaluated by this examination. Consider endoscopic correlation. 2. The remainder of the small bowel and colon demonstrate no significant findings. There is distal colonic diverticulosis. 3. Left lower lobe pneumonia.  Small bilateral pleural effusions. 4. Multiple lytic and sclerotic lesions in the spine, ribs and pelvis, suspicious for metastatic disease or multiple myeloma. Correlate clinically. Probable pathologic fracture at T9. 5. Stable additional incidental findings including extensive atherosclerosis, cholelithiasis and a chronic right adrenal mass. Electronically Signed   By: Richardean Sale M.D.   On: 02/16/2015 09:45    ROS:  As stated above in the HPI otherwise negative.  Blood pressure  97/40, pulse 94, temperature 99.6 F (37.6 C), temperature source Axillary, resp. rate Taylor, height _0  (1.702 m), weight 93.441 kg (206 lb), SpO2 98 %.    PE: Gen: NAD, Alert and Oriented, pale and weak appearing HEENT:  [place]/AT, EOMI Neck: Supple, no LAD Lungs: CTA Bilaterally CV: RRR without M/G/R ABM: Soft, NTND, +BS Ext: No C/C/E  Assessment/Plan: 1) Anemia. 2) Heme positive stool. 3) COPD. 4) Abnormal CT scan.   The patient is very frail and weak.   An EGD is not unreasonable with the current findings of anemia, heme positivity, and abnormal CT scan.  I told the patient and his family that I will need to keep his sedation very light as his pulmonary status is poor.    Plan: 1) EGD tomorrow. 2) Follow HGB and transfuse as necessary.  Pio Eatherly D 02/16/2015, 4:33 PM

## 2015-02-16 NOTE — Progress Notes (Addendum)
[name] PROGRESS NOTE  Taylor [name] IEP:[id number] DOB: [birth year] DOA: 02/14/2015 PCP: [name], MD  Length of stay: 2   Assessment/Plan: Principal Problem:   Sepsis ([place]) Active Problems:   Type 2 diabetes mellitus ([place])   Hypothyroidism   Hypertension   Hyperlipidemia   History of depression   COPD with acute exacerbation ([place])   Low back pain radiating to left leg    HPI:  82 year old male with a history of paroxysmal atrial fibrillation on xarelto, COPD, on nocturnal oxygen 2.5 L, diabetes mellitus, hypertension, recently admitted 1/6-1/8 for community-acquired pneumonia, left lower lobe, discharged home on levofloxacin for 7 days, who presents with weakness, persistent nausea, syncopal episode at home, and the patient remembers hitting the back of his head. Patient was found on the floor by his family, daughter brings him in states that patient has been weak and bed bound since his discharge. Due to nausea the patient has not been eating or drinking. No diarrhea, continues to have pleuritic chest pain when he coughs. Denies any headache, blurry vision, slurred speech. Patient has a history of chronic low back pain, followed by Dr. [name], had an MRI 2 weeks ago, was found to have lesions concerning for metastatic disease and was referred to Dr. [name] for further evaluation. He has not been seen by oncology and had an upcoming appointment next week. Patient states that he has been compliant with all his medications including HCTZ, levofloxacin. On arrival systolic blood pressure was 64 /34. Sepsis protocol was initiated. White count appears to be persistently high at 17.9. Hemoglobin has dropped from 10.4>9.2. Creatinine has increased from 0.7>2.03. Lactate 2.86.    Assessment and plan Sepsis with hypovolemic shock, hemorrhagic shock Likely in the setting of recent left lower lobe pneumonia with incomplete resolution  , UA negative  White  count still elevated, lactate on admit 2.86 >1.6, creatinine 0.71> on admit 2.03>0.92  , Pro calcitonin <0.10 Compliant with Levaquin at home, given poor oral intake and nausea the patient appears severely dehydrated Blood culture from 1/6 remain negative during previous hospitalization Follow  blood culture 2, broad-spectrum antibiotics patient initiated on cefepime and vancomycin, day 3 White blood cell count has increased, added Flagyl to the above antibiotics Discussed with Dr. [name] who recently performed MRI of the patient's lumbar spine and there was no evidence of discitis  CT confirms   the left lower lobe pneumonia   Syncope likely secondary to orthostatic hypotension in the setting of pneumonia/sepsis, negative  troponin, 2-D echo, doubt PE was getting xarelto ,CT head negative    Community-acquired pneumonia with underlying history of COPD, chronic respiratory failure - Repeat Chest x-ray showed some improvement in left lower lobe pneumonia Placed on levofloxacin for 7 days, see #1 - Previous Influenza panel negative, urine strep antigen negative.  -Previous Pro calcitonin less than 0.1, lactic acid 1.0. Lactate is now 2.86 - Previous Blood cultures remain negative to date.     Hyperkalemia  -Previously K+ 6.5 , now 4.3  Placed on low-dose of hydrochlorothiazide 12.5 mg daily prior to DC on 1/8 .   Severe COPD with chronic respiratory failure - Using 2.5 Lpm supplemental O2 at night while home  - Continue duo nebs, wean O2 as tolerated  Type II DM  - Holding home metformin while inpt  - Continue sliding scale insulin, hemoglobin A1c 7.0 Anion gap 11  Fall-given that the patient is on xarelto  CT head  done to  rule out intracranial bleeding   Normocytic Anemia , likely now with acute blood loss anemia, malignancy  Hemoglobin was 10.8 on 1/6, dropped to 6.7 on 1/ 11 Discontinued xarelto, transfused 3 units of packed red blood cells CT shows possible  inflammation of the distal stomach and proximal duodenum, will consult GI CT shows multiple lytic and sclerotic lesions in the spine and ribs and pelvis suspicious for metastatic disease or multiple myeloma  Repeat  SPEP /UPEP, Consider bone surveryOncology consultation  Paroxysmal atrial fib  - Currently in sinus,holding diltiazem due to hypotension, discontinued  Xarelto  -Hold diltiazem. Patient is currently rate controlled - CHADS vasc 2, patient was not on any anticoagulation prior to his left knee total arthroplasty and was placed on xarelto for DVT prophylaxis. Patient was continued on xarelto while inpatient however, he may discuss with his primary cardiologist, Dr Acie Fredrickson, whether to stay on aspirin versus anti-coagulation Discharged home on Xarelto after last admission  Hypothyroidism Continue Synthroid  Adrenal mass, on MRI of the lumbar spine as per Dr. [name] Will obtain MRI of the abdomen to rule out renal mass, patient has history of bladder cancer Followed by Dr. Larinda Buttery  Urology and Dr [name]     DVT prophylaxsis Xarelto  Code Status:      Code Status Orders        Start     Ordered   02/14/15 1720  Full code   Continuous     02/14/15 1721    Code Status History    Date Active Date Inactive Code Status Order ID Comments User Context   02/10/2015  7:35 PM 02/12/2015  9:27 PM Full Code [id number]  Vianne Bulls, MD ED   10/11/2014  2:58 PM 10/13/2014  6:39 PM Full Code [id number]  Lovett Calender, PA-C Inpatient   03/02/2014 11:23 PM 03/07/2014  7:29 PM Full Code [id number]  Leanna Battles, MD ED      Family Communication: Discussed in detail with the patient, all imaging results, lab results explained to the patient   Disposition Plan:  Transfer to stepdown, updates daughter [phone], [facility], consult oncology, palliative , GI     Consultants:  None  Procedures:  None  Antibiotics: Anti-infectives    Start     Dose/Rate Route  Frequency Ordered Stop   02/16/15 0900  metroNIDAZOLE (FLAGYL) IVPB 500 mg     500 mg 100 mL/hr over 60 Minutes Intravenous Every 8 hours 02/16/15 0831     02/15/15 0400  vancomycin (VANCOCIN) IVPB 1000 mg/200 mL premix     1,000 mg 200 mL/hr over 60 Minutes Intravenous Every 12 hours 02/14/15 1457     02/15/15 0330  ceFEPIme (MAXIPIME) 2 g in dextrose 5 % 50 mL IVPB     2 g 100 mL/hr over 30 Minutes Intravenous Every 12 hours 02/14/15 1457     02/14/15 1530  vancomycin (VANCOCIN) 1,750 mg in sodium chloride 0.9 % 500 mL IVPB     1,750 mg 250 mL/hr over 120 Minutes Intravenous  Once 02/14/15 1449 02/14/15 1917   02/14/15 1500  ceFEPIme (MAXIPIME) 2 g in dextrose 5 % 50 mL IVPB     2 g 100 mL/hr over 30 Minutes Intravenous  Once 02/14/15 1446 02/14/15 1613   02/14/15 1500  vancomycin (VANCOCIN) IVPB 1000 mg/200 mL premix  Status:  Discontinued     1,000 mg 200 mL/hr over 60 Minutes Intravenous  Once 02/14/15 1446 02/14/15 1449  HPI/Subjective: Tachycardic last night , hypotensive ,hg has dropped , received 1 unit of packed red blood cells last night, large black tarry stool last night   Objective: Filed Vitals:   02/16/15 0526 02/16/15 0610 02/16/15 0649 02/16/15 0753  BP: 97/47 97/47 97/48  99/44  Pulse:    29  Temp:      TempSrc:      Resp: 17 17 26 16   Height:      Weight:  93.441 kg (206 lb)    SpO2:    98%    Intake/Output Summary (Last 24 hours) at 02/16/15 1041 Last data filed at 02/16/15 0454  Gross per 24 hour  Intake 4091.42 ml  Output    820 ml  Net 3271.42 ml    Exam:  General: No acute respiratory distress Lungs: Clear to auscultation bilaterally without wheezes or crackles Cardiovascular: Regular rate and rhythm without murmur gallop or rub normal S1 and S2 Abdomen: Nontender, nondistended, soft, bowel sounds positive, no rebound, no ascites, no appreciable mass Extremities: No significant cyanosis, clubbing, or edema bilateral lower  extremities     Data Review   Micro Results Recent Results (from the past 240 hour(s))  Culture, blood (routine x 2) Call MD if unable to obtain prior to antibiotics being given     Status: None   Collection Time: 02/10/15  9:55 PM  Result Value Ref Range Status   Specimen Description BLOOD LEFT ANTECUBITAL  Final   Special Requests BOTTLES DRAWN AEROBIC AND ANAEROBIC 5CC  Final   Culture NO GROWTH 5 DAYS  Final   Report Status 02/15/2015 FINAL  Final  Culture, blood (routine x 2) Call MD if unable to obtain prior to antibiotics being given     Status: None   Collection Time: 02/10/15 10:02 PM  Result Value Ref Range Status   Specimen Description BLOOD RIGHT ARM  Final   Special Requests IN PEDIATRIC BOTTLE [place]  Final   Culture NO GROWTH 5 DAYS  Final   Report Status 02/15/2015 FINAL  Final  MRSA PCR Screening     Status: Abnormal   Collection Time: 02/11/15  4:48 AM  Result Value Ref Range Status   MRSA by PCR POSITIVE (A) NEGATIVE Final    Comment:        The GeneXpert MRSA Assay (FDA approved for NASAL specimens only), is one component of a comprehensive MRSA colonization surveillance program. It is not intended to diagnose MRSA infection nor to guide or monitor treatment for MRSA infections. RESULT CALLED TO, READ BACK BY AND VERIFIED WITH: R HANEY @0649  02/05/15 MKELLY   Blood Culture (routine x 2)     Status: None (Preliminary result)   Collection Time: 02/14/15  3:05 PM  Result Value Ref Range Status   Specimen Description BLOOD LEFT HAND  Final   Special Requests BOTTLES DRAWN AEROBIC AND ANAEROBIC 4CC  Final   Culture NO GROWTH < 24 HOURS  Final   Report Status PENDING  Incomplete  Blood Culture (routine x 2)     Status: None (Preliminary result)   Collection Time: 02/14/15  3:30 PM  Result Value Ref Range Status   Specimen Description BLOOD LEFT WRIST  Final   Special Requests BOTTLES DRAWN AEROBIC AND ANAEROBIC 5CC  Final   Culture NO GROWTH < 24 HOURS   Final   Report Status PENDING  Incomplete  Urine culture     Status: None   Collection Time: 02/14/15  6:41 PM  Result Value  Ref Range Status   Specimen Description URINE, RANDOM  Final   Special Requests NONE  Final   Culture 3,000 COLONIES/mL INSIGNIFICANT GROWTH  Final   Report Status 02/15/2015 FINAL  Final    Radiology Reports Ct Abdomen Pelvis Wo Contrast  02/16/2015  CLINICAL DATA:  Anemia, leukocytosis and tarry stools. Recent admission for sepsis. History of COPD, diabetes and hypertension. History of bladder cancer and plasma cell disorder. EXAM: CT CHEST, ABDOMEN AND PELVIS WITHOUT CONTRAST TECHNIQUE: Multidetector CT imaging of the chest, abdomen and pelvis was performed following the standard protocol without IV contrast. COMPARISON:  Chest CT 12/15/2007, abdominal pelvic CT 06/30/2014 and chest radiographs 02/10/2015. Skeletal survey 05/24/2014. FINDINGS: CT CHEST Mediastinum/Nodes: There are no enlarged mediastinal, hilar or axillary lymph nodes. Small mediastinal lymph nodes are stable. The thyroid gland, trachea and esophagus demonstrate no significant findings. The heart size is normal. There is no pericardial effusion. There is extensive atherosclerosis of the aorta, great vessels and coronary arteries. Lungs/Pleura: There is minimal dependent pleural fluid bilaterally. As demonstrated on recent radiographs, there is airspace disease with consolidation and air bronchograms in the left lower lobe. There is minimal dependent atelectasis at the right lung base. A 6 mm left upper lobe nodule on image number 16 is stable. There are no new or enlarging pulmonary nodules. Mild emphysematous changes are present. Musculoskeletal/Chest wall: There is a T9 compression fracture which appears new and likely pathologic. There are multiple irregular lucent and sclerotic lesions throughout the thoracic spine and ribs. Small purely lytic lesion noted anteriorly in the left third rib (image 21). CT  ABDOMEN AND PELVIS FINDINGS Hepatobiliary: As evaluated in the noncontrast state, the liver appears stable without suspicious findings. Small calcified gallstones are again noted. There is no gallbladder wall thickening or significant biliary dilatation. Pancreas: Unremarkable. No pancreatic ductal dilatation or surrounding inflammatory changes. Spleen: Normal in size without focal abnormality. Adrenals/Urinary Tract: Chronic right adrenal mass appears unchanged, measuring 3.4 x 3.1 cm on image 51. This contains calcifications, soft tissue and fatty components. 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 The left adrenal gland appears normal. Multiple bilateral renal cysts are again noted, measuring up to 6.0 cm in the lower pole of the right kidney and up to 5.8 cm in the interpolar region of the left kidney. Both kidneys demonstrate mild cortical thinning. There is no evidence of urinary tract calculus or hydronephrosis. The bladder appears normal. Stomach/Bowel: There appears to be mild soft tissue stranding around the distal stomach, proximal duodenum and pancreatic head. This is suboptimally evaluated due to the lack of intravenous and oral contrast. There is no extraluminal fluid collection. No evidence of bowel wall thickening, distention or surrounding inflammatory change. Stable diverticular changes of the sigmoid colon. Vascular/Lymphatic: There are no enlarged abdominal or pelvic lymph nodes. Stable extensive atherosclerosis of the aorta, its branches and the iliac arteries. There is stable dilatation of the right common iliac artery. Reproductive: Unremarkable. Other: Stable asymmetric fat in the left inguinal canal and stable small umbilical hernia containing only fat. No evidence of ascites. Musculoskeletal: There are several new mixed lytic and sclerotic lesions within the lumbar spine. There is a dominant 2.2 cm sclerotic lesion in the S1 segment. Postsurgical  changes are noted in the lower lumbar spine. There is no evidence of lumbar pathologic fracture or epidural tumor. IMPRESSION: 1. Possible inflammation involving the distal stomach and proximal duodenum in this patient with tarry stools. This is suboptimally evaluated  by this examination. Consider endoscopic correlation. 2. The remainder of the small bowel and colon demonstrate no significant findings. There is distal colonic diverticulosis. 3. Left lower lobe pneumonia.  Small bilateral pleural effusions. 4. Multiple lytic and sclerotic lesions in the spine, ribs and pelvis, suspicious for metastatic disease or multiple myeloma. Correlate clinically. Probable pathologic fracture at T9. 5. Stable additional incidental findings including extensive atherosclerosis, cholelithiasis and a chronic right adrenal mass. Electronically Signed   By: Richardean Sale M.D.   On: 02/16/2015 09:45   Dg Chest 2 View  02/10/2015  CLINICAL DATA:  Left-sided chest pain in the mornings for the last few days. EXAM: CHEST  2 VIEW COMPARISON:  04/12/2011 FINDINGS: Heart size is normal. There is atherosclerosis of the aorta. The right lung is clear. There is abnormal density in the left lower lobe posteriorly most consistent with bronchopneumonia. Follow-up to clearing recommended to ensure there is not a mass lesion. No effusion. No acute bone finding. IMPRESSION: Left lower lobe infiltrate most consistent with bronchopneumonia. Follow-up to clearing to rule out underlying mass. Electronically Signed   By: Nelson Chimes M.D.   On: 02/10/2015 15:12   Ct Head Wo Contrast  02/14/2015  CLINICAL DATA:  Syncope and headache. EXAM: CT HEAD WITHOUT CONTRAST TECHNIQUE: Contiguous axial images were obtained from the base of the skull through the vertex without intravenous contrast. 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 Stable cortical atrophy. The brain  demonstrates no evidence of hemorrhage, acute infarction, edema, mass effect, extra-axial fluid collection, hydrocephalus or mass lesion. The skull is unremarkable. IMPRESSION: No acute findings. 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 Electronically Signed   By: Aletta Edouard M.D.   On: 02/14/2015 17:51   Ct Chest Wo Contrast  02/16/2015  CLINICAL DATA:  Anemia, leukocytosis and tarry stools. Recent admission for sepsis. History of COPD, diabetes and hypertension. History of bladder cancer and plasma cell disorder. EXAM: CT CHEST, ABDOMEN AND PELVIS WITHOUT CONTRAST TECHNIQUE: Multidetector CT imaging of the chest, abdomen and pelvis was performed following the standard protocol without IV contrast. COMPARISON:  Chest CT 12/15/2007, abdominal pelvic CT 06/30/2014 and chest radiographs 02/10/2015. Skeletal survey 05/24/2014. FINDINGS: CT CHEST Mediastinum/Nodes: There are no enlarged mediastinal, hilar or axillary lymph nodes. Small mediastinal lymph nodes are stable. The thyroid gland, trachea and esophagus demonstrate no significant findings. The heart size is normal. There is no pericardial effusion. There is extensive atherosclerosis of the aorta, great vessels and coronary arteries. Lungs/Pleura: There is minimal dependent pleural fluid bilaterally. As demonstrated on recent radiographs, there is airspace disease with consolidation and air bronchograms in the left lower lobe. There is minimal dependent atelectasis at the right lung base. A 6 mm left upper lobe nodule on image number 16 is stable. There are no new or enlarging pulmonary nodules. Mild emphysematous changes are present. Musculoskeletal/Chest wall: There is a T9 compression fracture which appears new and likely pathologic. There are multiple irregular lucent and sclerotic lesions throughout the thoracic spine and ribs. Small purely lytic lesion noted anteriorly in the left third rib (image 21). CT ABDOMEN  AND PELVIS FINDINGS Hepatobiliary: As evaluated in the noncontrast state, the liver appears stable without suspicious findings. Small calcified gallstones are again noted. There is no gallbladder wall thickening or significant biliary dilatation. Pancreas: Unremarkable. No pancreatic ductal dilatation or surrounding inflammatory changes. Spleen: Normal in size without focal  abnormality. Adrenals/Urinary Tract: Chronic right adrenal mass appears unchanged, measuring 3.4 x 3.1 cm on image 51. This contains calcifications, soft tissue and fatty components. 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 The left adrenal gland appears normal. Multiple bilateral renal cysts are again noted, measuring up to 6.0 cm in the lower pole of the right kidney and up to 5.8 cm in the interpolar region of the left kidney. Both kidneys demonstrate mild cortical thinning. There is no evidence of urinary tract calculus or hydronephrosis. The bladder appears normal. Stomach/Bowel: There appears to be mild soft tissue stranding around the distal stomach, proximal duodenum and pancreatic head. This is suboptimally evaluated due to the lack of intravenous and oral contrast. There is no extraluminal fluid collection. No evidence of bowel wall thickening, distention or surrounding inflammatory change. Stable diverticular changes of the sigmoid colon. Vascular/Lymphatic: There are no enlarged abdominal or pelvic lymph nodes. Stable extensive atherosclerosis of the aorta, its branches and the iliac arteries. There is stable dilatation of the right common iliac artery. Reproductive: Unremarkable. Other: Stable asymmetric fat in the left inguinal canal and stable small umbilical hernia containing only fat. No evidence of ascites. Musculoskeletal: There are several new mixed lytic and sclerotic lesions within the lumbar spine. There is a dominant 2.2 cm sclerotic lesion in the S1 segment. Postsurgical changes  are noted in the lower lumbar spine. There is no evidence of lumbar pathologic fracture or epidural tumor. IMPRESSION: 1. Possible inflammation involving the distal stomach and proximal duodenum in this patient with tarry stools. This is suboptimally evaluated by this examination. Consider endoscopic correlation. 2. The remainder of the small bowel and colon demonstrate no significant findings. There is distal colonic diverticulosis. 3. Left lower lobe pneumonia.  Small bilateral pleural effusions. 4. Multiple lytic and sclerotic lesions in the spine, ribs and pelvis, suspicious for metastatic disease or multiple myeloma. Correlate clinically. Probable pathologic fracture at T9. 5. Stable additional incidental findings including extensive atherosclerosis, cholelithiasis and a chronic right adrenal mass. Electronically Signed   By: Richardean Sale M.D.   On: 02/16/2015 09:45   Dg Chest Port 1 View  02/14/2015  CLINICAL DATA:  Pneumonia. EXAM: PORTABLE CHEST 1 VIEW COMPARISON:  02/10/2015 FINDINGS: Left lower lobe infiltrate again identified. This may be slightly smaller and less dense compared to the prior chest x-ray. No edema or pleural fluid. The heart size and mediastinal contours are normal. IMPRESSION: Slightly smaller and less dense appearing left lower lobe pneumonia. Electronically Signed   By: Aletta Edouard M.D.   On: 02/14/2015 15:18     CBC  Recent Labs Lab 02/10/15 1525 02/11/15 0323 02/12/15 0700 02/14/15 1530 02/14/15 1548 02/14/15 2213 02/15/15 0645 02/15/15 2339 02/16/15 0725  WBC 17.1* 16.1* 15.7* 17.9*  --  17.5* 16.0* 24.5*  --   HGB 10.8* 10.5* 10.4* 8.4* 9.2* 8.6* 7.6* 6.7* 7.2*  HCT 36.6* 35.7* 34.8* 27.9* 27.0* 28.6* 25.3* 21.5* 22.9*  PLT 265 232 229 236  --  204 210 237  --   MCV 93.8 93.5 92.8 90.9  --  90.8 90.7 91.1  --   MCH 27.7 27.5 27.7 27.4  --  27.3 27.2 28.4  --   MCHC 29.5* 29.4* 29.9* 30.1  --  30.1 30.0 31.2  --   RDW 14.8 14.7 14.6 15.0  --  15.0  15.1 15.0  --   LYMPHSABS 1.4 1.2  --  1.3  --  1.1  --   --   --  MONOABS 1.7* 1.6*  --  2.0*  --  1.7*  --   --   --   EOSABS 0.2 0.2  --  0.1  --  0.0  --   --   --   BASOSABS 0.0 0.0  --  0.0  --  0.0  --   --   --     Chemistries   Recent Labs Lab 02/12/15 0700 02/14/15 1530 02/14/15 1548 02/14/15 2213 02/15/15 0645 02/16/15 0725  NA 136 136 134* 140 138 137  K 4.9 4.9 5.1 4.2 4.3 5.0  CL 98* 99* 98* 105 107 109  CO2 30 26  --  25 22 23   GLUCOSE 140* 120* 116* 117* 159* 224*  BUN 20 59* 66* 43* 34* 50*  CREATININE 0.71 2.03* 1.90* 1.31* 0.92 0.92  CALCIUM 9.0 8.0*  --  7.4* 7.4* 7.4*  MG  --   --   --  1.7  --   --   AST  --  21  --  22 17  --   ALT  --  21  --  19 17  --   ALKPHOS  --  70  --  78 65  --   BILITOT  --  0.3  --  0.7 0.5  --    ------------------------------------------------------------------------------------------------------------------ estimated creatinine clearance is 65.1 mL/min (by C-G formula based on Cr of 0.92). ------------------------------------------------------------------------------------------------------------------  Recent Labs  02/14/15 2213  HGBA1C 7.1*   ------------------------------------------------------------------------------------------------------------------ No results for input(s): CHOL, HDL, LDLCALC, TRIG, CHOLHDL, LDLDIRECT in the last 72 hours. ------------------------------------------------------------------------------------------------------------------  Recent Labs  02/14/15 2213  TSH 1.034   ------------------------------------------------------------------------------------------------------------------ No results for input(s): VITAMINB12, FOLATE, FERRITIN, TIBC, IRON, RETICCTPCT in the last 72 hours.  Coagulation profile  Recent Labs Lab 02/14/15 2213  INR 1.42    No results for input(s): DDIMER in the last 72 hours.  Cardiac Enzymes  Recent Labs Lab 02/14/15 2213 02/15/15 0645   TROPONINI <0.03  <0.03 <0.03   ------------------------------------------------------------------------------------------------------------------ Invalid input(s): POCBNP   CBG:  Recent Labs Lab 02/15/15 0042 02/15/15 0809 02/15/15 1222 02/15/15 1619 02/15/15 2147  GLUCAP 94 107* 110* 160* 189*       Studies: Ct Abdomen Pelvis Wo Contrast  02/16/2015  CLINICAL DATA:  Anemia, leukocytosis and tarry stools. Recent admission for sepsis. History of COPD, diabetes and hypertension. History of bladder cancer and plasma cell disorder. EXAM: CT CHEST, ABDOMEN AND PELVIS WITHOUT CONTRAST TECHNIQUE: Multidetector CT imaging of the chest, abdomen and pelvis was performed following the standard protocol without IV contrast. COMPARISON:  Chest CT 12/15/2007, abdominal pelvic CT 06/30/2014 and chest radiographs 02/10/2015. Skeletal survey 05/24/2014. FINDINGS: CT CHEST Mediastinum/Nodes: There are no enlarged mediastinal, hilar or axillary lymph nodes. Small mediastinal lymph nodes are stable. The thyroid gland, trachea and esophagus demonstrate no significant findings. The heart size is normal. There is no pericardial effusion. There is extensive atherosclerosis of the aorta, great vessels and coronary arteries. Lungs/Pleura: There is minimal dependent pleural fluid bilaterally. As demonstrated on recent radiographs, there is airspace disease with consolidation and air bronchograms in the left lower lobe. There is minimal dependent atelectasis at the right lung base. A 6 mm left upper lobe nodule on image number 16 is stable. There are no new or enlarging pulmonary nodules. Mild emphysematous changes are present. Musculoskeletal/Chest wall: There is a T9 compression fracture which appears new and likely pathologic. There are multiple irregular lucent and sclerotic lesions throughout the thoracic spine and ribs. Small purely  lytic lesion noted anteriorly in the left third rib (image 21). CT ABDOMEN AND  PELVIS FINDINGS Hepatobiliary: As evaluated in the noncontrast state, the liver appears stable without suspicious findings. Small calcified gallstones are again noted. There is no gallbladder wall thickening or significant biliary dilatation. Pancreas: Unremarkable. No pancreatic ductal dilatation or surrounding inflammatory changes. Spleen: Normal in size without focal abnormality. Adrenals/Urinary Tract: Chronic right adrenal mass appears unchanged, measuring 3.4 x 3.1 cm on image 51. This contains calcifications, soft tissue and fatty components. 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 The left adrenal gland appears normal. Multiple bilateral renal cysts are again noted, measuring up to 6.0 cm in the lower pole of the right kidney and up to 5.8 cm in the interpolar region of the left kidney. Both kidneys demonstrate mild cortical thinning. There is no evidence of urinary tract calculus or hydronephrosis. The bladder appears normal. Stomach/Bowel: There appears to be mild soft tissue stranding around the distal stomach, proximal duodenum and pancreatic head. This is suboptimally evaluated due to the lack of intravenous and oral contrast. There is no extraluminal fluid collection. No evidence of bowel wall thickening, distention or surrounding inflammatory change. Stable diverticular changes of the sigmoid colon. Vascular/Lymphatic: There are no enlarged abdominal or pelvic lymph nodes. Stable extensive atherosclerosis of the aorta, its branches and the iliac arteries. There is stable dilatation of the right common iliac artery. Reproductive: Unremarkable. Other: Stable asymmetric fat in the left inguinal canal and stable small umbilical hernia containing only fat. No evidence of ascites. Musculoskeletal: There are several new mixed lytic and sclerotic lesions within the lumbar spine. There is a dominant 2.2 cm sclerotic lesion in the S1 segment. Postsurgical changes are  noted in the lower lumbar spine. There is no evidence of lumbar pathologic fracture or epidural tumor. IMPRESSION: 1. Possible inflammation involving the distal stomach and proximal duodenum in this patient with tarry stools. This is suboptimally evaluated by this examination. Consider endoscopic correlation. 2. The remainder of the small bowel and colon demonstrate no significant findings. There is distal colonic diverticulosis. 3. Left lower lobe pneumonia.  Small bilateral pleural effusions. 4. Multiple lytic and sclerotic lesions in the spine, ribs and pelvis, suspicious for metastatic disease or multiple myeloma. Correlate clinically. Probable pathologic fracture at T9. 5. Stable additional incidental findings including extensive atherosclerosis, cholelithiasis and a chronic right adrenal mass. Electronically Signed   By: Richardean Sale M.D.   On: 02/16/2015 09:45   Ct Head Wo Contrast  02/14/2015  CLINICAL DATA:  Syncope and headache. EXAM: CT HEAD WITHOUT CONTRAST TECHNIQUE: Contiguous axial images were obtained from the base of the skull through the vertex without intravenous contrast. 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 Stable cortical atrophy. The brain demonstrates no evidence of hemorrhage, acute infarction, edema, mass effect, extra-axial fluid collection, hydrocephalus or mass lesion. The skull is unremarkable. IMPRESSION: No acute findings. 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 Electronically Signed   By: Aletta Edouard M.D.   On: 02/14/2015 17:51   Ct Chest Wo Contrast  02/16/2015  CLINICAL DATA:  Anemia, leukocytosis and tarry stools. Recent admission for sepsis. History of COPD, diabetes and hypertension. History of bladder cancer and plasma cell disorder. EXAM: CT CHEST, ABDOMEN AND PELVIS WITHOUT CONTRAST TECHNIQUE: Multidetector CT imaging of the chest, abdomen and  pelvis was performed following the standard  protocol without IV contrast. COMPARISON:  Chest CT 12/15/2007, abdominal pelvic CT 06/30/2014 and chest radiographs 02/10/2015. Skeletal survey 05/24/2014. FINDINGS: CT CHEST Mediastinum/Nodes: There are no enlarged mediastinal, hilar or axillary lymph nodes. Small mediastinal lymph nodes are stable. The thyroid gland, trachea and esophagus demonstrate no significant findings. The heart size is normal. There is no pericardial effusion. There is extensive atherosclerosis of the aorta, great vessels and coronary arteries. Lungs/Pleura: There is minimal dependent pleural fluid bilaterally. As demonstrated on recent radiographs, there is airspace disease with consolidation and air bronchograms in the left lower lobe. There is minimal dependent atelectasis at the right lung base. A 6 mm left upper lobe nodule on image number 16 is stable. There are no new or enlarging pulmonary nodules. Mild emphysematous changes are present. Musculoskeletal/Chest wall: There is a T9 compression fracture which appears new and likely pathologic. There are multiple irregular lucent and sclerotic lesions throughout the thoracic spine and ribs. Small purely lytic lesion noted anteriorly in the left third rib (image 21). CT ABDOMEN AND PELVIS FINDINGS Hepatobiliary: As evaluated in the noncontrast state, the liver appears stable without suspicious findings. Small calcified gallstones are again noted. There is no gallbladder wall thickening or significant biliary dilatation. Pancreas: Unremarkable. No pancreatic ductal dilatation or surrounding inflammatory changes. Spleen: Normal in size without focal abnormality. Adrenals/Urinary Tract: Chronic right adrenal mass appears unchanged, measuring 3.4 x 3.1 cm on image 51. This contains calcifications, soft tissue and fatty components. 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 The left adrenal gland appears  normal. Multiple bilateral renal cysts are again noted, measuring up to 6.0 cm in the lower pole of the right kidney and up to 5.8 cm in the interpolar region of the left kidney. Both kidneys demonstrate mild cortical thinning. There is no evidence of urinary tract calculus or hydronephrosis. The bladder appears normal. Stomach/Bowel: There appears to be mild soft tissue stranding around the distal stomach, proximal duodenum and pancreatic head. This is suboptimally evaluated due to the lack of intravenous and oral contrast. There is no extraluminal fluid collection. No evidence of bowel wall thickening, distention or surrounding inflammatory change. Stable diverticular changes of the sigmoid colon. Vascular/Lymphatic: There are no enlarged abdominal or pelvic lymph nodes. Stable extensive atherosclerosis of the aorta, its branches and the iliac arteries. There is stable dilatation of the right common iliac artery. Reproductive: Unremarkable. Other: Stable asymmetric fat in the left inguinal canal and stable small umbilical hernia containing only fat. No evidence of ascites. Musculoskeletal: There are several new mixed lytic and sclerotic lesions within the lumbar spine. There is a dominant 2.2 cm sclerotic lesion in the S1 segment. Postsurgical changes are noted in the lower lumbar spine. There is no evidence of lumbar pathologic fracture or epidural tumor. IMPRESSION: 1. Possible inflammation involving the distal stomach and proximal duodenum in this patient with tarry stools. This is suboptimally evaluated by this examination. Consider endoscopic correlation. 2. The remainder of the small bowel and colon demonstrate no significant findings. There is distal colonic diverticulosis. 3. Left lower lobe pneumonia.  Small bilateral pleural effusions. 4. Multiple lytic and sclerotic lesions in the spine, ribs and pelvis, suspicious for metastatic disease or multiple myeloma. Correlate clinically. Probable pathologic  fracture at T9. 5. Stable additional incidental findings including extensive atherosclerosis, cholelithiasis and a chronic right adrenal mass. Electronically Signed   By: Richardean Sale M.D.   On: 02/16/2015 09:45   Dg Chest [facility]  02/14/2015  CLINICAL DATA:  Pneumonia. EXAM: PORTABLE CHEST 1 VIEW COMPARISON:  02/10/2015 FINDINGS: Left lower lobe infiltrate again identified. This may be slightly smaller and less dense compared to the prior chest x-ray. No edema or pleural fluid. The heart size and mediastinal contours are normal. IMPRESSION: Slightly smaller and less dense appearing left lower lobe pneumonia. Electronically Signed   By: Aletta Edouard M.D.   On: 02/14/2015 15:18      Lab Results  Component Value Date   HGBA1C 7.1* 02/14/2015   HGBA1C 7.0* 02/10/2015   HGBA1C 6.1* 09/29/2014   Lab Results  Component Value Date   CREATININE 0.92 02/16/2015       Scheduled Meds: . atorvastatin  10 mg Oral Daily  . ceFEPime (MAXIPIME) IV  2 g Intravenous Q12H  . Chlorhexidine Gluconate Cloth  6 each Topical Q0600  . diltiazem  10 mg Intravenous Once  . diltiazem  30 mg Oral 4 times per day  . DULoxetine  30 mg Oral QPM  . feeding supplement (ENSURE ENLIVE)  237 mL Oral BID BM  . ferrous sulfate  325 mg Oral BID WC  . fluticasone  2 spray Each Nare Daily  . gabapentin  100 mg Oral QHS  . insulin aspart  0-9 Units Subcutaneous TID WC  . levothyroxine  50 mcg Oral QAC breakfast  . metronidazole  500 mg Intravenous Q8H  . mupirocin ointment  1 application Nasal BID  . pantoprazole (PROTONIX) IV  40 mg Intravenous Q12H  . polyethylene glycol  17 g Oral Daily  . vancomycin  1,000 mg Intravenous Q12H   Continuous Infusions: . sodium chloride 125 mL/hr at 02/16/15 0100    Principal Problem:   Sepsis ([place]) Active Problems:   Type 2 diabetes mellitus ([place])   Hypothyroidism   Hypertension   Hyperlipidemia   History of depression   COPD with acute exacerbation (HCC)   Low  back pain radiating to left leg    Time spent: 45 minutes   [facility] Pager [phone]. If 7PM-7AM, please contact night-coverage at [email or web address], password [facility] 02/16/2015, 10:41 AM  LOS: 2 days

## 2015-02-16 NOTE — Progress Notes (Signed)
PO Diltazem held, BPs 99/44 HR 98

## 2015-02-16 NOTE — Consult Note (Signed)
Reason for Referral: Sclerotic lesions of the bone.   HPI: 82 year old gentleman currently of [place] where he lives independently. He has history of hypertension and hypothyroidism as well as osteoarthritis of multiple joints. He started developing a left acute pain and weakness and was hospitalized in February 2016. At that time MRI of the lumbar spine on 03/02/2014 showed a 10 mm enhancing lesion in the posterior body of S1 that is suspicious for malignancy. Bone scan obtained in January 2016 showed no evidence of any blastic lesions to suggest metastatic malignancy. He did have a DEXA scan which showed osteoporosis at that time. Serum protein electrophoresis detected an M spike of 0.3 g/dL.  Based on these findings he was referred to me for an evaluation back in April 2016. By evaluation included repeat a serum protein electrophoresis which showed no monoclonal protein or M spike. It did show a faint area of mobility around the IgG kappa area that warrants further evaluation. His quantitative immunoglobulins including IgG, IgA and IgM are within normal range. His light chains all within normal range with a normal ratio. Skeletal survey obtained on April 2016 did not show any evidence of any lytic lesions at that time.  Patient has been hospitalized on 2 occasions in January 2016. Initially he was diagnosed with pneumonia and was discharged on 02/12/2015. He was hospitalized again on 02/14/2015 after he was found to have weakness, fatigue and near syncope. At that time he was noted to have a hemoglobin of 8.4 which is a drop from 10.4 on 02/12/2015. Differential was essentially unremarkable. Chemistries obtained at that time showed mild increase his creatinine that have corrected the last 2 days. His albumin is low at 2.1 and total protein is slightly depressed. Calcium is low at 7.4 but normal liver function tests and order electrolytes.   CT scan chest abdomen and pelvis obtained on 02/16/2015  showed possible inflammation around the distal stomach that warrant evaluation. Distal colonic diverticulosis was also noted. Lytic and sclerotic lesions were noted in the ribs spine and pelvis suspicious for metastatic disease versus plasma cell disorder.  Clinically, he appeared to be weak and fatigued but responsive. He is able to answer questions appropriately. He does not report any headaches, blurry vision or seizures. Does report potentially passing out. Does not report any chest pain, palpitation orthopnea. Does not report any cough or wheezing. Does not report any nausea, abdominal pain. Does not report any skeletal complaints. Remaining review of systems unremarkable.   Past Medical History  Diagnosis Date  . Hypertension   . Hyperlipidemia   . Hypothyroidism   . COPD (chronic obstructive pulmonary disease) ([place])   . Atrial fibrillation ([place])   . Glaucoma   . Anxiety   . History of bladder cancer   . Lung nodule     SMALL  . OSA (obstructive sleep apnea)   . Heart murmur   . Dysrhythmia     Hx of Atrial Flutter  . Deep vein blood clot of left lower extremity ([place])   . On home oxygen therapy     At night, during the day PRN 2.5L  . Cancer ([place])     bladder cancer, skin cancer  . Diabetes mellitus without complication (H[facility])     Type 2  . Depression   . Arthritis   . Osteoporosis   . Pneumonia 02/2015  . Kidney stones 2016  :  Past Surgical History  Procedure Laterality Date  . Cardiovascular stress test  12/25/2007  EF 64%. NO EVIDENCE OF ISCHEMIA  . Cystoscopy N/A 12/15  . Varicose vein surgery      at 82 years old first, and about 20 years ago  . Back surgery  10-18-11    [facility]  . Tonsillectomy    . Eye surgery Bilateral     cataracts  . Multiple tooth extractions    . Total knee arthroplasty Left 10/11/2014    Procedure: TOTAL KNEE ARTHROPLASTY;  Surgeon: Renette Butters, MD;  Location: [facility];  Service: Orthopedics;  Laterality: Left;  Marland Kitchen  Vascular surgery    :   Current facility-administered medications:  .  0.9 %  sodium chloride infusion, , Intravenous, Continuous, Reyne Dumas, MD, Last Rate: 125 mL/hr at 02/16/15 0100 .  acetaminophen (TYLENOL) tablet 650 mg, 650 mg, Oral, Q6H PRN **OR** acetaminophen (TYLENOL) suppository 650 mg, 650 mg, Rectal, Q6H PRN, Reyne Dumas, MD .  albuterol (PROVENTIL) (2.5 MG/3ML) 0.083% nebulizer solution 2.5 mg, 2.5 mg, Nebulization, Q6H PRN, Reyne Dumas, MD .  ALPRAZolam Duanne Moron) tablet 0.25 mg, 0.25 mg, Oral, QHS PRN, Reyne Dumas, MD, 0.25 mg at 02/15/15 2130 .  atorvastatin (LIPITOR) tablet 10 mg, 10 mg, Oral, Daily, Reyne Dumas, MD, 10 mg at 02/16/15 0936 .  ceFEPIme (MAXIPIME) 2 g in dextrose 5 % 50 mL IVPB, 2 g, Intravenous, Q12H, Reginia Naas, RPH, Last Rate: 100 mL/hr at 02/16/15 0428, 2 g at 02/16/15 0428 .  Chlorhexidine Gluconate Cloth 2 % PADS 6 each, 6 each, Topical, Q0600, Reyne Dumas, MD, 6 each at 02/16/15 0800 .  diltiazem (CARDIZEM) injection 10 mg, 10 mg, Intravenous, Once, Reyne Dumas, MD, 10 mg at 02/15/15 2145 .  diltiazem (CARDIZEM) tablet 30 mg, 30 mg, Oral, 4 times per day, Reyne Dumas, MD, Stopped at 02/16/15 0800 .  DULoxetine (CYMBALTA) DR capsule 30 mg, 30 mg, Oral, QPM, Reyne Dumas, MD, 30 mg at 02/15/15 1812 .  feeding supplement (ENSURE ENLIVE) (ENSURE ENLIVE) liquid 237 mL, 237 mL, Oral, BID BM, Reyne Dumas, MD, 237 mL at 02/16/15 1000 .  ferrous sulfate tablet 325 mg, 325 mg, Oral, BID WC, Reyne Dumas, MD, 325 mg at 02/16/15 0935 .  fluticasone (FLONASE) 50 MCG/ACT nasal spray 2 spray, 2 spray, Each Nare, Daily, Reyne Dumas, MD, 2 spray at 02/16/15 1000 .  gabapentin (NEURONTIN) capsule 100 mg, 100 mg, Oral, QHS, Reyne Dumas, MD, 100 mg at 02/15/15 2130 .  insulin aspart (novoLOG) injection 0-9 Units, 0-9 Units, Subcutaneous, TID WC, Reyne Dumas, MD, 0 Units at 02/15/15 0834 .  levalbuterol (XOPENEX) nebulizer solution 0.63 mg, 0.63 mg,  Nebulization, Q6H PRN, Reyne Dumas, MD .  levothyroxine (SYNTHROID, LEVOTHROID) tablet 50 mcg, 50 mcg, Oral, QAC breakfast, Reyne Dumas, MD, 50 mcg at 02/16/15 0935 .  metroNIDAZOLE (FLAGYL) IVPB 500 mg, 500 mg, Intravenous, Q8H, Reyne Dumas, MD, 500 mg at 02/16/15 0936 .  mupirocin ointment (BACTROBAN) 2 % 1 application, 1 application, Nasal, BID, Reyne Dumas, MD, 1 application at 38/18/29 1000 .  ondansetron (ZOFRAN) tablet 4 mg, 4 mg, Oral, Q6H PRN **OR** ondansetron (ZOFRAN) injection 4 mg, 4 mg, Intravenous, Q6H PRN, Reyne Dumas, MD, 4 mg at 02/15/15 2137 .  oxyCODONE-acetaminophen (PERCOCET/ROXICET) 5-325 MG per tablet 1 tablet, 1 tablet, Oral, Q8H PRN, Reyne Dumas, MD, 1 tablet at 02/15/15 2130 .  pantoprazole (PROTONIX) 80 mg in sodium chloride 0.9 % 250 mL (0.32 mg/mL) infusion, 8 mg/hr, Intravenous, Continuous, Reyne Dumas, MD .  Derrill Memo ON 02/19/2015] pantoprazole (PROTONIX) injection  40 mg, 40 mg, Intravenous, Q12H, Nayana Abrol, MD .  polyethylene glycol (MIRALAX / GLYCOLAX) packet 17 g, 17 g, Oral, Daily, Reyne Dumas, MD, 17 g at 02/15/15 1222 .  vancomycin (VANCOCIN) IVPB 1000 mg/200 mL premix, 1,000 mg, Intravenous, Q12H, Reginia Naas, RPH, Last Rate: 200 mL/hr at 02/16/15 0459, 1,000 mg at 02/16/15 0459:  Allergies  Allergen Reactions  . Amoxicillin Palpitations  . Motrin [Ibuprofen] Palpitations  . Chantix [Varenicline Tartrate] Other (See Comments)    depression  :  Family History  Problem Relation Age of Onset  . Cancer - Lung Mother     31  . Heart attack Mother   . Heart Problems Father   . Diabetes Brother   . Alcoholism Brother   . Other Daughter     [place]  . Other Other     HALF SISTER [facility]  :  Social History   Social History  . Marital Status: Divorced    Spouse Name: N/A  . Number of Children: 1  . Years of Education: N/A   Occupational History  . Retired     worked at a [employer]   Social History Main  Topics  . Smoking status: Former Smoker -- 1.50 packs/day for 60 years    Types: Cigarettes    Quit date: 11/27/2010  . Smokeless tobacco: Never Used  . Alcohol Use: No  . Drug Use: No  . Sexual Activity: No   Other Topics Concern  . Not on file   Social History Narrative  :  Pertinent items are noted in HPI.  Exam: ECOG 2 Blood pressure 105/48, pulse 38, temperature 99 F (37.2 C), temperature source Axillary, resp. rate 16, height 5' 7"  (1.702 m), weight 206 lb (93.441 kg), SpO2 100 %. General appearance: A somnolent gentleman without distress. Appears chronically ill. Head: Normocephalic, without obvious abnormality no scleral icterus. Throat: lips, mucosa, and tongue normal; teeth and gums normal no oral ulcers or lesions. Neck: no adenopathy Back: negative, symmetric, no curvature. ROM normal. No CVA tenderness. Resp: clear to auscultation bilaterally without wheezing. Chest wall: no tenderness Cardio: regular rate and rhythm, S1, S2 normal, no murmur, click, rub or gallop slightly tachycardic. GI: soft, non-tender; bowel sounds normal; no masses,  no organomegaly no shifting dullness or ascites. Extremities: extremities normal, atraumatic, no cyanosis or edema Pulses: 2+ and symmetric Skin: Appeared pale.    Recent Labs  02/15/15 2339 02/16/15 0725 02/16/15 1010  WBC 24.5*  --  25.0*  HGB 6.7* 7.2* 6.6*  HCT 21.5* 22.9* 20.3*  PLT 237  --  198    Recent Labs  02/15/15 0645 02/16/15 0725  NA 138 137  K 4.3 5.0  CL 107 109  CO2 22 23  GLUCOSE 159* 224*  BUN 34* 50*  CREATININE 0.92 0.92  CALCIUM 7.4* 7.4*      Ct Abdomen Pelvis Wo Contrast  02/16/2015  CLINICAL DATA:  Anemia, leukocytosis and tarry stools. Recent admission for sepsis. History of COPD, diabetes and hypertension. History of bladder cancer and plasma cell disorder. EXAM: CT CHEST, ABDOMEN AND PELVIS WITHOUT CONTRAST TECHNIQUE: Multidetector CT imaging of the chest, abdomen and pelvis  was performed following the standard protocol without IV contrast. COMPARISON:  Chest CT 12/15/2007, abdominal pelvic CT 06/30/2014 and chest radiographs 02/10/2015. Skeletal survey 05/24/2014. FINDINGS: CT CHEST Mediastinum/Nodes: There are no enlarged mediastinal, hilar or axillary lymph nodes. Small mediastinal lymph nodes are stable. The thyroid gland, trachea and esophagus  demonstrate no significant findings. The heart size is normal. There is no pericardial effusion. There is extensive atherosclerosis of the aorta, great vessels and coronary arteries. Lungs/Pleura: There is minimal dependent pleural fluid bilaterally. As demonstrated on recent radiographs, there is airspace disease with consolidation and air bronchograms in the left lower lobe. There is minimal dependent atelectasis at the right lung base. A 6 mm left upper lobe nodule on image number 16 is stable. There are no new or enlarging pulmonary nodules. Mild emphysematous changes are present. Musculoskeletal/Chest wall: There is a T9 compression fracture which appears new and likely pathologic. There are multiple irregular lucent and sclerotic lesions throughout the thoracic spine and ribs. Small purely lytic lesion noted anteriorly in the left third rib (image 21). CT ABDOMEN AND PELVIS FINDINGS Hepatobiliary: As evaluated in the noncontrast state, the liver appears stable without suspicious findings. Small calcified gallstones are again noted. There is no gallbladder wall thickening or significant biliary dilatation. Pancreas: Unremarkable. No pancreatic ductal dilatation or surrounding inflammatory changes. Spleen: Normal in size without focal abnormality. Adrenals/Urinary Tract: Chronic right adrenal mass appears unchanged, measuring 3.4 x 3.1 cm on image 51. This contains calcifications, soft tissue and fatty components. 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 The left adrenal gland appears normal.  Multiple bilateral renal cysts are again noted, measuring up to 6.0 cm in the lower pole of the right kidney and up to 5.8 cm in the interpolar region of the left kidney. Both kidneys demonstrate mild cortical thinning. There is no evidence of urinary tract calculus or hydronephrosis. The bladder appears normal. Stomach/Bowel: There appears to be mild soft tissue stranding around the distal stomach, proximal duodenum and pancreatic head. This is suboptimally evaluated due to the lack of intravenous and oral contrast. There is no extraluminal fluid collection. No evidence of bowel wall thickening, distention or surrounding inflammatory change. Stable diverticular changes of the sigmoid colon. Vascular/Lymphatic: There are no enlarged abdominal or pelvic lymph nodes. Stable extensive atherosclerosis of the aorta, its branches and the iliac arteries. There is stable dilatation of the right common iliac artery. Reproductive: Unremarkable. Other: Stable asymmetric fat in the left inguinal canal and stable small umbilical hernia containing only fat. No evidence of ascites. Musculoskeletal: There are several new mixed lytic and sclerotic lesions within the lumbar spine. There is a dominant 2.2 cm sclerotic lesion in the S1 segment. Postsurgical changes are noted in the lower lumbar spine. There is no evidence of lumbar pathologic fracture or epidural tumor. IMPRESSION: 1. Possible inflammation involving the distal stomach and proximal duodenum in this patient with tarry stools. This is suboptimally evaluated by this examination. Consider endoscopic correlation. 2. The remainder of the small bowel and colon demonstrate no significant findings. There is distal colonic diverticulosis. 3. Left lower lobe pneumonia.  Small bilateral pleural effusions. 4. Multiple lytic and sclerotic lesions in the spine, ribs and pelvis, suspicious for metastatic disease or multiple myeloma. Correlate clinically. Probable pathologic fracture at  T9. 5. Stable additional incidental findings including extensive atherosclerosis, cholelithiasis and a chronic right adrenal mass. Electronically Signed   By: Richardean Sale M.D.   On: 02/16/2015 09:45   Dg Chest 2 View  02/10/2015  CLINICAL DATA:  Left-sided chest pain in the mornings for the last few days. EXAM: CHEST  2 VIEW COMPARISON:  04/12/2011 FINDINGS: Heart size is normal. There is atherosclerosis of the aorta. The right lung is clear. There is abnormal density in the left  lower lobe posteriorly most consistent with bronchopneumonia. Follow-up to clearing recommended to ensure there is not a mass lesion. No effusion. No acute bone finding. IMPRESSION: Left lower lobe infiltrate most consistent with bronchopneumonia. Follow-up to clearing to rule out underlying mass. Electronically Signed   By: Nelson Chimes M.D.   On: 02/10/2015 15:12   Ct Head Wo Contrast  02/14/2015  CLINICAL DATA:  Syncope and headache. EXAM: CT HEAD WITHOUT CONTRAST TECHNIQUE: Contiguous axial images were obtained from the base of the skull through the vertex without intravenous contrast. 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 Stable cortical atrophy. The brain demonstrates no evidence of hemorrhage, acute infarction, edema, mass effect, extra-axial fluid collection, hydrocephalus or mass lesion. The skull is unremarkable. IMPRESSION: No acute findings. 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 Electronically Signed   By: Aletta Edouard M.D.   On: 02/14/2015 17:51   Ct Chest Wo Contrast  02/16/2015  CLINICAL DATA:  Anemia, leukocytosis and tarry stools. Recent admission for sepsis. History of COPD, diabetes and hypertension. History of bladder cancer and plasma cell disorder. EXAM: CT CHEST, ABDOMEN AND PELVIS WITHOUT CONTRAST TECHNIQUE: Multidetector CT imaging of the chest, abdomen and pelvis was performed  following the standard protocol without IV contrast. COMPARISON:  Chest CT 12/15/2007, abdominal pelvic CT 06/30/2014 and chest radiographs 02/10/2015. Skeletal survey 05/24/2014. FINDINGS: CT CHEST Mediastinum/Nodes: There are no enlarged mediastinal, hilar or axillary lymph nodes. Small mediastinal lymph nodes are stable. The thyroid gland, trachea and esophagus demonstrate no significant findings. The heart size is normal. There is no pericardial effusion. There is extensive atherosclerosis of the aorta, great vessels and coronary arteries. Lungs/Pleura: There is minimal dependent pleural fluid bilaterally. As demonstrated on recent radiographs, there is airspace disease with consolidation and air bronchograms in the left lower lobe. There is minimal dependent atelectasis at the right lung base. A 6 mm left upper lobe nodule on image number 16 is stable. There are no new or enlarging pulmonary nodules. Mild emphysematous changes are present. Musculoskeletal/Chest wall: There is a T9 compression fracture which appears new and likely pathologic. There are multiple irregular lucent and sclerotic lesions throughout the thoracic spine and ribs. Small purely lytic lesion noted anteriorly in the left third rib (image 21). CT ABDOMEN AND PELVIS FINDINGS Hepatobiliary: As evaluated in the noncontrast state, the liver appears stable without suspicious findings. Small calcified gallstones are again noted. There is no gallbladder wall thickening or significant biliary dilatation. Pancreas: Unremarkable. No pancreatic ductal dilatation or surrounding inflammatory changes. Spleen: Normal in size without focal abnormality. Adrenals/Urinary Tract: Chronic right adrenal mass appears unchanged, measuring 3.4 x 3.1 cm on image 51. This contains calcifications, soft tissue and fatty components. 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 The left adrenal gland appears normal. Multiple bilateral  renal cysts are again noted, measuring up to 6.0 cm in the lower pole of the right kidney and up to 5.8 cm in the interpolar region of the left kidney. Both kidneys demonstrate mild cortical thinning. There is no evidence of urinary tract calculus or hydronephrosis. The bladder appears normal. Stomach/Bowel: There appears to be mild soft tissue stranding around the distal stomach, proximal duodenum and pancreatic head. This is suboptimally evaluated due to the lack of intravenous and oral contrast. There is no extraluminal fluid collection. No evidence of bowel wall thickening, distention or surrounding  inflammatory change. Stable diverticular changes of the sigmoid colon. Vascular/Lymphatic: There are no enlarged abdominal or pelvic lymph nodes. Stable extensive atherosclerosis of the aorta, its branches and the iliac arteries. There is stable dilatation of the right common iliac artery. Reproductive: Unremarkable. Other: Stable asymmetric fat in the left inguinal canal and stable small umbilical hernia containing only fat. No evidence of ascites. Musculoskeletal: There are several new mixed lytic and sclerotic lesions within the lumbar spine. There is a dominant 2.2 cm sclerotic lesion in the S1 segment. Postsurgical changes are noted in the lower lumbar spine. There is no evidence of lumbar pathologic fracture or epidural tumor. IMPRESSION: 1. Possible inflammation involving the distal stomach and proximal duodenum in this patient with tarry stools. This is suboptimally evaluated by this examination. Consider endoscopic correlation. 2. The remainder of the small bowel and colon demonstrate no significant findings. There is distal colonic diverticulosis. 3. Left lower lobe pneumonia.  Small bilateral pleural effusions. 4. Multiple lytic and sclerotic lesions in the spine, ribs and pelvis, suspicious for metastatic disease or multiple myeloma. Correlate clinically. Probable pathologic fracture at T9. 5. Stable  additional incidental findings including extensive atherosclerosis, cholelithiasis and a chronic right adrenal mass. Electronically Signed   By: Richardean Sale M.D.   On: 02/16/2015 09:45   Dg Chest Port 1 View  02/14/2015  CLINICAL DATA:  Pneumonia. EXAM: PORTABLE CHEST 1 VIEW COMPARISON:  02/10/2015 FINDINGS: Left lower lobe infiltrate again identified. This may be slightly smaller and less dense compared to the prior chest x-ray. No edema or pleural fluid. The heart size and mediastinal contours are normal. IMPRESSION: Slightly smaller and less dense appearing left lower lobe pneumonia. Electronically Signed   By: Aletta Edouard M.D.   On: 02/14/2015 15:18    Assessment and Plan:    82 year old gentleman with the following issues:  1. Mixed lytic and sclerotic lesions noted on her CT scan on 02/15/2014. Previous imaging studies have been reviewed including MRI of the spine on 03/02/2014, skeletal survey in April 2016 as well as bone scan in January 2016. It appeared that he had very small lesion with in the posterior body of S1. Other than that, all imaging studies did not show any evidence of sclerotic or lytic lesions.  His serum protein electrophoresis from April 2016 did not indicate a plasma cell disorder.  The differential diagnosis of his recent CT scan findings include metastatic solid tumor malignancy such as prostate cancer, GI malignancy, among others. Multiple myeloma would be a possibility but less likely given his workup in the past did not reveal any clear-cut abnormalities.  From a management standpoint, I agree with repeating serum protein electrophoresis I would also obtain a PSA for completeness.  I agree with the GI workup and possible endoscopy and a biopsy of his distal stomach and duodenum given the possibility we are dealing with GI malignancy or possible a bleeding ulcer.  If his endoscopy did not reveal the diagnosis, he might require a biopsy of a bone lesion at  S1 segment that is measuring 2.2 cm.  2. Anemia: Likely due to GI bleeding and I agree with stopping his anticoagulation and transfusion as you're doing.  3. Leukocytosis: Appears to be reactive and possible recent sepsis episode.   Further recommendations will be given depending on the results of his PSA, SPEP and endoscopy.

## 2015-02-16 NOTE — Progress Notes (Signed)
CRITICAL VALUE ALERT  Critical value received:  Hgb 6.7  Date of notification:  02/16/15  Time of notification:  0119  Critical value read back:Yes.    Nurse who received alert:  Donnella Bi  MD notified (1st page):  Baltazar Najjar  Time of first page:  0124  Responding MD:  Baltazar Najjar  Time MD responded:  [phone]  Order for PRBC's received and infusing started.

## 2015-02-16 NOTE — Progress Notes (Signed)
20 minutes into Recieving 1st unit of blood BP  85/73, temp 99. Dr. Allyson Sabal paged. Pt asymptomatic, had one medium sized loose tarry stool.

## 2015-02-16 NOTE — Care Management Note (Addendum)
Case Management Note  Patient Details  Name: Taylor [name] MRN: [id number] Date of Birth: Jun 15, 1928  Subjective/Objective:  Pt admitted  with a hx of paroxysmal atrial fibrillation on xarelto, COPD, on nocturnal oxygen 2.5 L, diabetes mellitus, hypertension, recently admitted 1/6-1/8 for community-acquired pneumonia. Discharged home on levofloxacin for 7 days, who presents with weakness, persistent nausea, syncopal episode at home, Patient was found on the floor by his family. On arrival systolic blood pressure was 64 /34. Per MD notes: Sepsis protocol initiated. Pt is currently active with [facility] for [place].  PT/OT to be consulted for additional needs.                Action/Plan: CM will continue to monitor.    Expected Discharge Date:                  Expected Discharge Plan:  [place]  In-House Referral:  NA  Discharge planning Services  CM Consult  Post Acute Care Choice:  Home Health, Resumption of Svcs/PTA Provider Choice offered to:  Patient  DME Arranged:  N/A DME Agency:  NA  HH Arranged:  RN, PT, OT, Nurse's Aide [place] Agency:  [place]  Status of Service:  In process, will continue to follow  Medicare Important Message Given:    Date Medicare IM Given:    Medicare IM give by:    Date Additional Medicare IM Given:    Additional Medicare Important Message give by:     If discussed at [place] of Stay Meetings, dates discussed:    Additional Comments: 1221 02-21-15 Jacqlyn Krauss, RN,BSN [phone] PT recommendations for SNF- CSW to speak with pt in regards to SNF. CM will continue to monitor.   1020 02-17-15 Jacqlyn Krauss, RN,BSN [phone] Pt is active with [facility]: PT, OT, RN, Aide- Pt will need resumption orders once stable for d/c if plan continues to be home for d/c.   Adelfa Koh Ocie Cornfield, RN 02/16/2015, 12:25 PM

## 2015-02-17 ENCOUNTER — Encounter (HOSPITAL_COMMUNITY)
Admission: EM | Disposition: A | Discharge status: Skilled Nursing Facility | Payer: Self-pay | Source: Home / Self Care | Attending: Internal Medicine

## 2015-02-17 ENCOUNTER — Encounter (HOSPITAL_COMMUNITY): Payer: Self-pay

## 2015-02-17 DIAGNOSIS — Z66 Do not resuscitate: Secondary | ICD-10-CM

## 2015-02-17 DIAGNOSIS — Z515 Encounter for palliative care: Secondary | ICD-10-CM

## 2015-02-17 DIAGNOSIS — R531 Weakness: Secondary | ICD-10-CM

## 2015-02-17 LAB — COMPREHENSIVE METABOLIC PANEL
ALK PHOS: 61 U/L (ref 38–126)
ALT: 17 U/L (ref 17–63)
ANION GAP: 8 (ref 5–15)
AST: 19 U/L (ref 15–41)
Albumin: 1.7 g/dL — ABNORMAL LOW (ref 3.5–5.0)
BILIRUBIN TOTAL: 0.5 mg/dL (ref 0.3–1.2)
BUN: 36 mg/dL — ABNORMAL HIGH (ref 6–20)
CALCIUM: 7.5 mg/dL — AB (ref 8.9–10.3)
CO2: 22 mmol/L (ref 22–32)
Chloride: 110 mmol/L (ref 101–111)
Creatinine, Ser: 0.77 mg/dL (ref 0.61–1.24)
GFR calc non Af Amer: >60 mL/min (ref >60)
Glucose, Bld: 192 mg/dL — ABNORMAL HIGH (ref 65–99)
POTASSIUM: 4.7 mmol/L (ref 3.5–5.1)
Sodium: 140 mmol/L (ref 135–145)
TOTAL PROTEIN: 4.4 g/dL — AB (ref 6.5–8.1)

## 2015-02-17 LAB — PROTEIN ELECTROPHORESIS, SERUM
A/G Ratio: 0.6 — ABNORMAL LOW (ref 0.7–1.7)
ALBUMIN ELP: 2 g/dL — AB (ref 2.9–4.4)
ALPHA-1-GLOBULIN: 0.4 g/dL (ref 0.0–0.4)
Alpha-2-Globulin: 1 g/dL (ref 0.4–1.0)
Beta Globulin: 0.8 g/dL (ref 0.7–1.3)
GAMMA GLOBULIN: 1 g/dL (ref 0.4–1.8)
Globulin, Total: 3.3 g/dL (ref 2.2–3.9)
TOTAL PROTEIN ELP: 5.3 g/dL — AB (ref 6.0–8.5)

## 2015-02-17 LAB — UIFE/LIGHT CHAINS/TP QN, 24-HR UR
% BETA, URINE: 32 %
ALBUMIN, U: 10.4 %
ALPHA 1 URINE: 4.4 %
Alpha 2, Urine: 23.8 %
FREE KAPPA/LAMBDA RATIO: 3.97 (ref 2.04–10.37)
Free Lambda Lt Chains,Ur: 130 mg/L — ABNORMAL HIGH (ref 0.24–6.66)
Free Lt Chn Excr Rate: 516 mg/L — ABNORMAL HIGH (ref 1.35–24.19)
GAMMA GLOBULIN URINE: 29.5 %
Total Protein, Urine: 15.9 mg/dL

## 2015-02-17 LAB — CBC
HEMATOCRIT: 24 % — AB (ref 39.0–52.0)
HEMOGLOBIN: 8 g/dL — AB (ref 13.0–17.0)
MCH: 29.3 pg (ref 26.0–34.0)
MCHC: 33.3 g/dL (ref 30.0–36.0)
MCV: 87.9 fL (ref 78.0–100.0)
Platelets: 169 10*3/uL (ref 150–400)
RBC: 2.73 MIL/uL — ABNORMAL LOW (ref 4.22–5.81)
RDW: 15.6 % — AB (ref 11.5–15.5)
WBC: 21.3 10*3/uL — ABNORMAL HIGH (ref 4.0–10.5)

## 2015-02-17 LAB — URINE CULTURE: CULTURE: NO GROWTH

## 2015-02-17 LAB — GLUCOSE, CAPILLARY
GLUCOSE-CAPILLARY: 194 mg/dL — AB (ref 65–99)
Glucose-Capillary: 123 mg/dL — ABNORMAL HIGH (ref 65–99)
Glucose-Capillary: 174 mg/dL — ABNORMAL HIGH (ref 65–99)
Glucose-Capillary: 166 mg/dL — ABNORMAL HIGH (ref 65–99)

## 2015-02-17 LAB — PSA: PSA: 0.02 ng/mL (ref 0.00–4.00)

## 2015-02-17 SURGERY — CANCELLED PROCEDURE

## 2015-02-17 MED ORDER — LATANOPROST 0.005 % OP SOLN
1.0000 [drp] | Freq: Every evening | OPHTHALMIC | Status: DC
Start: 2015-02-17 — End: 2015-02-24
  Administered 2015-02-17 – 2015-02-23 (×7): 1 [drp] via OPHTHALMIC
  Filled 2015-02-17: qty 2.5

## 2015-02-17 MED ORDER — CETYLPYRIDINIUM CHLORIDE 0.05 % MT LIQD
7.0000 mL | Freq: Two times a day (BID) | OROMUCOSAL | Status: DC
  Administered 2015-02-17 – 2015-02-24 (×13): 7 mL via OROMUCOSAL

## 2015-02-17 NOTE — Progress Notes (Signed)
[name] PROGRESS NOTE  Taylor [name] ZWC:[id number] DOB: May 30, 1928 DOA: 02/14/2015 PCP: [name], MD  Length of stay: 3   Assessment/Plan: Principal Problem:   Sepsis ([place]) Active Problems:   Type 2 diabetes mellitus ([place])   Hypothyroidism   Hypertension   Hyperlipidemia   History of depression   COPD with acute exacerbation ([place])   Low back pain radiating to left leg    HPI:  82 year old male with a history of paroxysmal atrial fibrillation on xarelto, COPD, on nocturnal oxygen 2.5 L, diabetes mellitus, hypertension, recently admitted 1/6-1/8 for community-acquired pneumonia, left lower lobe, discharged home on levofloxacin for 7 days, who presents with weakness, persistent nausea, syncopal episode at home, and the patient remembers hitting the back of his head. Patient was found on the floor by his family, daughter brings him in states that patient has been weak and bed bound since his discharge. Due to nausea the patient has not been eating or drinking. No diarrhea, continues to have pleuritic chest pain when he coughs. Denies any headache, blurry vision, slurred speech. Patient has a history of chronic low back pain, followed by Dr. [name], had an MRI 2 weeks ago, was found to have lesions concerning for metastatic disease and was referred to Dr. [name] for further evaluation. He has not been seen by oncology and had an upcoming appointment next week. Patient states that he has been compliant with all his medications including HCTZ, levofloxacin. On arrival systolic blood pressure was 64 /34. Sepsis protocol was initiated. White count appears to be persistently high at 17.9. Hemoglobin has dropped from 10.4>9.2. Creatinine has increased from 0.7>2.03. Lactate 2.86.    Assessment and plan Sepsis with hypovolemic shock, hemorrhagic shock Likely in the setting of recent left lower lobe pneumonia with incomplete resolution  , UA negative  White  count still elevated, lactate on admit 2.86 >1.6, creatinine 0.71> on admit 2.03>0.92  , Pro calcitonin <0.10 Compliant with Levaquin at home, given poor oral intake and nausea the patient appears severely dehydrated Blood culture from 1/6 remain negative during previous hospitalization Follow  blood culture 2, broad-spectrum antibiotics patient initiated on cefepime, Flagyl and vancomycin, day 4 Continue current antibiotic treatment Discussed with Dr. [name] who recently performed MRI of the patient's lumbar spine and there was no evidence of discitis CT chest showed left lower lobe pneumonia    Syncope likely secondary to orthostatic hypotension in the setting of pneumonia/sepsis, GI bleeding, underlying malignancy, negative  troponin, 2-D echo, doubt PE was getting xarelto prior to admission ,CT head negative    Community-acquired pneumonia with underlying history of COPD, chronic respiratory failure Continue antibiotics as above - Previous Influenza panel negative, urine strep antigen negative.  -Previous Pro calcitonin less than 0.1, lactic acid 1.0. Lactate is now 2.86 - Previous Blood cultures remain negative to date.     Hyperkalemia  -Previously K+ 6.5 , now 4.3  Placed on low-dose of hydrochlorothiazide 12.5 mg daily prior to DC on 1/8 .   Severe COPD with chronic respiratory failure - Using 2.5 Lpm supplemental O2 at night while home  - Continue duo nebs, wean O2 as tolerated  Type II DM  - Holding home metformin while inpt  - Continue sliding scale insulin, hemoglobin A1c 7.0 Anion gap 11   Fall-given that the patient is on xarelto  CT head done to  rule out intracranial bleeding   Normocytic Anemia , likely now with acute blood  loss anemia, malignancy  Hemoglobin was 10.8 on 1/6, dropped to 6.7 on 1/ 11, now 8.0 data posttransfusion of 3 units of packed red blood cells Discontinued xarelto,   CT shows possible inflammation of the distal stomach  and proximal duodenum, will consult GI CT shows multiple lytic and sclerotic lesions in the spine and ribs and pelvis suspicious for metastatic disease or multiple myeloma  Repeat  SPEP /UPEP, CT scan findings include metastatic solid tumor malignancy such as prostate cancer, GI malignancy, among others. Multiple myeloma would be a possibility but less likely given his workup in the past did not reveal any clear-cut abnormalities. EGD today by Dr. [name]   Paroxysmal atrial fib  - Currently in sinus, , discontinued  Xarelto  Initially held diltiazem. Resumed due to tachycardia, hold parameters in place - CHADS vasc 2, patient was not on any anticoagulation prior to his left knee total arthroplasty and was placed on xarelto for DVT prophylaxis.   Discharged home on Xarelto after last admission   Hypothyroidism Continue Synthroid   Adrenal mass, on MRI of the lumbar spine as per Dr. [name] Will obtain MRI of the abdomen to rule out renal mass, patient has history of bladder cancer Followed by Dr. Larinda Buttery  Urology and Dr [name]     DVT prophylaxsis Xarelto  Code Status:      Code Status Orders        Start     Ordered   02/14/15 1720  Full code   Continuous     02/14/15 1721    Code Status History    Date Active Date Inactive Code Status Order ID Comments User Context   02/10/2015  7:35 PM 02/12/2015  9:27 PM Full Code [id number]  Vianne Bulls, MD ED   10/11/2014  2:58 PM 10/13/2014  6:39 PM Full Code [id number]  Lovett Calender, PA-C Inpatient   03/02/2014 11:23 PM 03/07/2014  7:29 PM Full Code [id number]  Leanna Battles, MD ED      Family Communication: Discussed in detail with the patient, all imaging results, lab results explained to the patient   Disposition Plan:  Continue stepdown, updates daughter [phone], [facility], consult oncology, palliative , GI     Consultants:  None  Procedures:  None  Antibiotics: Anti-infectives    Start      Dose/Rate Route Frequency Ordered Stop   02/16/15 0900  metroNIDAZOLE (FLAGYL) IVPB 500 mg     500 mg 100 mL/hr over 60 Minutes Intravenous Every 8 hours 02/16/15 0831     02/15/15 0400  vancomycin (VANCOCIN) IVPB 1000 mg/200 mL premix     1,000 mg 200 mL/hr over 60 Minutes Intravenous Every 12 hours 02/14/15 1457     02/15/15 0330  ceFEPIme (MAXIPIME) 2 g in dextrose 5 % 50 mL IVPB     2 g 100 mL/hr over 30 Minutes Intravenous Every 12 hours 02/14/15 1457     02/14/15 1530  vancomycin (VANCOCIN) 1,750 mg in sodium chloride 0.9 % 500 mL IVPB     1,750 mg 250 mL/hr over 120 Minutes Intravenous  Once 02/14/15 1449 02/14/15 1917   02/14/15 1500  ceFEPIme (MAXIPIME) 2 g in dextrose 5 % 50 mL IVPB     2 g 100 mL/hr over 30 Minutes Intravenous  Once 02/14/15 1446 02/14/15 1613   02/14/15 1500  vancomycin (VANCOCIN) IVPB 1000 mg/200 mL premix  Status:  Discontinued     1,000 mg 200 mL/hr over 60 Minutes Intravenous  Once 02/14/15 1446 02/14/15 1449         HPI/Subjective: Tachycardia improved, afebrile, no further episodes of GI bleeding overnight   Objective: Filed Vitals:   02/17/15 0406 02/17/15 0408 02/17/15 0608 02/17/15 0808  BP:  109/49 100/45   Pulse:  84 78   Temp: 98.1 F (36.7 C)   98.2 F (36.8 C)  TempSrc: Oral   Oral  Resp:  17 16   Height:      Weight: 96.435 kg (212 lb 9.6 oz)     SpO2:  99% 99%     Intake/Output Summary (Last 24 hours) at 02/17/15 1046 Last data filed at 02/17/15 9528  Gross per 24 hour  Intake   2305 ml  Output    950 ml  Net   1355 ml    Exam:  General: No acute respiratory distress Lungs: Clear to auscultation bilaterally without wheezes or crackles Cardiovascular: Regular rate and rhythm without murmur gallop or rub normal S1 and S2 Abdomen: Nontender, nondistended, soft, bowel sounds positive, no rebound, no ascites, no appreciable mass Extremities: No significant cyanosis, clubbing, or edema bilateral lower  extremities     Data Review   Micro Results Recent Results (from the past 240 hour(s))  Culture, blood (routine x 2) Call MD if unable to obtain prior to antibiotics being given     Status: None   Collection Time: 02/10/15  9:55 PM  Result Value Ref Range Status   Specimen Description BLOOD LEFT ANTECUBITAL  Final   Special Requests BOTTLES DRAWN AEROBIC AND ANAEROBIC 5CC  Final   Culture NO GROWTH 5 DAYS  Final   Report Status 02/15/2015 FINAL  Final  Culture, blood (routine x 2) Call MD if unable to obtain prior to antibiotics being given     Status: None   Collection Time: 02/10/15 10:02 PM  Result Value Ref Range Status   Specimen Description BLOOD RIGHT ARM  Final   Special Requests IN PEDIATRIC BOTTLE [place]  Final   Culture NO GROWTH 5 DAYS  Final   Report Status 02/15/2015 FINAL  Final  MRSA PCR Screening     Status: Abnormal   Collection Time: 02/11/15  4:48 AM  Result Value Ref Range Status   MRSA by PCR POSITIVE (A) NEGATIVE Final    Comment:        The GeneXpert MRSA Assay (FDA approved for NASAL specimens only), is one component of a comprehensive MRSA colonization surveillance program. It is not intended to diagnose MRSA infection nor to guide or monitor treatment for MRSA infections. RESULT CALLED TO, READ BACK BY AND VERIFIED WITH: R HANEY @0649  02/05/15 MKELLY   Blood Culture (routine x 2)     Status: None (Preliminary result)   Collection Time: 02/14/15  3:05 PM  Result Value Ref Range Status   Specimen Description BLOOD LEFT HAND  Final   Special Requests BOTTLES DRAWN AEROBIC AND ANAEROBIC 4CC  Final   Culture NO GROWTH 2 DAYS  Final   Report Status PENDING  Incomplete  Blood Culture (routine x 2)     Status: None (Preliminary result)   Collection Time: 02/14/15  3:30 PM  Result Value Ref Range Status   Specimen Description BLOOD LEFT WRIST  Final   Special Requests BOTTLES DRAWN AEROBIC AND ANAEROBIC 5CC  Final   Culture NO GROWTH 2 DAYS  Final    Report Status PENDING  Incomplete  Urine culture     Status: None   Collection  Time: 02/14/15  6:41 PM  Result Value Ref Range Status   Specimen Description URINE, RANDOM  Final   Special Requests NONE  Final   Culture 3,000 COLONIES/mL INSIGNIFICANT GROWTH  Final   Report Status 02/15/2015 FINAL  Final  Urine culture     Status: None   Collection Time: 02/14/15 10:12 PM  Result Value Ref Range Status   Specimen Description URINE, RANDOM  Final   Special Requests NONE  Final   Culture NO GROWTH 2 DAYS  Final   Report Status 02/16/2015 FINAL  Final    Radiology Reports Ct Abdomen Pelvis Wo Contrast  02/16/2015  CLINICAL DATA:  Anemia, leukocytosis and tarry stools. Recent admission for sepsis. History of COPD, diabetes and hypertension. History of bladder cancer and plasma cell disorder. EXAM: CT CHEST, ABDOMEN AND PELVIS WITHOUT CONTRAST TECHNIQUE: Multidetector CT imaging of the chest, abdomen and pelvis was performed following the standard protocol without IV contrast. COMPARISON:  Chest CT 12/15/2007, abdominal pelvic CT 06/30/2014 and chest radiographs 02/10/2015. Skeletal survey 05/24/2014. FINDINGS: CT CHEST Mediastinum/Nodes: There are no enlarged mediastinal, hilar or axillary lymph nodes. Small mediastinal lymph nodes are stable. The thyroid gland, trachea and esophagus demonstrate no significant findings. The heart size is normal. There is no pericardial effusion. There is extensive atherosclerosis of the aorta, great vessels and coronary arteries. Lungs/Pleura: There is minimal dependent pleural fluid bilaterally. As demonstrated on recent radiographs, there is airspace disease with consolidation and air bronchograms in the left lower lobe. There is minimal dependent atelectasis at the right lung base. A 6 mm left upper lobe nodule on image number 16 is stable. There are no new or enlarging pulmonary nodules. Mild emphysematous changes are present. Musculoskeletal/Chest wall: There is a  T9 compression fracture which appears new and likely pathologic. There are multiple irregular lucent and sclerotic lesions throughout the thoracic spine and ribs. Small purely lytic lesion noted anteriorly in the left third rib (image 21). CT ABDOMEN AND PELVIS FINDINGS Hepatobiliary: As evaluated in the noncontrast state, the liver appears stable without suspicious findings. Small calcified gallstones are again noted. There is no gallbladder wall thickening or significant biliary dilatation. Pancreas: Unremarkable. No pancreatic ductal dilatation or surrounding inflammatory changes. Spleen: Normal in size without focal abnormality. Adrenals/Urinary Tract: Chronic right adrenal mass appears unchanged, measuring 3.4 x 3.1 cm on image 51. This contains calcifications, soft tissue and fatty components. 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 The left adrenal gland appears normal. Multiple bilateral renal cysts are again noted, measuring up to 6.0 cm in the lower pole of the right kidney and up to 5.8 cm in the interpolar region of the left kidney. Both kidneys demonstrate mild cortical thinning. There is no evidence of urinary tract calculus or hydronephrosis. The bladder appears normal. Stomach/Bowel: There appears to be mild soft tissue stranding around the distal stomach, proximal duodenum and pancreatic head. This is suboptimally evaluated due to the lack of intravenous and oral contrast. There is no extraluminal fluid collection. No evidence of bowel wall thickening, distention or surrounding inflammatory change. Stable diverticular changes of the sigmoid colon. Vascular/Lymphatic: There are no enlarged abdominal or pelvic lymph nodes. Stable extensive atherosclerosis of the aorta, its branches and the iliac arteries. There is stable dilatation of the right common iliac artery. Reproductive: Unremarkable. Other: Stable asymmetric fat in the left inguinal canal and stable  small umbilical hernia containing only fat. No evidence of ascites. Musculoskeletal: There are several new  mixed lytic and sclerotic lesions within the lumbar spine. There is a dominant 2.2 cm sclerotic lesion in the S1 segment. Postsurgical changes are noted in the lower lumbar spine. There is no evidence of lumbar pathologic fracture or epidural tumor. IMPRESSION: 1. Possible inflammation involving the distal stomach and proximal duodenum in this patient with tarry stools. This is suboptimally evaluated by this examination. Consider endoscopic correlation. 2. The remainder of the small bowel and colon demonstrate no significant findings. There is distal colonic diverticulosis. 3. Left lower lobe pneumonia.  Small bilateral pleural effusions. 4. Multiple lytic and sclerotic lesions in the spine, ribs and pelvis, suspicious for metastatic disease or multiple myeloma. Correlate clinically. Probable pathologic fracture at T9. 5. Stable additional incidental findings including extensive atherosclerosis, cholelithiasis and a chronic right adrenal mass. Electronically Signed   By: Richardean Sale M.D.   On: 02/16/2015 09:45   Dg Chest 2 View  02/10/2015  CLINICAL DATA:  Left-sided chest pain in the mornings for the last few days. EXAM: CHEST  2 VIEW COMPARISON:  04/12/2011 FINDINGS: Heart size is normal. There is atherosclerosis of the aorta. The right lung is clear. There is abnormal density in the left lower lobe posteriorly most consistent with bronchopneumonia. Follow-up to clearing recommended to ensure there is not a mass lesion. No effusion. No acute bone finding. IMPRESSION: Left lower lobe infiltrate most consistent with bronchopneumonia. Follow-up to clearing to rule out underlying mass. Electronically Signed   By: Nelson Chimes M.D.   On: 02/10/2015 15:12   Ct Head Wo Contrast  02/14/2015  CLINICAL DATA:  Syncope and headache. EXAM: CT HEAD WITHOUT CONTRAST TECHNIQUE: Contiguous axial images were obtained  from the base of the skull through the vertex without intravenous contrast. 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 Stable cortical atrophy. The brain demonstrates no evidence of hemorrhage, acute infarction, edema, mass effect, extra-axial fluid collection, hydrocephalus or mass lesion. The skull is unremarkable. IMPRESSION: No acute findings. 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 Electronically Signed   By: Aletta Edouard M.D.   On: 02/14/2015 17:51   Ct Chest Wo Contrast  02/16/2015  CLINICAL DATA:  Anemia, leukocytosis and tarry stools. Recent admission for sepsis. History of COPD, diabetes and hypertension. History of bladder cancer and plasma cell disorder. EXAM: CT CHEST, ABDOMEN AND PELVIS WITHOUT CONTRAST TECHNIQUE: Multidetector CT imaging of the chest, abdomen and pelvis was performed following the standard protocol without IV contrast. COMPARISON:  Chest CT 12/15/2007, abdominal pelvic CT 06/30/2014 and chest radiographs 02/10/2015. Skeletal survey 05/24/2014. FINDINGS: CT CHEST Mediastinum/Nodes: There are no enlarged mediastinal, hilar or axillary lymph nodes. Small mediastinal lymph nodes are stable. The thyroid gland, trachea and esophagus demonstrate no significant findings. The heart size is normal. There is no pericardial effusion. There is extensive atherosclerosis of the aorta, great vessels and coronary arteries. Lungs/Pleura: There is minimal dependent pleural fluid bilaterally. As demonstrated on recent radiographs, there is airspace disease with consolidation and air bronchograms in the left lower lobe. There is minimal dependent atelectasis at the right lung base. A 6 mm left upper lobe nodule on image number 16 is stable. There are no new or enlarging pulmonary nodules. Mild emphysematous changes are present. Musculoskeletal/Chest wall: There is a T9 compression  fracture which appears new and likely pathologic. There are multiple irregular lucent and sclerotic lesions throughout the thoracic spine and ribs. Small purely  lytic lesion noted anteriorly in the left third rib (image 21). CT ABDOMEN AND PELVIS FINDINGS Hepatobiliary: As evaluated in the noncontrast state, the liver appears stable without suspicious findings. Small calcified gallstones are again noted. There is no gallbladder wall thickening or significant biliary dilatation. Pancreas: Unremarkable. No pancreatic ductal dilatation or surrounding inflammatory changes. Spleen: Normal in size without focal abnormality. Adrenals/Urinary Tract: Chronic right adrenal mass appears unchanged, measuring 3.4 x 3.1 cm on image 51. This contains calcifications, soft tissue and fatty components. 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 The left adrenal gland appears normal. Multiple bilateral renal cysts are again noted, measuring up to 6.0 cm in the lower pole of the right kidney and up to 5.8 cm in the interpolar region of the left kidney. Both kidneys demonstrate mild cortical thinning. There is no evidence of urinary tract calculus or hydronephrosis. The bladder appears normal. Stomach/Bowel: There appears to be mild soft tissue stranding around the distal stomach, proximal duodenum and pancreatic head. This is suboptimally evaluated due to the lack of intravenous and oral contrast. There is no extraluminal fluid collection. No evidence of bowel wall thickening, distention or surrounding inflammatory change. Stable diverticular changes of the sigmoid colon. Vascular/Lymphatic: There are no enlarged abdominal or pelvic lymph nodes. Stable extensive atherosclerosis of the aorta, its branches and the iliac arteries. There is stable dilatation of the right common iliac artery. Reproductive: Unremarkable. Other: Stable asymmetric fat in the left inguinal canal and stable small umbilical  hernia containing only fat. No evidence of ascites. Musculoskeletal: There are several new mixed lytic and sclerotic lesions within the lumbar spine. There is a dominant 2.2 cm sclerotic lesion in the S1 segment. Postsurgical changes are noted in the lower lumbar spine. There is no evidence of lumbar pathologic fracture or epidural tumor. IMPRESSION: 1. Possible inflammation involving the distal stomach and proximal duodenum in this patient with tarry stools. This is suboptimally evaluated by this examination. Consider endoscopic correlation. 2. The remainder of the small bowel and colon demonstrate no significant findings. There is distal colonic diverticulosis. 3. Left lower lobe pneumonia.  Small bilateral pleural effusions. 4. Multiple lytic and sclerotic lesions in the spine, ribs and pelvis, suspicious for metastatic disease or multiple myeloma. Correlate clinically. Probable pathologic fracture at T9. 5. Stable additional incidental findings including extensive atherosclerosis, cholelithiasis and a chronic right adrenal mass. Electronically Signed   By: Richardean Sale M.D.   On: 02/16/2015 09:45   Mr Abdomen Wo Contrast  02/17/2015  CLINICAL DATA:  Evaluate adrenal mass EXAM: MRI ABDOMEN WITHOUT CONTRAST TECHNIQUE: Multiplanar multisequence MR imaging was performed without the administration of intravenous contrast. COMPARISON:  02/16/2015 FINDINGS: Lower chest: Small left effusion and left lower lobe airspace consolidation is noted. Hepatobiliary: Mild hepatic steatosis. There is no focal liver abnormality. Stones are identified within the gallbladder. These measure up to 6 mm. No biliary dilatation. The common bile duct has a normal caliber. Pancreas: Pancreas is unremarkable. Spleen: Negative Adrenals/Urinary Tract: Fat signal intensity within the 2.7 cm right adrenal mass is identified compatible with benign adrenal myelolipoma. Normal appearance of the left adrenal gland. Multiple bilateral renal  cysts identified. The largest arises from the upper pole the right kidney measuring 6.3 cm. 5.5 cm cyst arises from the midpole of left kidney. No obstructive uropathy. Stomach/Bowel: The stomach is normal. The visualized upper abdominal bowel loops have a normal caliber. Vascular/Lymphatic: Aortic atherosclerosis noted. No aneurysm. No upper abdominal adenopathy identified. Other:  No ascites or focal fluid collections identified within the upper abdomen. Musculoskeletal: Inferior endplate lesion at the T9 level is again identified. Lesion involving the posterior aspect of the T12 vertebra and right posterior element is again identified suspicious for metastatic disease, image number 15 of series 4. IMPRESSION: 1. 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 2. Mild hepatic steatosis. 3. Gallstones 4. Aortic atherosclerosis 5. T12 bone lesions suspicious for metastatic disease. 6. T9 compression fracture. Electronically Signed   By: Kerby Moors M.D.   On: 02/17/2015 07:39   Dg Chest Port 1 View  02/14/2015  CLINICAL DATA:  Pneumonia. EXAM: PORTABLE CHEST 1 VIEW COMPARISON:  02/10/2015 FINDINGS: Left lower lobe infiltrate again identified. This may be slightly smaller and less dense compared to the prior chest x-ray. No edema or pleural fluid. The heart size and mediastinal contours are normal. IMPRESSION: Slightly smaller and less dense appearing left lower lobe pneumonia. Electronically Signed   By: Aletta Edouard M.D.   On: 02/14/2015 15:18     CBC  Recent Labs Lab 02/10/15 1525 02/11/15 0323  02/14/15 1530  02/14/15 2213 02/15/15 0645 02/15/15 2339 02/16/15 0725 02/16/15 1010 02/16/15 2106 02/17/15 0528  WBC 17.1* 16.1*  < > 17.9*  --  17.5* 16.0* 24.5*  --  25.0*  --  21.3*  HGB 10.8* 10.5*  < > 8.4*  < > 8.6* 7.6* 6.7* 7.2* 6.6* 8.5* 8.0*  HCT 36.6* 35.7*  < > 27.9*  < > 28.6* 25.3* 21.5* 22.9* 20.3* 25.2* 24.0*  PLT 265 232  < > 236  --   204 210 237  --  198  --  169  MCV 93.8 93.5  < > 90.9  --  90.8 90.7 91.1  --  87.1  --  87.9  MCH 27.7 27.5  < > 27.4  --  27.3 27.2 28.4  --  28.3  --  29.3  MCHC 29.5* 29.4*  < > 30.1  --  30.1 30.0 31.2  --  32.5  --  33.3  RDW 14.8 14.7  < > 15.0  --  15.0 15.1 15.0  --  15.8*  --  15.6*  LYMPHSABS 1.4 1.2  --  1.3  --  1.1  --   --   --   --   --   --   MONOABS 1.7* 1.6*  --  2.0*  --  1.7*  --   --   --   --   --   --   EOSABS 0.2 0.2  --  0.1  --  0.0  --   --   --   --   --   --   BASOSABS 0.0 0.0  --  0.0  --  0.0  --   --   --   --   --   --   < > = values in this interval not displayed.  Chemistries   Recent Labs Lab 02/14/15 1530 02/14/15 1548 02/14/15 2213 02/15/15 0645 02/16/15 0725 02/17/15 0528  NA 136 134* 140 138 137 140  K 4.9 5.1 4.2 4.3 5.0 4.7  CL 99* 98* 105 107 109 110  CO2 26  --  25 22 23 22   GLUCOSE 120* 116* 117* 159* 224* 192*  BUN 59* 66* 43* 34* 50* 36*  CREATININE 2.03* 1.90* 1.31* 0.92 0.92 0.77  CALCIUM 8.0*  --  7.4* 7.4* 7.4* 7.5*  MG  --   --  1.7  --   --   --   AST 21  --  22 17  --  19  ALT 21  --  19 17  --  17  ALKPHOS 70  --  78 65  --  61  BILITOT 0.3  --  0.7 0.5  --  0.5   ------------------------------------------------------------------------------------------------------------------ estimated creatinine clearance is 76 mL/min (by C-G formula based on Cr of 0.77). ------------------------------------------------------------------------------------------------------------------  Recent Labs  02/14/15 2213  HGBA1C 7.1*   ------------------------------------------------------------------------------------------------------------------ No results for input(s): CHOL, HDL, LDLCALC, TRIG, CHOLHDL, LDLDIRECT in the last 72 hours. ------------------------------------------------------------------------------------------------------------------  Recent Labs  02/14/15 2213  TSH 1.034    ------------------------------------------------------------------------------------------------------------------ No results for input(s): VITAMINB12, FOLATE, FERRITIN, TIBC, IRON, RETICCTPCT in the last 72 hours.  Coagulation profile  Recent Labs Lab 02/14/15 2213  INR 1.42    No results for input(s): DDIMER in the last 72 hours.  Cardiac Enzymes  Recent Labs Lab 02/14/15 2213 02/15/15 0645  TROPONINI <0.03  <0.03 <0.03   ------------------------------------------------------------------------------------------------------------------ Invalid input(s): POCBNP   CBG:  Recent Labs Lab 02/16/15 0757 02/16/15 1123 02/16/15 1705 02/16/15 2104 02/17/15 0730  GLUCAP 172* 166* 142* 143* 194*       Studies: Ct Abdomen Pelvis Wo Contrast  02/16/2015  CLINICAL DATA:  Anemia, leukocytosis and tarry stools. Recent admission for sepsis. History of COPD, diabetes and hypertension. History of bladder cancer and plasma cell disorder. EXAM: CT CHEST, ABDOMEN AND PELVIS WITHOUT CONTRAST TECHNIQUE: Multidetector CT imaging of the chest, abdomen and pelvis was performed following the standard protocol without IV contrast. COMPARISON:  Chest CT 12/15/2007, abdominal pelvic CT 06/30/2014 and chest radiographs 02/10/2015. Skeletal survey 05/24/2014. FINDINGS: CT CHEST Mediastinum/Nodes: There are no enlarged mediastinal, hilar or axillary lymph nodes. Small mediastinal lymph nodes are stable. The thyroid gland, trachea and esophagus demonstrate no significant findings. The heart size is normal. There is no pericardial effusion. There is extensive atherosclerosis of the aorta, great vessels and coronary arteries. Lungs/Pleura: There is minimal dependent pleural fluid bilaterally. As demonstrated on recent radiographs, there is airspace disease with consolidation and air bronchograms in the left lower lobe. There is minimal dependent atelectasis at the right lung base. A 6 mm left upper lobe  nodule on image number 16 is stable. There are no new or enlarging pulmonary nodules. Mild emphysematous changes are present. Musculoskeletal/Chest wall: There is a T9 compression fracture which appears new and likely pathologic. There are multiple irregular lucent and sclerotic lesions throughout the thoracic spine and ribs. Small purely lytic lesion noted anteriorly in the left third rib (image 21). CT ABDOMEN AND PELVIS FINDINGS Hepatobiliary: As evaluated in the noncontrast state, the liver appears stable without suspicious findings. Small calcified gallstones are again noted. There is no gallbladder wall thickening or significant biliary dilatation. Pancreas: Unremarkable. No pancreatic ductal dilatation or surrounding inflammatory changes. Spleen: Normal in size without focal abnormality. Adrenals/Urinary Tract: Chronic right adrenal mass appears unchanged, measuring 3.4 x 3.1 cm on image 51. This contains calcifications, soft tissue and fatty components. 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 The left adrenal gland appears normal. Multiple bilateral renal cysts are again noted, measuring up to 6.0 cm in the lower pole of the right kidney and up to 5.8 cm in the interpolar region of the left kidney. Both kidneys demonstrate mild cortical thinning. There is no evidence of urinary tract calculus or hydronephrosis. The bladder appears normal. Stomach/Bowel: There appears to be mild soft  tissue stranding around the distal stomach, proximal duodenum and pancreatic head. This is suboptimally evaluated due to the lack of intravenous and oral contrast. There is no extraluminal fluid collection. No evidence of bowel wall thickening, distention or surrounding inflammatory change. Stable diverticular changes of the sigmoid colon. Vascular/Lymphatic: There are no enlarged abdominal or pelvic lymph nodes. Stable extensive atherosclerosis of the aorta, its branches and the iliac  arteries. There is stable dilatation of the right common iliac artery. Reproductive: Unremarkable. Other: Stable asymmetric fat in the left inguinal canal and stable small umbilical hernia containing only fat. No evidence of ascites. Musculoskeletal: There are several new mixed lytic and sclerotic lesions within the lumbar spine. There is a dominant 2.2 cm sclerotic lesion in the S1 segment. Postsurgical changes are noted in the lower lumbar spine. There is no evidence of lumbar pathologic fracture or epidural tumor. IMPRESSION: 1. Possible inflammation involving the distal stomach and proximal duodenum in this patient with tarry stools. This is suboptimally evaluated by this examination. Consider endoscopic correlation. 2. The remainder of the small bowel and colon demonstrate no significant findings. There is distal colonic diverticulosis. 3. Left lower lobe pneumonia.  Small bilateral pleural effusions. 4. Multiple lytic and sclerotic lesions in the spine, ribs and pelvis, suspicious for metastatic disease or multiple myeloma. Correlate clinically. Probable pathologic fracture at T9. 5. Stable additional incidental findings including extensive atherosclerosis, cholelithiasis and a chronic right adrenal mass. Electronically Signed   By: Richardean Sale M.D.   On: 02/16/2015 09:45   Ct Chest Wo Contrast  02/16/2015  CLINICAL DATA:  Anemia, leukocytosis and tarry stools. Recent admission for sepsis. History of COPD, diabetes and hypertension. History of bladder cancer and plasma cell disorder. EXAM: CT CHEST, ABDOMEN AND PELVIS WITHOUT CONTRAST TECHNIQUE: Multidetector CT imaging of the chest, abdomen and pelvis was performed following the standard protocol without IV contrast. COMPARISON:  Chest CT 12/15/2007, abdominal pelvic CT 06/30/2014 and chest radiographs 02/10/2015. Skeletal survey 05/24/2014. FINDINGS: CT CHEST Mediastinum/Nodes: There are no enlarged mediastinal, hilar or axillary lymph nodes. Small  mediastinal lymph nodes are stable. The thyroid gland, trachea and esophagus demonstrate no significant findings. The heart size is normal. There is no pericardial effusion. There is extensive atherosclerosis of the aorta, great vessels and coronary arteries. Lungs/Pleura: There is minimal dependent pleural fluid bilaterally. As demonstrated on recent radiographs, there is airspace disease with consolidation and air bronchograms in the left lower lobe. There is minimal dependent atelectasis at the right lung base. A 6 mm left upper lobe nodule on image number 16 is stable. There are no new or enlarging pulmonary nodules. Mild emphysematous changes are present. Musculoskeletal/Chest wall: There is a T9 compression fracture which appears new and likely pathologic. There are multiple irregular lucent and sclerotic lesions throughout the thoracic spine and ribs. Small purely lytic lesion noted anteriorly in the left third rib (image 21). CT ABDOMEN AND PELVIS FINDINGS Hepatobiliary: As evaluated in the noncontrast state, the liver appears stable without suspicious findings. Small calcified gallstones are again noted. There is no gallbladder wall thickening or significant biliary dilatation. Pancreas: Unremarkable. No pancreatic ductal dilatation or surrounding inflammatory changes. Spleen: Normal in size without focal abnormality. Adrenals/Urinary Tract: Chronic right adrenal mass appears unchanged, measuring 3.4 x 3.1 cm on image 51. This contains calcifications, soft tissue and fatty components. 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 The left adrenal gland appears normal. Multiple bilateral renal cysts are again noted, measuring up to  6.0 cm in the lower pole of the right kidney and up to 5.8 cm in the interpolar region of the left kidney. Both kidneys demonstrate mild cortical thinning. There is no evidence of urinary tract calculus or hydronephrosis. The bladder appears  normal. Stomach/Bowel: There appears to be mild soft tissue stranding around the distal stomach, proximal duodenum and pancreatic head. This is suboptimally evaluated due to the lack of intravenous and oral contrast. There is no extraluminal fluid collection. No evidence of bowel wall thickening, distention or surrounding inflammatory change. Stable diverticular changes of the sigmoid colon. Vascular/Lymphatic: There are no enlarged abdominal or pelvic lymph nodes. Stable extensive atherosclerosis of the aorta, its branches and the iliac arteries. There is stable dilatation of the right common iliac artery. Reproductive: Unremarkable. Other: Stable asymmetric fat in the left inguinal canal and stable small umbilical hernia containing only fat. No evidence of ascites. Musculoskeletal: There are several new mixed lytic and sclerotic lesions within the lumbar spine. There is a dominant 2.2 cm sclerotic lesion in the S1 segment. Postsurgical changes are noted in the lower lumbar spine. There is no evidence of lumbar pathologic fracture or epidural tumor. IMPRESSION: 1. Possible inflammation involving the distal stomach and proximal duodenum in this patient with tarry stools. This is suboptimally evaluated by this examination. Consider endoscopic correlation. 2. The remainder of the small bowel and colon demonstrate no significant findings. There is distal colonic diverticulosis. 3. Left lower lobe pneumonia.  Small bilateral pleural effusions. 4. Multiple lytic and sclerotic lesions in the spine, ribs and pelvis, suspicious for metastatic disease or multiple myeloma. Correlate clinically. Probable pathologic fracture at T9. 5. Stable additional incidental findings including extensive atherosclerosis, cholelithiasis and a chronic right adrenal mass. Electronically Signed   By: Richardean Sale M.D.   On: 02/16/2015 09:45   Mr Abdomen Wo Contrast  02/17/2015  CLINICAL DATA:  Evaluate adrenal mass EXAM: MRI ABDOMEN  WITHOUT CONTRAST TECHNIQUE: Multiplanar multisequence MR imaging was performed without the administration of intravenous contrast. COMPARISON:  02/16/2015 FINDINGS: Lower chest: Small left effusion and left lower lobe airspace consolidation is noted. Hepatobiliary: Mild hepatic steatosis. There is no focal liver abnormality. Stones are identified within the gallbladder. These measure up to 6 mm. No biliary dilatation. The common bile duct has a normal caliber. Pancreas: Pancreas is unremarkable. Spleen: Negative Adrenals/Urinary Tract: Fat signal intensity within the 2.7 cm right adrenal mass is identified compatible with benign adrenal myelolipoma. Normal appearance of the left adrenal gland. Multiple bilateral renal cysts identified. The largest arises from the upper pole the right kidney measuring 6.3 cm. 5.5 cm cyst arises from the midpole of left kidney. No obstructive uropathy. Stomach/Bowel: The stomach is normal. The visualized upper abdominal bowel loops have a normal caliber. Vascular/Lymphatic: Aortic atherosclerosis noted. No aneurysm. No upper abdominal adenopathy identified. Other: No ascites or focal fluid collections identified within the upper abdomen. Musculoskeletal: Inferior endplate lesion at the T9 level is again identified. Lesion involving the posterior aspect of the T12 vertebra and right posterior element is again identified suspicious for metastatic disease, image number 15 of series 4. IMPRESSION: 1. 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 2. Mild hepatic steatosis. 3. Gallstones 4. Aortic atherosclerosis 5. T12 bone lesions suspicious for metastatic disease. 6. T9 compression fracture. Electronically Signed   By: Kerby Moors M.D.   On: 02/17/2015 07:39      Lab Results  Component Value Date   HGBA1C 7.1* 02/14/2015   HGBA1C  7.0* 02/10/2015   HGBA1C 6.1* 09/29/2014   Lab Results  Component Value Date   CREATININE 0.77  02/17/2015       Scheduled Meds: . atorvastatin  10 mg Oral Daily  . ceFEPime (MAXIPIME) IV  2 g Intravenous Q12H  . Chlorhexidine Gluconate Cloth  6 each Topical Q0600  . diltiazem  10 mg Intravenous Once  . diltiazem  30 mg Oral 4 times per day  . DULoxetine  30 mg Oral QPM  . feeding supplement (ENSURE ENLIVE)  237 mL Oral BID BM  . feeding supplement (GLUCERNA SHAKE)  237 mL Oral TID BM  . ferrous sulfate  325 mg Oral BID WC  . fluticasone  2 spray Each Nare Daily  . gabapentin  100 mg Oral QHS  . insulin aspart  0-9 Units Subcutaneous TID WC  . levothyroxine  50 mcg Oral QAC breakfast  . metronidazole  500 mg Intravenous Q8H  . mupirocin ointment  1 application Nasal BID  . [START ON 02/19/2015] pantoprazole (PROTONIX) IV  40 mg Intravenous Q12H  . polyethylene glycol  17 g Oral Daily  . vancomycin  1,000 mg Intravenous Q12H   Continuous Infusions: . sodium chloride 125 mL/hr at 02/16/15 0100  . sodium chloride 20 mL/hr at 02/16/15 1715  . pantoprozole (PROTONIX) infusion 8 mg/hr (02/16/15 1314)    Principal Problem:   Sepsis ([place]) Active Problems:   Type 2 diabetes mellitus ([place])   Hypothyroidism   Hypertension   Hyperlipidemia   History of depression   COPD with acute exacerbation ([facility])   Low back pain radiating to left leg    Time spent: 45 minutes   [facility] Pager [phone]. If 7PM-7AM, please contact night-coverage at [email or web address], password [facility] 02/17/2015, 10:46 AM  LOS: 3 days

## 2015-02-17 NOTE — Progress Notes (Signed)
Pharmacy Antibiotic Follow-up Note  Taylor [name] is a 82 y.o. year-old male admitted on 02/14/2015.  The patient is currently on day #4 of Vancomycin and Cefepime for sepsis coverage.  Day # 2 Flagyl IV added for aspiration pneumonia coverage  Assessment/Plan:  Afebrile, WBC up yesterday but trending down again. Lactic acid 2.86>1.6 on 1/10.  On Protonix drip for GI bleed.  Continue Cefepime 2gm IV q12hrs.  Continue Vancomycin 1gm IV q12hrs.  Will plan to check vanc trough prior to 4pm dose on 02/18/15.  Follow renal function, progress.  Temp (24hrs), Avg:98.5 F (36.9 C), Min:97.9 F (36.6 C), Max:99.6 F (37.6 C)   Recent Labs Lab 02/14/15 2213 02/15/15 0645 02/15/15 2339 02/16/15 1010 02/17/15 0528  WBC 17.5* 16.0* 24.5* 25.0* 21.3*    Recent Labs Lab 02/14/15 1548 02/14/15 2213 02/15/15 0645 02/16/15 0725 02/17/15 0528  CREATININE 1.90* 1.31* 0.92 0.92 0.77   Estimated Creatinine Clearance: 76 mL/min (by C-G formula based on Cr of 0.77).    Allergies  Allergen Reactions  . Amoxicillin Palpitations  . Motrin [Ibuprofen] Palpitations  . Chantix [Varenicline Tartrate] Other (See Comments)    depression    Antimicrobials this admission: Cefepime 1/10 >> Vancomycin 1/10>> Flagyl IV 1/12>> CHG/Bactroban 1/12>> (1/16)  Levels/dose changes this admission:  none yet  Microbiology results: 1/6 Blood cx x 2 - neg 1/10 Blood cx x 2 - ng x 3 days so far 1/10 Urine (repeat 2212) - neg 1/10 urine (1841)- insignificant growth 1/7 MRSA screen POS   Arty Baumgartner, [place] Pager: [phone] 02/17/2015 3:42 PM

## 2015-02-17 NOTE — Consult Note (Signed)
Consultation Note Date: 02/17/2015   Patient Name: Taylor [name]  DOB: 1928/06/14  MRN: [id number]  Age / Sex: 82 y.o., male  PCP: Taylor Bowen, MD Referring Physician: Reyne Dumas, MD  Reason for Consultation: Establishing goals of care and Psychosocial/spiritual support  Clinical Assessment/Narrative:   82 year old male with a history of paroxysmal atrial fibrillation on xarelto, COPD, on nocturnal oxygen 2.5 L, diabetes mellitus, hypertension, recently admitted 1/6-1/8 for community-acquired pneumonia, left lower lobe, discharged home on levofloxacin for 7 days, who presents with weakness, persistent nausea, syncopal episode at home, and the patient remembers hitting the back of his head.  Family reports continued physical and functional decline, poor po intake.    Patient has a history of chronic low back pain, had an MRI 2 weeks ago, was found to have lesions concerning for metastatic disease and was referred to Dr. Alen [name] for further evaluation.  Admitted before seen.  Further diagnostics have been difficult 2/2 to poor respiratory status.  Dr Taylor [name] saw Taylor [name] inpatient and recommends proceed with endoscopy and biopsy of the distal stomach and proximal small intestine's if possible. If this procedure cannot be done or not diagnostic than a percutaneous biopsy of his bone lesions would be the next step.  Patient is open to further diagnositc, he is hopeful for treatment to extend life.  This NP Taylor [name] reviewed medical records, received report from team, assessed the patient and then meet with daughter Taylor [name] at bedside  to discuss current medical situation   A  discussion was had today regarding importance of documentation of  advanced directives.  Concepts specific to code status,  was had.  Values and goals of care important to patient and family were attempted to be elicited.  Patient and family "need  more information" before further discussion regarding treatment plan.  Concept of Hospice and Palliative Care were discussed and differentiated.   Questions and concerns addressed.  Hard Choices booklet left for review. Family encouraged to call with questions or concerns.  PMT will continue to support holistically.  Patient and his daughter  understands "how sick he is and he might not have much time".  We agree to meet again on Monday at 2 pm, I encouraged her continue  to ask questions and get further information from her father's attendings through the weekend.   I worry that he may decompensate in spite of current treatment plan  Primary Decision Maker: pateitn with support of daughter    SUMMARY OF RECOMMENDATIONS  -continue current medical interventions, family is hopeful for improvement -make decisions regarding options once all information is gathered, awaiting EGD - re meet with this NP on Monday at 2pm for further discussion   Code Status/Advance Care Planning:  DNR-documented today      Code Status Orders        Start     Ordered   02/14/15 1720  Full code   Continuous     02/14/15 1721    Code Status History    Date Active Date Inactive Code Status Order ID Comments User Context   02/10/2015  7:35 PM 02/12/2015  9:27 PM Full Code [id number]  Taylor Bulls, MD ED   10/11/2014  2:58 PM 10/13/2014  6:39 PM Full Code [id number]  Taylor Calender, PA-C Inpatient   03/02/2014 11:23 PM 03/07/2014  7:29 PM Full Code [id number]  Taylor Battles, MD ED        Palliative Prophylaxis:   Aspiration, Bowel Regimen, Delirium Protocol, Frequent  Pain Assessment and Turn Reposition   Psycho-social/Spiritual:  Support System: Strong Desire for further Chaplaincy support:no Additional Recommendations: Education on Hospice   Discharge Planning: Pending outcomes   Chief Complaint/ Primary Diagnoses: Present on Admission:  . Hypothyroidism . Hypertension . Hyperlipidemia . COPD  with acute exacerbation ([place]) . Low back pain radiating to left leg  I have reviewed the medical record, interviewed the patient and family, and examined the patient. The following aspects are pertinent.  Past Medical History  Diagnosis Date  . Hypertension   . Hyperlipidemia   . Hypothyroidism   . COPD (chronic obstructive pulmonary disease) ([place])   . Atrial fibrillation ([place])   . Glaucoma   . Anxiety   . History of bladder cancer   . Lung nodule     SMALL  . OSA (obstructive sleep apnea)   . Heart murmur   . Dysrhythmia     Hx of Atrial Flutter  . Deep vein blood clot of left lower extremity ([place])   . On home oxygen therapy     At night, during the day PRN 2.5L  . Cancer ([place])     bladder cancer, skin cancer  . Diabetes mellitus without complication (HCC)     Type 2  . Depression   . Arthritis   . Osteoporosis   . Pneumonia 02/2015  . Kidney stones 2016   Social History   Social History  . Marital Status: Divorced    Spouse Name: N/A  . Number of Children: 1  . Years of Education: N/A   Occupational History  . Retired     worked at a [employer]   Social History Main Topics  . Smoking status: Former Smoker -- 1.50 packs/day for 60 years    Types: Cigarettes    Quit date: 11/27/2010  . Smokeless tobacco: Never Used  . Alcohol Use: No  . Drug Use: No  . Sexual Activity: No   Other Topics Concern  . None   Social History Narrative   Family History  Problem Relation Age of Onset  . Cancer - Lung Mother     82  . Heart attack Mother   . Heart Problems Father   . Diabetes Brother   . Alcoholism Brother   . Other Daughter     Taylor [name]  . Other Other     HALF SISTER [employer]   Scheduled Meds: . atorvastatin  10 mg Oral Daily  . ceFEPime (MAXIPIME) IV  2 g Intravenous Q12H  . Chlorhexidine Gluconate Cloth  6 each Topical Q0600  . diltiazem  10 mg Intravenous Once  . diltiazem  30 mg Oral 4 times per day  . DULoxetine  30 mg  Oral QPM  . feeding supplement (ENSURE ENLIVE)  237 mL Oral BID BM  . feeding supplement (GLUCERNA SHAKE)  237 mL Oral TID BM  . ferrous sulfate  325 mg Oral BID WC  . fluticasone  2 spray Each Nare Daily  . gabapentin  100 mg Oral QHS  . insulin aspart  0-9 Units Subcutaneous TID WC  . latanoprost  1 drop Both Eyes QPM  . levothyroxine  50 mcg Oral QAC breakfast  . metronidazole  500 mg Intravenous Q8H  . mupirocin ointment  1 application Nasal BID  . [START ON 02/19/2015] pantoprazole (PROTONIX) IV  40 mg Intravenous Q12H  . polyethylene glycol  17 g Oral Daily  . vancomycin  1,000 mg Intravenous Q12H   Continuous  Infusions: . sodium chloride 125 mL/hr at 02/16/15 0100  . sodium chloride 20 mL/hr at 02/16/15 1715  . pantoprozole (PROTONIX) infusion 8 mg/hr (02/17/15 1200)   PRN Meds:.acetaminophen **OR** acetaminophen, albuterol, ALPRAZolam, levalbuterol, ondansetron **OR** ondansetron (ZOFRAN) IV, oxyCODONE-acetaminophen Medications Prior to Admission:  Prior to Admission medications   Medication Sig Start Date End Date Taking? Authorizing Provider  albuterol (PROVENTIL HFA;VENTOLIN HFA) 108 (90 BASE) MCG/ACT inhaler Inhale 2 puffs into the lungs every 4 (four) hours as needed for wheezing or shortness of breath.   Yes Historical Provider, MD  albuterol (PROVENTIL) (2.5 MG/3ML) 0.083% nebulizer solution Take 2.5 mg by nebulization every 6 (six) hours as needed for shortness of breath.  04/25/14  Yes Historical Provider, MD  alendronate (FOSAMAX) 70 MG tablet Take 70 mg by mouth once a week. Monday 06/08/14  Yes Historical Provider, MD  ALPRAZolam Duanne Moron) 0.25 MG tablet Take one tablet by mouth every night at bedtime as needed for anxiety 10/28/14  Yes Tiffany L Reed, DO  atorvastatin (LIPITOR) 10 MG tablet Take 10 mg by mouth daily.   Yes Historical Provider, MD  Calcium Carbonate-Vitamin D (CALCIUM + D PO) Take 2 tablets by mouth daily.   Yes Historical Provider, MD  diltiazem (CARDIZEM  CD) 180 MG 24 hr capsule Take 180 mg by mouth daily.   Yes Historical Provider, MD  DULoxetine (CYMBALTA) 30 MG capsule Take 30 mg by mouth every evening.    Yes Historical Provider, MD  ferrous sulfate 325 (65 FE) MG EC tablet Take 325 mg by mouth 2 (two) times daily.   Yes Historical Provider, MD  fluticasone (FLONASE) 50 MCG/ACT nasal spray Place 2 sprays into the nose daily.     Yes Historical Provider, MD  gabapentin (NEURONTIN) 100 MG capsule Take 100 mg by mouth at bedtime. 09/06/14  Yes Historical Provider, MD  hydrochlorothiazide (MICROZIDE) 12.5 MG capsule Take 1 capsule (12.5 mg total) by mouth daily. 02/12/15  Yes Ripudeep Krystal Eaton, MD  ketoconazole (NIZORAL) 2 % cream Apply 2 application topically as needed. rash 08/28/14  Yes Historical Provider, MD  latanoprost (XALATAN) 0.005 % ophthalmic solution Place 1 drop into both eyes every evening.    Yes Historical Provider, MD  levofloxacin (LEVAQUIN) 750 MG tablet Take 1 tablet (750 mg total) by mouth daily. X 7days 02/12/15  Yes Ripudeep Krystal Eaton, MD  levothyroxine (SYNTHROID, LEVOTHROID) 100 MCG tablet Take 50-100 mcg by mouth daily before breakfast. Takes 11mg on sun and mon Takes 1027m all other days   Yes Historical Provider, MD  metFORMIN (GLUCOPHAGE) 500 MG tablet Take 500 mg by mouth daily.    Yes Historical Provider, MD  oxyCODONE-acetaminophen (PERCOCET) 5-325 MG per tablet Take one tablet by mouth every 4 hours as needed for moderate pain. Do not exceed 4gm of Tylenol in 24 hours 10/28/14  Yes MoGildardo CrankerDO  OXYGEN Inhale 2.5 L into the lungs at bedtime. Only use oxygen at night   Yes Historical Provider, MD  polyethylene glycol (MIRALAX / GLYCOLAX) packet Take 17 g by mouth daily.     Yes Historical Provider, MD  rivaroxaban (XARELTO) 10 MG TABS tablet Take 1 tablet (10 mg total) by mouth daily. 10/11/14  Yes BrLovett CalenderPA-C   Allergies  Allergen Reactions  . Amoxicillin Palpitations  . Motrin [Ibuprofen] Palpitations  . Chantix  [Varenicline Tartrate] Other (See Comments)    depression    Review of Systems  Constitutional: Positive for activity change, appetite change and fatigue.  Physical Exam  Constitutional: He appears lethargic. He appears ill.  HENT:  Mouth/Throat: Oropharynx is clear and moist.  Cardiovascular: Normal rate, regular rhythm and normal heart sounds.   Respiratory: He has decreased breath sounds.  Musculoskeletal:       Right shoulder: He exhibits decreased range of motion and decreased strength.  Neurological: He appears lethargic.  Skin: Skin is warm and dry.    Vital Signs: BP 100/45 mmHg  Pulse 78  Temp(Src) 98.2 F (36.8 C) (Oral)  Resp 16  Ht '5\' 7"'$  (1.702 m)  Wt 96.435 kg (212 lb 9.6 oz)  BMI 33.29 kg/m2  SpO2 99%  SpO2: SpO2: 99 % O2 Device:SpO2: 99 % O2 Flow Rate: .O2 Flow Rate (L/min): 1 L/min  IO: Intake/output summary:  Intake/Output Summary (Last 24 hours) at 02/17/15 1244 Last data filed at 02/17/15 1200  Gross per 24 hour  Intake   2930 ml  Output    950 ml  Net   1980 ml    LBM: Last BM Date: 02/16/15 Baseline Weight: Weight: 90.719 kg (200 lb) Most recent weight: Weight: 96.435 kg (212 lb 9.6 oz)      Palliative Assessment/Data:  Flowsheet Rows        Most Recent Value   Intake Tab    Referral Department  Hospitalist   Unit at Time of Referral  Cardiac/Telemetry Unit   Palliative Care Primary Diagnosis  Cancer   Date Notified  02/16/15   Palliative Care Type  New Palliative care   Reason for referral  Clarify Goals of Care   Date of Admission  02/14/15   # of days IP prior to Palliative referral  2   Clinical Assessment    Psychosocial & Spiritual Assessment    Palliative Care Outcomes       Additional Data Reviewed:  CBC:    Component Value Date/Time   WBC 21.3* 02/17/2015 0528   WBC 11.1 10/24/2014   WBC 9.7 05/24/2014 1424   WBC 13.4* 04/12/2011 1634   HGB 8.0* 02/17/2015 0528   HGB 12.1* 05/24/2014 1424   HGB 13.2 04/12/2011  1634   HCT 24.0* 02/17/2015 0528   HCT 38.5 05/24/2014 1424   HCT 39.6* 04/12/2011 1634   PLT 169 02/17/2015 0528   PLT 212 05/24/2014 1424   PLT 173 04/12/2011 1634   MCV 87.9 02/17/2015 0528   MCV 98.7* 05/24/2014 1424   MCV 95 04/12/2011 1634   NEUTROABS 14.8* 02/14/2015 2213   NEUTROABS 6.3 05/24/2014 1424   LYMPHSABS 1.1 02/14/2015 2213   LYMPHSABS 2.1 05/24/2014 1424   MONOABS 1.7* 02/14/2015 2213   MONOABS 0.9 05/24/2014 1424   EOSABS 0.0 02/14/2015 2213   EOSABS 0.3 05/24/2014 1424   BASOSABS 0.0 02/14/2015 2213   BASOSABS 0.0 05/24/2014 1424   Comprehensive Metabolic Panel:    Component Value Date/Time   NA 140 02/17/2015 0528   NA 142 10/18/2014   NA 140 05/24/2014 1425   NA 144 04/12/2011 1634   K 4.7 02/17/2015 0528   K 5.0 05/24/2014 1425   K 4.1 04/12/2011 1634   CL 110 02/17/2015 0528   CL 104 04/12/2011 1634   CO2 22 02/17/2015 0528   CO2 27 05/24/2014 1425   CO2 31 04/12/2011 1634   BUN 36* 02/17/2015 0528   BUN 21 10/18/2014   BUN 17.6 05/24/2014 1425   BUN 16 04/12/2011 1634   CREATININE 0.77 02/17/2015 0528   CREATININE 0.9 10/18/2014   CREATININE 0.9 05/24/2014  1425   CREATININE 0.97 04/12/2011 1634   GLUCOSE 192* 02/17/2015 0528   GLUCOSE 118 05/24/2014 1425   GLUCOSE 159* 04/12/2011 1634   CALCIUM 7.5* 02/17/2015 0528   CALCIUM 9.5 05/24/2014 1425   CALCIUM 8.9 04/12/2011 1634   AST 19 02/17/2015 0528   AST 12 05/24/2014 1425   AST 23 04/12/2011 1634   ALT 17 02/17/2015 0528   ALT 8 05/24/2014 1425   ALT 18 04/12/2011 1634   ALKPHOS 61 02/17/2015 0528   ALKPHOS 70 05/24/2014 1425   ALKPHOS 84 04/12/2011 1634   BILITOT 0.5 02/17/2015 0528   BILITOT 0.41 05/24/2014 1425   BILITOT 0.4 04/12/2011 1634   PROT 4.4* 02/17/2015 0528   PROT 7.5 05/24/2014 1425   PROT 7.7 04/12/2011 1634   ALBUMIN 1.7* 02/17/2015 0528   ALBUMIN 3.6 05/24/2014 1425   ALBUMIN 3.8 04/12/2011 1634   Discussed with Dr Allyson Sabal  Time In: 1230 Time Out:  1345 Time Total: 75 min Greater than 50%  of this time was spent counseling and coordinating care related to the above assessment and plan.  Signed by: Taylor Lessen, NP  Knox Royalty, NP  02/17/2015, 12:44 PM  Please contact Palliative Medicine Team phone at [phone] for questions and concerns.

## 2015-02-17 NOTE — Progress Notes (Signed)
PT Cancellation Note  Patient Details Name: JOSHUWA VECCHIO MRN: [id number] DOB: 01-Aug-1928   Cancelled Treatment:    Reason Eval/Treat Not Completed: Fatigue/lethargy limiting ability to participate.  Pt c/o feeling too weak and fatigued at this time.  Will f/u as appropriate.     Shardee Dieu, Thornton Papas 02/17/2015, 9:05 AM

## 2015-02-17 NOTE — Care Management Important Message (Signed)
Important Message  Patient Details  Name: Taylor [name] MRN: [id number] Date of Birth: 06-09-1928   Medicare Important Message Given:  Yes    Bethena Roys, RN 02/17/2015, 3:21 PM

## 2015-02-17 NOTE — Progress Notes (Signed)
OT Cancellation Note  Patient Details Name: Taylor [name] MRN: [id number] DOB: [birth year]   Cancelled Treatment:    Reason Eval/Treat Not Completed: Other (comment) (Family declined). Upon entering room, patient's granddaughter stopped therapist and stated that I needed to talk to patient's daughter before working with him. Patient's daughter adamant that therapist did not work with patient, encouraged daughter to ask patient. She walked in room, looked at her dad, and shook head "no". Then, without talking to patient proceeded to say "It's not a good idea right now". Patient and family need to be educated on role of therapy and importance of patient getting OOB. Will check back as able and schedule allows for OT eval. Thank you for the order.   Chrys Racer , MS, OTR/L, CLT Pager: [phone]  02/17/2015, 12:53 PM

## 2015-02-17 NOTE — Progress Notes (Signed)
The patient's saturation was in the 80's and now it is at 100% on 5 liters of [place].  His MAP is at 56.  At this time his respiratory and overall clinical status is too poor to undergo evaluation with an EGD.  The risk far outweighs the benefit.  I cancelled the EGD.  I recommend treating with PPI QD.  If his respiratory status improves, I will reconsider performing the procedure.  Signing off.  Please call with any questions.

## 2015-02-18 DIAGNOSIS — J189 Pneumonia, unspecified organism: Secondary | ICD-10-CM

## 2015-02-18 LAB — GLUCOSE, CAPILLARY
GLUCOSE-CAPILLARY: 146 mg/dL — AB (ref 65–99)
GLUCOSE-CAPILLARY: 167 mg/dL — AB (ref 65–99)
GLUCOSE-CAPILLARY: 164 mg/dL — AB (ref 65–99)
GLUCOSE-CAPILLARY: 168 mg/dL — AB (ref 65–99)

## 2015-02-18 LAB — CBC
HEMATOCRIT: 21.2 % — AB (ref 39.0–52.0)
Hemoglobin: 7.1 g/dL — ABNORMAL LOW (ref 13.0–17.0)
MCH: 29.6 pg (ref 26.0–34.0)
MCHC: 33.5 g/dL (ref 30.0–36.0)
MCV: 88.3 fL (ref 78.0–100.0)
PLATELETS: 126 10*3/uL — AB (ref 150–400)
RBC: 2.4 MIL/uL — AB (ref 4.22–5.81)
RDW: 16 % — ABNORMAL HIGH (ref 11.5–15.5)
WBC: 18.8 10*3/uL — AB (ref 4.0–10.5)

## 2015-02-18 LAB — COMPREHENSIVE METABOLIC PANEL
ALT: 19 U/L (ref 17–63)
AST: 22 U/L (ref 15–41)
Albumin: 1.6 g/dL — ABNORMAL LOW (ref 3.5–5.0)
Alkaline Phosphatase: 62 U/L (ref 38–126)
Anion gap: 6 (ref 5–15)
BUN: 19 mg/dL (ref 6–20)
CO2: 22 mmol/L (ref 22–32)
Calcium: 7.3 mg/dL — ABNORMAL LOW (ref 8.9–10.3)
Chloride: 110 mmol/L (ref 101–111)
Creatinine, Ser: 0.67 mg/dL (ref 0.61–1.24)
GFR calc Af Amer: >60 mL/min (ref >60)
GFR calc non Af Amer: >60 mL/min (ref >60)
Glucose, Bld: 176 mg/dL — ABNORMAL HIGH (ref 65–99)
Potassium: 4.4 mmol/L (ref 3.5–5.1)
Sodium: 138 mmol/L (ref 135–145)
Total Bilirubin: 0.6 mg/dL (ref 0.3–1.2)
Total Protein: 4.7 g/dL — ABNORMAL LOW (ref 6.5–8.1)

## 2015-02-18 LAB — TYPE AND SCREEN
ABO/RH(D): A POS
Antibody Screen: NEGATIVE

## 2015-02-18 LAB — PREPARE RBC (CROSSMATCH)

## 2015-02-18 MED ORDER — SODIUM CHLORIDE 0.9 % IV SOLN: Freq: Once | INTRAVENOUS | Status: DC

## 2015-02-18 NOTE — Progress Notes (Signed)
Pharmacy Antibiotic Follow-up Note  Taylor [name] is a 82 y.o. year-old male admitted on 02/14/2015.  The patient is currently on day #5 of Vancomycin and Cefepime for sepsis coverage.  Day # 2 Flagyl IV added for aspiration pneumonia coverage  Assessment/Plan:  Afebrile, WBC dpwm tp 18.8. Lactic acid 2.86>1.6 on 1/10.  On Protonix drip for GI bleed.  Continue Cefepime 2gm IV q12hrs.  Continue Vancomycin 1gm IV q12hrs.  Vanc trough planned for today, but unable to obtain due to transfusions and timing of blood samples around transfusions.  Will consider re-ordering in a few days.  Follow renal function, progress.  Temp (24hrs), Avg:98.1 F (36.7 C), Min:97.2 F (36.2 C), Max:98.5 F (36.9 C)   Recent Labs Lab 02/15/15 0645 02/15/15 2339 02/16/15 1010 02/17/15 0528 02/18/15 0452  WBC 16.0* 24.5* 25.0* 21.3* 18.8*     Recent Labs Lab 02/14/15 2213 02/15/15 0645 02/16/15 0725 02/17/15 0528 02/18/15 0452  CREATININE 1.31* 0.92 0.92 0.77 0.67   Estimated Creatinine Clearance: 76.9 mL/min (by C-G formula based on Cr of 0.67).    Allergies  Allergen Reactions  . Amoxicillin Palpitations  . Motrin [Ibuprofen] Palpitations  . Chantix [Varenicline Tartrate] Other (See Comments)    depression    Antimicrobials this admission: Cefepime 1/10 >> Vancomycin 1/10>> Flagyl IV 1/12>> CHG/Bactroban 1/12>> (1/16)  Levels/dose changes this admission:  none yet. Intended 1/14 but unable to obtain due to requiring transfusions   Microbiology results: 1/6 Blood cx x 2 - neg 1/10 Blood cx x 2 - ng x 4 days so far 1/10 Urine (repeat 2212) - neg 1/10 urine (1841)- insignificant growth 1/7 MRSA screen POS   Arty Baumgartner, [place] Pager: [phone] 02/18/2015 3:32 PM

## 2015-02-18 NOTE — Evaluation (Signed)
Clinical/Bedside Swallow Evaluation Patient Details  Name: Taylor [name] MRN: [id number] Date of Birth: 06-05-1928  Today's Date: 02/18/2015 Time: SLP Start Time (ACUTE ONLY): 1000 SLP Stop Time (ACUTE ONLY): 1023 (was 1041 --15 minutes MD in room with pt/family) SLP Time Calculation (min) (ACUTE ONLY): 23 min  Past Medical History:  Past Medical History  Diagnosis Date  . Hypertension   . Hyperlipidemia   . Hypothyroidism   . COPD (chronic obstructive pulmonary disease) ([place])   . Atrial fibrillation ([place])   . Glaucoma   . Anxiety   . History of bladder cancer   . Lung nodule     SMALL  . OSA (obstructive sleep apnea)   . Heart murmur   . Dysrhythmia     Hx of Atrial Flutter  . Deep vein blood clot of left lower extremity ([place])   . On home oxygen therapy     At night, during the day PRN 2.5L  . Cancer ([place])     bladder cancer, skin cancer  . Diabetes mellitus without complication (HCC)     Type 2  . Depression   . Arthritis   . Osteoporosis   . Pneumonia 02/2015  . Kidney stones 2016   Past Surgical History:  Past Surgical History  Procedure Laterality Date  . Cardiovascular stress test  12/25/2007    EF 64%. NO EVIDENCE OF ISCHEMIA  . Cystoscopy N/A 12/15  . Varicose vein surgery      at 82 years old first, and about 20 years ago  . Back surgery  10-18-11    [facility]  . Tonsillectomy    . Eye surgery Bilateral     cataracts  . Multiple tooth extractions    . Total knee arthroplasty Left 10/11/2014    Procedure: TOTAL KNEE ARTHROPLASTY;  Surgeon: Renette Butters, MD;  Location: [facility];  Service: Orthopedics;  Laterality: Left;  [facility] surgery     HPI:  82 year old male with a history of paroxysmal atrial fibrillation on xarelto, COPD, on nocturnal oxygen 2.5 L, diabetes mellitus, hypertension, recently admitted 1/6-1/8 for community-acquired pneumonia, left lower lobe, discharged home on levofloxacin for 7 days, who presents with weakness,  persistent nausea, syncopal episode at home, and the patient remembers hitting the back of his head. Patient was found on the floor by his family, daughter brings him in states that patient has been weak and bed bound since his discharge. Due to nausea the patient has not been eating or drinking. No diarrhea, continues to have pleuritic chest pain when he coughs. Denies any headache, blurry vision, slurred speech. Patient has a history of chronic low back pain, followed by Dr. [name], had an MRI 2 weeks ago, was found to have lesions concerning for metastatic disease.  Pt was for EGD and hemoglobin is low but pt unable to have EGD due to respiratory issues per note.  Pt is on antibiotics for pneumonia = x 7 days.   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     Assessment / Plan / Recommendation Clinical Impression  Pt presents with negative CN exam and functional oropharyngeal swallow based on clinical swallow testing.  No clinical indications of aspiration, dysphagia or airway compromise with all intake observed.  Pt does admit to occasional issues with swallowing large pills and sensing lodging in esophagus = he states this clears with more liquids.  SLP advised pt to consider using warm liquids if note problems.  There  was no increased work of breathing with intake and protective exhalation noted after swallowing.  Recommend continue regular/thin diet.  Provided pt and granddaughter *daughter was talking to nurse* with information re: xerostomia compensations.  No furhter SLP indicated, thanks for this consult.     Aspiration Risk  No limitations    Diet Recommendation Regular;Thin liquid   Liquid Administration via: Cup;Straw Medication Administration: Whole meds with liquid Supervision: Patient able to self feed Compensations: Slow rate;Small sips/bites Postural Changes: Remain upright for at least 30 minutes after po intake;Seated upright at 90 degrees    Other   Recommendations Recommended Consults: Other (Comment) (note plan for EGD to locate source of bleeding per md note) Oral Care Recommendations: Oral care BID   Follow up Recommendations       Frequency and Duration            Prognosis        Swallow Study   General Date of Onset: 02/18/15 HPI: 82 year old male with a history of paroxysmal atrial fibrillation on xarelto, COPD, on nocturnal oxygen 2.5 L, diabetes mellitus, hypertension, recently admitted 1/6-1/8 for community-acquired pneumonia, left lower lobe, discharged home on levofloxacin for 7 days, who presents with weakness, persistent nausea, syncopal episode at home, and the patient remembers hitting the back of his head. Patient was found on the floor by his family, daughter brings him in states that patient has been weak and bed bound since his discharge. Due to nausea the patient has not been eating or drinking. No diarrhea, continues to have pleuritic chest pain when he coughs. Denies any headache, blurry vision, slurred speech. Patient has a history of chronic low back pain, followed by Dr. [name], had an MRI 2 weeks ago, was found to have lesions concerning for metastatic disease.  Pt was for EGD and hemoglobin is low but pt unable to have EGD due to respiratory issues per note.  Pt is on antibiotics for pneumonia = x 7 days.   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   Type of Study: Bedside Swallow Evaluation Diet Prior to this Study: Regular;Thin liquids Temperature Spikes Noted: No Respiratory Status: Nasal cannula (1 liter) History of Recent Intubation: No Behavior/Cognition: Alert;Cooperative;Pleasant mood Oral Cavity Assessment: Within Functional Limits Oral Care Completed by SLP: No Oral Cavity - Dentition: Adequate natural dentition Vision: Functional for self-feeding Self-Feeding Abilities: Able to feed self Patient Positioning: Upright in bed Baseline Vocal Quality: Normal Volitional Cough:  Strong Volitional Swallow: Able to elicit    Oral/Motor/Sensory Function Overall Oral Motor/Sensory Function: Within functional limits   Ice Chips Ice chips: Not tested   Thin Liquid Thin Liquid: Within functional limits Presentation: Straw;Self Fed    Nectar Thick Nectar Thick Liquid: Not tested   Honey Thick Honey Thick Liquid: Not tested   Puree Puree: Within functional limits Presentation: Self Fed;Spoon   Solid   GO   Solid: Within functional limits Presentation: Petronila, [facility] SLP [phone]

## 2015-02-18 NOTE — Progress Notes (Signed)
[name] PROGRESS NOTE  Taylor [name] HAL:[id number] DOB: 1928/05/19 DOA: 02/14/2015 PCP: [name], MD  Length of stay: 4   Assessment/Plan: Principal Problem:   Sepsis ([place]) Active Problems:   Type 2 diabetes mellitus ([place])   Hypothyroidism   Hypertension   Hyperlipidemia   History of depression   COPD with acute exacerbation ([place])   Low back pain radiating to left leg    HPI:  82 year old male with a history of paroxysmal atrial fibrillation on xarelto, COPD, on nocturnal oxygen 2.5 L, diabetes mellitus, hypertension, recently admitted 1/6-1/8 for community-acquired pneumonia, left lower lobe, discharged home on levofloxacin for 7 days, who presents with weakness, persistent nausea, syncopal episode at home, and the patient remembers hitting the back of his head. Patient was found on the floor by his family, daughter brings him in states that patient has been weak and bed bound since his discharge. Due to nausea the patient has not been eating or drinking.   Patient has a history of chronic low back pain, followed by Dr. [name], had an MRI 2 weeks ago, was found to have lesions concerning for metastatic disease and was referred to Dr. [name] for further evaluation. Patient has been evaluated by oncology Dr. [name] during this admission. He had a face-to-face meeting with the family on 1/14.  On arrival systolic blood pressure was 64 /34 in the ER on 1/10. Sepsis protocol was initiated. White count appears to be persistently high at 17.9. Hemoglobin has dropped from 10.4>9.2. Creatinine has increased from 0.7>2.03. Lactate 2.86. GI was consulted because the patient was noted to have 2 episodes of black tarry stools. Dr. [name] did not attempt EGD on 1/13 due to tenuous pulmonary status.   Assessment and plan Sepsis with hypovolemic shock, hemorrhagic shock  Resolved  Likely in the setting of recent left lower lobe pneumonia with incomplete  resolution  ,ongoing bleeding  White count still elevated, lactate on admit 2.86 >1.6, creatinine 0.71> on admit 2.03>0.92  , Pro calcitonin <0.10 Compliant with Levaquin at home, given poor oral intake and nausea the patient appears severely dehydrated Blood culture from 1/6 , 1/10 remain negative Currently on day #5 of vancomycin and cefepime for sepsis coverage Day #3 of Flagyl Discussed with Dr. [name] who recently performed MRI of the patient's lumbar spine and there was no evidence of discitis CT chest showed left lower lobe pneumonia    Syncope likely secondary to orthostatic hypotension in the setting of pneumonia/sepsis, GI bleeding, underlying malignancy, negative  troponin, 2-D echo [showed EF of 60-65%], doubt PE was getting xarelto prior to admission ,CT head negative    Community-acquired pneumonia (diagnosed 1/6) with underlying history of COPD, chronic respiratory failure Continue antibiotics as above - Previous Influenza panel negative, urine strep antigen negative.  -Previous Pro calcitonin less than 0.1, lactic acid 1.0. Lactate is now 2.86 - Previous Blood cultures remain negative to date.    Hyperkalemia  -Previously K+ 6.5 ,secondary to dehydration, resolved  Placed on low-dose of hydrochlorothiazide 12.5 mg daily prior to DC on 1/8 .   Severe COPD with chronic respiratory failure - Using 2.5 Lpm supplemental O2 at night while home  - Continue duo nebs, wean O2 as tolerated  Type II DM  - Holding home metformin while inpt  - Continue sliding scale insulin, hemoglobin A1c 7.0 Anion gap 11   Fall-given that the patient is on xarelto  CT head done to  rule out intracranial bleeding   Normocytic Anemia , likely now with acute blood loss anemia, malignancy  Hemoglobin was 10.8 on 1/6, dropped to 6.7 on 1/ 11,status  posttransfusion of 3 units of packed red blood cells Discontinued xarelto,  Hemoglobin continues to drop Patient to Receive another  2 units today  CT shows possible inflammation of the distal stomach and proximal duodenum,unable to do EGD CT shows multiple lytic and sclerotic lesions in the spine and ribs and pelvis suspicious for metastatic disease or multiple myeloma  Repeat  SPEP /UPEP,Pending, oncology following  CT scan findings include metastatic solid tumor malignancy such as prostate cancer, GI malignancy, among others. Multiple myeloma would be a possibility but less likely given his workup in the past did not reveal any clear-cut abnormalities.     Paroxysmal atrial fib  - Currently in sinus, , discontinued  Xarelto  Initially held diltiazem. Resumed due to tachycardia, hold parameters in place - CHADS vasc 2, patient was not on any anticoagulation prior to his left knee total arthroplasty and was placed on xarelto for DVT prophylaxis.   Discharged home on Xarelto after last admission   Hypothyroidism Continue Synthroid   Adrenal mass, on MRI of the lumbar spine as per Dr. Christia Reading MurphyMRI abdomen shows right adrenal gland benign adrenal myolipoma, hepatic steatosis, gallstones, T12 bone lesion suspicious for metastatic disease, T9 compression fracture  Followed by Dr. Larinda Buttery  Urology and Dr [name]     DVT prophylaxsis Xarelto  Code Status:      Code Status Orders        Start     Ordered   02/14/15 1720  Full code   Continuous     02/14/15 1721    Resolved   Family Communication: Discussed in detail with the patient, all imaging results, lab results explained to the patient   Disposition Plan:  Continue stepdown, updated daughtertwice a day at  [phone], Ola Spurr, Face-to-face meeting again today on 1/14, with hospitalist and with oncologist    Consultants:  None  Procedures:  None  Antibiotics: Anti-infectives    Start     Dose/Rate Route Frequency Ordered Stop   02/16/15 0900  metroNIDAZOLE (FLAGYL) IVPB 500 mg     500 mg 100 mL/hr over 60 Minutes Intravenous  Every 8 hours 02/16/15 0831     02/15/15 0400  vancomycin (VANCOCIN) IVPB 1000 mg/200 mL premix     1,000 mg 200 mL/hr over 60 Minutes Intravenous Every 12 hours 02/14/15 1457     02/15/15 0330  ceFEPIme (MAXIPIME) 2 g in dextrose 5 % 50 mL IVPB     2 g 100 mL/hr over 30 Minutes Intravenous Every 12 hours 02/14/15 1457     02/14/15 1530  vancomycin (VANCOCIN) 1,750 mg in sodium chloride 0.9 % 500 mL IVPB     1,750 mg 250 mL/hr over 120 Minutes Intravenous  Once 02/14/15 1449 02/14/15 1917   02/14/15 1500  ceFEPIme (MAXIPIME) 2 g in dextrose 5 % 50 mL IVPB     2 g 100 mL/hr over 30 Minutes Intravenous  Once 02/14/15 1446 02/14/15 1613   02/14/15 1500  vancomycin (VANCOCIN) IVPB 1000 mg/200 mL premix  Status:  Discontinued     1,000 mg 200 mL/hr over 60 Minutes Intravenous  Once 02/14/15 1446 02/14/15 1449         HPI/Subjective: Tachycardia improved, afebrile, no further episodes of black tarry stools but hemoglobin has dropped again   Objective: Filed Vitals:  02/18/15 0515 02/18/15 0622 02/18/15 0821 02/18/15 0856  BP:  127/60 127/63 121/63  Pulse:   105 108  Temp: 98.2 F (36.8 C)  98.4 F (36.9 C) 98.2 F (36.8 C)  TempSrc: Oral  Oral Oral  Resp:   21 17  Height:      Weight:  98.521 kg (217 lb 3.2 oz)    SpO2:   95% 95%    Intake/Output Summary (Last 24 hours) at 02/18/15 0902 Last data filed at 02/18/15 0600  Gross per 24 hour  Intake   4040 ml  Output   1575 ml  Net   2465 ml    Exam:  General: No acute respiratory distress Lungs: Clear to auscultation bilaterally without wheezes or crackles Cardiovascular: Regular rate and rhythm without murmur gallop or rub normal S1 and S2 Abdomen: Nontender, nondistended, soft, bowel sounds positive, no rebound, no ascites, no appreciable mass Extremities: No significant cyanosis, clubbing, or edema bilateral lower extremities     Data Review   Micro Results Recent Results (from the past 240 hour(s))   Culture, blood (routine x 2) Call MD if unable to obtain prior to antibiotics being given     Status: None   Collection Time: 02/10/15  9:55 PM  Result Value Ref Range Status   Specimen Description BLOOD LEFT ANTECUBITAL  Final   Special Requests BOTTLES DRAWN AEROBIC AND ANAEROBIC 5CC  Final   Culture NO GROWTH 5 DAYS  Final   Report Status 02/15/2015 FINAL  Final  Culture, blood (routine x 2) Call MD if unable to obtain prior to antibiotics being given     Status: None   Collection Time: 02/10/15 10:02 PM  Result Value Ref Range Status   Specimen Description BLOOD RIGHT ARM  Final   Special Requests IN PEDIATRIC BOTTLE [place]  Final   Culture NO GROWTH 5 DAYS  Final   Report Status 02/15/2015 FINAL  Final  MRSA PCR Screening     Status: Abnormal   Collection Time: 02/11/15  4:48 AM  Result Value Ref Range Status   MRSA by PCR POSITIVE (A) NEGATIVE Final    Comment:        The GeneXpert MRSA Assay (FDA approved for NASAL specimens only), is one component of a comprehensive MRSA colonization surveillance program. It is not intended to diagnose MRSA infection nor to guide or monitor treatment for MRSA infections. RESULT CALLED TO, READ BACK BY AND VERIFIED WITH: R HANEY _0  02/05/15 MKELLY   Blood Culture (routine x 2)     Status: None (Preliminary result)   Collection Time: 02/14/15  3:05 PM  Result Value Ref Range Status   Specimen Description BLOOD LEFT HAND  Final   Special Requests BOTTLES DRAWN AEROBIC AND ANAEROBIC 4CC  Final   Culture NO GROWTH 3 DAYS  Final   Report Status PENDING  Incomplete  Blood Culture (routine x 2)     Status: None (Preliminary result)   Collection Time: 02/14/15  3:30 PM  Result Value Ref Range Status   Specimen Description BLOOD LEFT WRIST  Final   Special Requests BOTTLES DRAWN AEROBIC AND ANAEROBIC 5CC  Final   Culture NO GROWTH 3 DAYS  Final   Report Status PENDING  Incomplete  Urine culture     Status: None   Collection Time:  02/14/15  6:41 PM  Result Value Ref Range Status   Specimen Description URINE, RANDOM  Final   Special Requests NONE  Final   Culture  3,000 COLONIES/mL INSIGNIFICANT GROWTH  Final   Report Status 02/15/2015 FINAL  Final  Urine culture     Status: None   Collection Time: 02/14/15 10:12 PM  Result Value Ref Range Status   Specimen Description URINE, RANDOM  Final   Special Requests NONE  Final   Culture NO GROWTH 2 DAYS  Final   Report Status 02/16/2015 FINAL  Final  Urine culture     Status: None   Collection Time: 02/16/15  7:20 AM  Result Value Ref Range Status   Specimen Description URINE, RANDOM  Final   Special Requests NONE  Final   Culture NO GROWTH 1 DAY  Final   Report Status 02/17/2015 FINAL  Final    Radiology Reports Ct Abdomen Pelvis Wo Contrast  02/16/2015  CLINICAL DATA:  Anemia, leukocytosis and tarry stools. Recent admission for sepsis. History of COPD, diabetes and hypertension. History of bladder cancer and plasma cell disorder. EXAM: CT CHEST, ABDOMEN AND PELVIS WITHOUT CONTRAST TECHNIQUE: Multidetector CT imaging of the chest, abdomen and pelvis was performed following the standard protocol without IV contrast. COMPARISON:  Chest CT 12/15/2007, abdominal pelvic CT 06/30/2014 and chest radiographs 02/10/2015. Skeletal survey 05/24/2014. FINDINGS: CT CHEST Mediastinum/Nodes: There are no enlarged mediastinal, hilar or axillary lymph nodes. Small mediastinal lymph nodes are stable. The thyroid gland, trachea and esophagus demonstrate no significant findings. The heart size is normal. There is no pericardial effusion. There is extensive atherosclerosis of the aorta, great vessels and coronary arteries. Lungs/Pleura: There is minimal dependent pleural fluid bilaterally. As demonstrated on recent radiographs, there is airspace disease with consolidation and air bronchograms in the left lower lobe. There is minimal dependent atelectasis at the right lung base. A 6 mm left upper  lobe nodule on image number 16 is stable. There are no new or enlarging pulmonary nodules. Mild emphysematous changes are present. Musculoskeletal/Chest wall: There is a T9 compression fracture which appears new and likely pathologic. There are multiple irregular lucent and sclerotic lesions throughout the thoracic spine and ribs. Small purely lytic lesion noted anteriorly in the left third rib (image 21). CT ABDOMEN AND PELVIS FINDINGS Hepatobiliary: As evaluated in the noncontrast state, the liver appears stable without suspicious findings. Small calcified gallstones are again noted. There is no gallbladder wall thickening or significant biliary dilatation. Pancreas: Unremarkable. No pancreatic ductal dilatation or surrounding inflammatory changes. Spleen: Normal in size without focal abnormality. Adrenals/Urinary Tract: Chronic right adrenal mass appears unchanged, measuring 3.4 x 3.1 cm on image 51. This contains calcifications, soft tissue and fatty components. 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 The left adrenal gland appears normal. Multiple bilateral renal cysts are again noted, measuring up to 6.0 cm in the lower pole of the right kidney and up to 5.8 cm in the interpolar region of the left kidney. Both kidneys demonstrate mild cortical thinning. There is no evidence of urinary tract calculus or hydronephrosis. The bladder appears normal. Stomach/Bowel: There appears to be mild soft tissue stranding around the distal stomach, proximal duodenum and pancreatic head. This is suboptimally evaluated due to the lack of intravenous and oral contrast. There is no extraluminal fluid collection. No evidence of bowel wall thickening, distention or surrounding inflammatory change. Stable diverticular changes of the sigmoid colon. Vascular/Lymphatic: There are no enlarged abdominal or pelvic lymph nodes. Stable extensive atherosclerosis of the aorta, its branches and the  iliac arteries. There is stable dilatation of the right common iliac artery. Reproductive: Unremarkable.  Other: Stable asymmetric fat in the left inguinal canal and stable small umbilical hernia containing only fat. No evidence of ascites. Musculoskeletal: There are several new mixed lytic and sclerotic lesions within the lumbar spine. There is a dominant 2.2 cm sclerotic lesion in the S1 segment. Postsurgical changes are noted in the lower lumbar spine. There is no evidence of lumbar pathologic fracture or epidural tumor. IMPRESSION: 1. Possible inflammation involving the distal stomach and proximal duodenum in this patient with tarry stools. This is suboptimally evaluated by this examination. Consider endoscopic correlation. 2. The remainder of the small bowel and colon demonstrate no significant findings. There is distal colonic diverticulosis. 3. Left lower lobe pneumonia.  Small bilateral pleural effusions. 4. Multiple lytic and sclerotic lesions in the spine, ribs and pelvis, suspicious for metastatic disease or multiple myeloma. Correlate clinically. Probable pathologic fracture at T9. 5. Stable additional incidental findings including extensive atherosclerosis, cholelithiasis and a chronic right adrenal mass. Electronically Signed   By: Richardean Sale M.D.   On: 02/16/2015 09:45   Dg Chest 2 View  02/10/2015  CLINICAL DATA:  Left-sided chest pain in the mornings for the last few days. EXAM: CHEST  2 VIEW COMPARISON:  04/12/2011 FINDINGS: Heart size is normal. There is atherosclerosis of the aorta. The right lung is clear. There is abnormal density in the left lower lobe posteriorly most consistent with bronchopneumonia. Follow-up to clearing recommended to ensure there is not a mass lesion. No effusion. No acute bone finding. IMPRESSION: Left lower lobe infiltrate most consistent with bronchopneumonia. Follow-up to clearing to rule out underlying mass. Electronically Signed   By: Nelson Chimes M.D.   On:  02/10/2015 15:12   Ct Head Wo Contrast  02/14/2015  CLINICAL DATA:  Syncope and headache. EXAM: CT HEAD WITHOUT CONTRAST TECHNIQUE: Contiguous axial images were obtained from the base of the skull through the vertex without intravenous contrast. 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 Stable cortical atrophy. The brain demonstrates no evidence of hemorrhage, acute infarction, edema, mass effect, extra-axial fluid collection, hydrocephalus or mass lesion. The skull is unremarkable. IMPRESSION: No acute findings. 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 Electronically Signed   By: Aletta Edouard M.D.   On: 02/14/2015 17:51   Ct Chest Wo Contrast  02/16/2015  CLINICAL DATA:  Anemia, leukocytosis and tarry stools. Recent admission for sepsis. History of COPD, diabetes and hypertension. History of bladder cancer and plasma cell disorder. EXAM: CT CHEST, ABDOMEN AND PELVIS WITHOUT CONTRAST TECHNIQUE: Multidetector CT imaging of the chest, abdomen and pelvis was performed following the standard protocol without IV contrast. COMPARISON:  Chest CT 12/15/2007, abdominal pelvic CT 06/30/2014 and chest radiographs 02/10/2015. Skeletal survey 05/24/2014. FINDINGS: CT CHEST Mediastinum/Nodes: There are no enlarged mediastinal, hilar or axillary lymph nodes. Small mediastinal lymph nodes are stable. The thyroid gland, trachea and esophagus demonstrate no significant findings. The heart size is normal. There is no pericardial effusion. There is extensive atherosclerosis of the aorta, great vessels and coronary arteries. Lungs/Pleura: There is minimal dependent pleural fluid bilaterally. As demonstrated on recent radiographs, there is airspace disease with consolidation and air bronchograms in the left lower lobe. There is minimal dependent atelectasis at the right lung base. A 6 mm left upper lobe nodule on  image number 16 is stable. There are no new or enlarging pulmonary nodules. Mild emphysematous changes are present. Musculoskeletal/Chest wall: There is  a T9 compression fracture which appears new and likely pathologic. There are multiple irregular lucent and sclerotic lesions throughout the thoracic spine and ribs. Small purely lytic lesion noted anteriorly in the left third rib (image 21). CT ABDOMEN AND PELVIS FINDINGS Hepatobiliary: As evaluated in the noncontrast state, the liver appears stable without suspicious findings. Small calcified gallstones are again noted. There is no gallbladder wall thickening or significant biliary dilatation. Pancreas: Unremarkable. No pancreatic ductal dilatation or surrounding inflammatory changes. Spleen: Normal in size without focal abnormality. Adrenals/Urinary Tract: Chronic right adrenal mass appears unchanged, measuring 3.4 x 3.1 cm on image 51. This contains calcifications, soft tissue and fatty components. 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 The left adrenal gland appears normal. Multiple bilateral renal cysts are again noted, measuring up to 6.0 cm in the lower pole of the right kidney and up to 5.8 cm in the interpolar region of the left kidney. Both kidneys demonstrate mild cortical thinning. There is no evidence of urinary tract calculus or hydronephrosis. The bladder appears normal. Stomach/Bowel: There appears to be mild soft tissue stranding around the distal stomach, proximal duodenum and pancreatic head. This is suboptimally evaluated due to the lack of intravenous and oral contrast. There is no extraluminal fluid collection. No evidence of bowel wall thickening, distention or surrounding inflammatory change. Stable diverticular changes of the sigmoid colon. Vascular/Lymphatic: There are no enlarged abdominal or pelvic lymph nodes. Stable extensive atherosclerosis of the aorta, its branches and the iliac arteries.  There is stable dilatation of the right common iliac artery. Reproductive: Unremarkable. Other: Stable asymmetric fat in the left inguinal canal and stable small umbilical hernia containing only fat. No evidence of ascites. Musculoskeletal: There are several new mixed lytic and sclerotic lesions within the lumbar spine. There is a dominant 2.2 cm sclerotic lesion in the S1 segment. Postsurgical changes are noted in the lower lumbar spine. There is no evidence of lumbar pathologic fracture or epidural tumor. IMPRESSION: 1. Possible inflammation involving the distal stomach and proximal duodenum in this patient with tarry stools. This is suboptimally evaluated by this examination. Consider endoscopic correlation. 2. The remainder of the small bowel and colon demonstrate no significant findings. There is distal colonic diverticulosis. 3. Left lower lobe pneumonia.  Small bilateral pleural effusions. 4. Multiple lytic and sclerotic lesions in the spine, ribs and pelvis, suspicious for metastatic disease or multiple myeloma. Correlate clinically. Probable pathologic fracture at T9. 5. Stable additional incidental findings including extensive atherosclerosis, cholelithiasis and a chronic right adrenal mass. Electronically Signed   By: Richardean Sale M.D.   On: 02/16/2015 09:45   Mr Abdomen Wo Contrast  02/17/2015  CLINICAL DATA:  Evaluate adrenal mass EXAM: MRI ABDOMEN WITHOUT CONTRAST TECHNIQUE: Multiplanar multisequence MR imaging was performed without the administration of intravenous contrast. COMPARISON:  02/16/2015 FINDINGS: Lower chest: Small left effusion and left lower lobe airspace consolidation is noted. Hepatobiliary: Mild hepatic steatosis. There is no focal liver abnormality. Stones are identified within the gallbladder. These measure up to 6 mm. No biliary dilatation. The common bile duct has a normal caliber. Pancreas: Pancreas is unremarkable. Spleen: Negative Adrenals/Urinary Tract: Fat signal  intensity within the 2.7 cm right adrenal mass is identified compatible with benign adrenal myelolipoma. Normal appearance of the left adrenal gland. Multiple bilateral renal cysts identified. The largest arises from the upper pole the right kidney measuring 6.3 cm. 5.5 cm cyst arises from the midpole of left kidney. No obstructive uropathy. Stomach/Bowel:  The stomach is normal. The visualized upper abdominal bowel loops have a normal caliber. Vascular/Lymphatic: Aortic atherosclerosis noted. No aneurysm. No upper abdominal adenopathy identified. Other: No ascites or focal fluid collections identified within the upper abdomen. Musculoskeletal: Inferior endplate lesion at the T9 level is again identified. Lesion involving the posterior aspect of the T12 vertebra and right posterior element is again identified suspicious for metastatic disease, image number 15 of series 4. IMPRESSION: 1. 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 2. Mild hepatic steatosis. 3. Gallstones 4. Aortic atherosclerosis 5. T12 bone lesions suspicious for metastatic disease. 6. T9 compression fracture. Electronically Signed   By: Kerby Moors M.D.   On: 02/17/2015 07:39   Dg Chest Port 1 View  02/14/2015  CLINICAL DATA:  Pneumonia. EXAM: PORTABLE CHEST 1 VIEW COMPARISON:  02/10/2015 FINDINGS: Left lower lobe infiltrate again identified. This may be slightly smaller and less dense compared to the prior chest x-ray. No edema or pleural fluid. The heart size and mediastinal contours are normal. IMPRESSION: Slightly smaller and less dense appearing left lower lobe pneumonia. Electronically Signed   By: Aletta Edouard M.D.   On: 02/14/2015 15:18     CBC  Recent Labs Lab 02/14/15 1530  02/14/15 2213 02/15/15 0645 02/15/15 2339 02/16/15 0725 02/16/15 1010 02/16/15 2106 02/17/15 0528 02/18/15 0452  WBC 17.9*  --  17.5* 16.0* 24.5*  --  25.0*  --  21.3* 18.8*  HGB 8.4*  < > 8.6* 7.6*  6.7* 7.2* 6.6* 8.5* 8.0* 7.1*  HCT 27.9*  < > 28.6* 25.3* 21.5* 22.9* 20.3* 25.2* 24.0* 21.2*  PLT 236  --  204 210 237  --  198  --  169 126*  MCV 90.9  --  90.8 90.7 91.1  --  87.1  --  87.9 88.3  MCH 27.4  --  27.3 27.2 28.4  --  28.3  --  29.3 29.6  MCHC 30.1  --  30.1 30.0 31.2  --  32.5  --  33.3 33.5  RDW 15.0  --  15.0 15.1 15.0  --  15.8*  --  15.6* 16.0*  LYMPHSABS 1.3  --  1.1  --   --   --   --   --   --   --   MONOABS 2.0*  --  1.7*  --   --   --   --   --   --   --   EOSABS 0.1  --  0.0  --   --   --   --   --   --   --   BASOSABS 0.0  --  0.0  --   --   --   --   --   --   --   < > = values in this interval not displayed.  Chemistries   Recent Labs Lab 02/14/15 1530  02/14/15 2213 02/15/15 0645 02/16/15 0725 02/17/15 0528 02/18/15 0452  NA 136  < > 140 138 137 140 138  K 4.9  < > 4.2 4.3 5.0 4.7 4.4  CL 99*  < > 105 107 109 110 110  CO2 26  --  _0 GLUCOSE 120*  < > 117* 159* 224* 192* 176*  BUN 59*  < > 43* 34* 50* 36* 19  CREATININE 2.03*  < > 1.31* 0.92 0.92 0.77 0.67  CALCIUM 8.0*  --  7.4* 7.4* 7.4* 7.5* 7.3*  MG  --   --  1.7  --   --   --   --   AST 21  --  22 17  --  19 22  ALT 21  --  19 17  --  17 19  ALKPHOS 70  --  78 65  --  61 62  BILITOT 0.3  --  0.7 0.5  --  0.5 0.6  < > = values in this interval not displayed. ------------------------------------------------------------------------------------------------------------------ estimated creatinine clearance is 76.9 mL/min (by C-G formula based on Cr of 0.67). ------------------------------------------------------------------------------------------------------------------ No results for input(s): HGBA1C in the last 72 hours. ------------------------------------------------------------------------------------------------------------------ No results for input(s): CHOL, HDL, LDLCALC, TRIG, CHOLHDL, LDLDIRECT in the last 72  hours. ------------------------------------------------------------------------------------------------------------------ No results for input(s): TSH, T4TOTAL, T3FREE, THYROIDAB in the last 72 hours.  Invalid input(s): FREET3 ------------------------------------------------------------------------------------------------------------------ No results for input(s): VITAMINB12, FOLATE, FERRITIN, TIBC, IRON, RETICCTPCT in the last 72 hours.  Coagulation profile  Recent Labs Lab 02/14/15 2213  INR 1.42    No results for input(s): DDIMER in the last 72 hours.  Cardiac Enzymes  Recent Labs Lab 02/14/15 2213 02/15/15 0645  TROPONINI <0.03  <0.03 <0.03   ------------------------------------------------------------------------------------------------------------------ Invalid input(s): POCBNP   CBG:  Recent Labs Lab 02/17/15 0730 02/17/15 1119 02/17/15 1618 02/17/15 2039 02/18/15 0822  GLUCAP 194* 123* 174* 166* 146*       Studies: Ct Abdomen Pelvis Wo Contrast  02/16/2015  CLINICAL DATA:  Anemia, leukocytosis and tarry stools. Recent admission for sepsis. History of COPD, diabetes and hypertension. History of bladder cancer and plasma cell disorder. EXAM: CT CHEST, ABDOMEN AND PELVIS WITHOUT CONTRAST TECHNIQUE: Multidetector CT imaging of the chest, abdomen and pelvis was performed following the standard protocol without IV contrast. COMPARISON:  Chest CT 12/15/2007, abdominal pelvic CT 06/30/2014 and chest radiographs 02/10/2015. Skeletal survey 05/24/2014. FINDINGS: CT CHEST Mediastinum/Nodes: There are no enlarged mediastinal, hilar or axillary lymph nodes. Small mediastinal lymph nodes are stable. The thyroid gland, trachea and esophagus demonstrate no significant findings. The heart size is normal. There is no pericardial effusion. There is extensive atherosclerosis of the aorta, great vessels and coronary arteries. Lungs/Pleura: There is minimal dependent pleural fluid  bilaterally. As demonstrated on recent radiographs, there is airspace disease with consolidation and air bronchograms in the left lower lobe. There is minimal dependent atelectasis at the right lung base. A 6 mm left upper lobe nodule on image number 16 is stable. There are no new or enlarging pulmonary nodules. Mild emphysematous changes are present. Musculoskeletal/Chest wall: There is a T9 compression fracture which appears new and likely pathologic. There are multiple irregular lucent and sclerotic lesions throughout the thoracic spine and ribs. Small purely lytic lesion noted anteriorly in the left third rib (image 21). CT ABDOMEN AND PELVIS FINDINGS Hepatobiliary: As evaluated in the noncontrast state, the liver appears stable without suspicious findings. Small calcified gallstones are again noted. There is no gallbladder wall thickening or significant biliary dilatation. Pancreas: Unremarkable. No pancreatic ductal dilatation or surrounding inflammatory changes. Spleen: Normal in size without focal abnormality. Adrenals/Urinary Tract: Chronic right adrenal mass appears unchanged, measuring 3.4 x 3.1 cm on image 51. This contains calcifications, soft tissue and fatty components. 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 The left adrenal gland appears normal. Multiple bilateral renal cysts are again noted, measuring up to 6.0 cm in the lower pole of the right kidney and up to 5.8 cm in the interpolar region of the left kidney.  Both kidneys demonstrate mild cortical thinning. There is no evidence of urinary tract calculus or hydronephrosis. The bladder appears normal. Stomach/Bowel: There appears to be mild soft tissue stranding around the distal stomach, proximal duodenum and pancreatic head. This is suboptimally evaluated due to the lack of intravenous and oral contrast. There is no extraluminal fluid collection. No evidence of bowel wall thickening, distention or  surrounding inflammatory change. Stable diverticular changes of the sigmoid colon. Vascular/Lymphatic: There are no enlarged abdominal or pelvic lymph nodes. Stable extensive atherosclerosis of the aorta, its branches and the iliac arteries. There is stable dilatation of the right common iliac artery. Reproductive: Unremarkable. Other: Stable asymmetric fat in the left inguinal canal and stable small umbilical hernia containing only fat. No evidence of ascites. Musculoskeletal: There are several new mixed lytic and sclerotic lesions within the lumbar spine. There is a dominant 2.2 cm sclerotic lesion in the S1 segment. Postsurgical changes are noted in the lower lumbar spine. There is no evidence of lumbar pathologic fracture or epidural tumor. IMPRESSION: 1. Possible inflammation involving the distal stomach and proximal duodenum in this patient with tarry stools. This is suboptimally evaluated by this examination. Consider endoscopic correlation. 2. The remainder of the small bowel and colon demonstrate no significant findings. There is distal colonic diverticulosis. 3. Left lower lobe pneumonia.  Small bilateral pleural effusions. 4. Multiple lytic and sclerotic lesions in the spine, ribs and pelvis, suspicious for metastatic disease or multiple myeloma. Correlate clinically. Probable pathologic fracture at T9. 5. Stable additional incidental findings including extensive atherosclerosis, cholelithiasis and a chronic right adrenal mass. Electronically Signed   By: Richardean Sale M.D.   On: 02/16/2015 09:45   Ct Chest Wo Contrast  02/16/2015  CLINICAL DATA:  Anemia, leukocytosis and tarry stools. Recent admission for sepsis. History of COPD, diabetes and hypertension. History of bladder cancer and plasma cell disorder. EXAM: CT CHEST, ABDOMEN AND PELVIS WITHOUT CONTRAST TECHNIQUE: Multidetector CT imaging of the chest, abdomen and pelvis was performed following the standard protocol without IV contrast.  COMPARISON:  Chest CT 12/15/2007, abdominal pelvic CT 06/30/2014 and chest radiographs 02/10/2015. Skeletal survey 05/24/2014. FINDINGS: CT CHEST Mediastinum/Nodes: There are no enlarged mediastinal, hilar or axillary lymph nodes. Small mediastinal lymph nodes are stable. The thyroid gland, trachea and esophagus demonstrate no significant findings. The heart size is normal. There is no pericardial effusion. There is extensive atherosclerosis of the aorta, great vessels and coronary arteries. Lungs/Pleura: There is minimal dependent pleural fluid bilaterally. As demonstrated on recent radiographs, there is airspace disease with consolidation and air bronchograms in the left lower lobe. There is minimal dependent atelectasis at the right lung base. A 6 mm left upper lobe nodule on image number 16 is stable. There are no new or enlarging pulmonary nodules. Mild emphysematous changes are present. Musculoskeletal/Chest wall: There is a T9 compression fracture which appears new and likely pathologic. There are multiple irregular lucent and sclerotic lesions throughout the thoracic spine and ribs. Small purely lytic lesion noted anteriorly in the left third rib (image 21). CT ABDOMEN AND PELVIS FINDINGS Hepatobiliary: As evaluated in the noncontrast state, the liver appears stable without suspicious findings. Small calcified gallstones are again noted. There is no gallbladder wall thickening or significant biliary dilatation. Pancreas: Unremarkable. No pancreatic ductal dilatation or surrounding inflammatory changes. Spleen: Normal in size without focal abnormality. Adrenals/Urinary Tract: Chronic right adrenal mass appears unchanged, measuring 3.4 x 3.1 cm on image 51. This contains calcifications, soft tissue and fatty components. This is  consistent with a benign finding based on stability, likely a myelolipoma. The left adrenal gland appears normal. Multiple bilateral renal cysts are again noted, measuring up to 6.0 cm  in the lower pole of the right kidney and up to 5.8 cm in the interpolar region of the left kidney. Both kidneys demonstrate mild cortical thinning. There is no evidence of urinary tract calculus or hydronephrosis. The bladder appears normal. Stomach/Bowel: There appears to be mild soft tissue stranding around the distal stomach, proximal duodenum and pancreatic head. This is suboptimally evaluated due to the lack of intravenous and oral contrast. There is no extraluminal fluid collection. No evidence of bowel wall thickening, distention or surrounding inflammatory change. Stable diverticular changes of the sigmoid colon. Vascular/Lymphatic: There are no enlarged abdominal or pelvic lymph nodes. Stable extensive atherosclerosis of the aorta, its branches and the iliac arteries. There is stable dilatation of the right common iliac artery. Reproductive: Unremarkable. Other: Stable asymmetric fat in the left inguinal canal and stable small umbilical hernia containing only fat. No evidence of ascites. Musculoskeletal: There are several new mixed lytic and sclerotic lesions within the lumbar spine. There is a dominant 2.2 cm sclerotic lesion in the S1 segment. Postsurgical changes are noted in the lower lumbar spine. There is no evidence of lumbar pathologic fracture or epidural tumor. IMPRESSION: 1. Possible inflammation involving the distal stomach and proximal duodenum in this patient with tarry stools. This is suboptimally evaluated by this examination. Consider endoscopic correlation. 2. The remainder of the small bowel and colon demonstrate no significant findings. There is distal colonic diverticulosis. 3. Left lower lobe pneumonia.  Small bilateral pleural effusions. 4. Multiple lytic and sclerotic lesions in the spine, ribs and pelvis, suspicious for metastatic disease or multiple myeloma. Correlate clinically. Probable pathologic fracture at T9. 5. Stable additional incidental findings including extensive  atherosclerosis, cholelithiasis and a chronic right adrenal mass. Electronically Signed   By: Richardean Sale M.D.   On: 02/16/2015 09:45   Mr Abdomen Wo Contrast  02/17/2015  CLINICAL DATA:  Evaluate adrenal mass EXAM: MRI ABDOMEN WITHOUT CONTRAST TECHNIQUE: Multiplanar multisequence MR imaging was performed without the administration of intravenous contrast. COMPARISON:  02/16/2015 FINDINGS: Lower chest: Small left effusion and left lower lobe airspace consolidation is noted. Hepatobiliary: Mild hepatic steatosis. There is no focal liver abnormality. Stones are identified within the gallbladder. These measure up to 6 mm. No biliary dilatation. The common bile duct has a normal caliber. Pancreas: Pancreas is unremarkable. Spleen: Negative Adrenals/Urinary Tract: Fat signal intensity within the 2.7 cm right adrenal mass is identified compatible with benign adrenal myelolipoma. Normal appearance of the left adrenal gland. Multiple bilateral renal cysts identified. The largest arises from the upper pole the right kidney measuring 6.3 cm. 5.5 cm cyst arises from the midpole of left kidney. No obstructive uropathy. Stomach/Bowel: The stomach is normal. The visualized upper abdominal bowel loops have a normal caliber. Vascular/Lymphatic: Aortic atherosclerosis noted. No aneurysm. No upper abdominal adenopathy identified. Other: No ascites or focal fluid collections identified within the upper abdomen. Musculoskeletal: Inferior endplate lesion at the T9 level is again identified. Lesion involving the posterior aspect of the T12 vertebra and right posterior element is again identified suspicious for metastatic disease, image number 15 of series 4. IMPRESSION: 1. 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 2. Mild hepatic steatosis. 3. Gallstones 4. Aortic atherosclerosis 5. T12 bone lesions suspicious for metastatic disease. 6. T9 compression fracture. Electronically Signed    By:  Kerby Moors M.D.   On: 02/17/2015 07:39      Lab Results  Component Value Date   HGBA1C 7.1* 02/14/2015   HGBA1C 7.0* 02/10/2015   HGBA1C 6.1* 09/29/2014   Lab Results  Component Value Date   CREATININE 0.67 02/18/2015       Scheduled Meds: . sodium chloride   Intravenous Once  . antiseptic oral rinse  7 mL Mouth Rinse BID  . atorvastatin  10 mg Oral Daily  . ceFEPime (MAXIPIME) IV  2 g Intravenous Q12H  . Chlorhexidine Gluconate Cloth  6 each Topical Q0600  . diltiazem  10 mg Intravenous Once  . diltiazem  30 mg Oral 4 times per day  . DULoxetine  30 mg Oral QPM  . feeding supplement (ENSURE ENLIVE)  237 mL Oral BID BM  . feeding supplement (GLUCERNA SHAKE)  237 mL Oral TID BM  . ferrous sulfate  325 mg Oral BID WC  . fluticasone  2 spray Each Nare Daily  . gabapentin  100 mg Oral QHS  . insulin aspart  0-9 Units Subcutaneous TID WC  . latanoprost  1 drop Both Eyes QPM  . levothyroxine  50 mcg Oral QAC breakfast  . metronidazole  500 mg Intravenous Q8H  . mupirocin ointment  1 application Nasal BID  . [START ON 02/19/2015] pantoprazole (PROTONIX) IV  40 mg Intravenous Q12H  . polyethylene glycol  17 g Oral Daily  . vancomycin  1,000 mg Intravenous Q12H   Continuous Infusions: . sodium chloride 125 mL/hr at 02/17/15 2314  . pantoprozole (PROTONIX) infusion 8 mg/hr (02/18/15 0600)    Principal Problem:   Sepsis ([place]) Active Problems:   Type 2 diabetes mellitus ([place])   Hypothyroidism   Hypertension   Hyperlipidemia   History of depression   COPD with acute exacerbation (HCC)   Low back pain radiating to left leg    Time spent: 45 minutes   [facility] Pager [phone]. If 7PM-7AM, please contact night-coverage at [email or web address], password [facility] 02/18/2015, 9:02 AM  LOS: 4 days

## 2015-02-18 NOTE — Progress Notes (Signed)
IP PROGRESS NOTE  Subjective:   Events noted in the last 24 to 48 hrs. He is feeling better this am. He is able to eat and keep food down.  No abdominal pain or discomfort. He denied any hematochezia or melena. He does report back pain periodically as well as discomfort with coughing and sneezing.  Endoscopy could not be completed because of tenuous respiratory status.  Objective:  Vital signs in last 24 hours: Temp:  [98 F (36.7 C)-98.5 F (36.9 C)] 98 F (36.7 C) (01/14 0935) Pulse Rate:  [75-108] 108 (01/14 0856) Resp:  [13-21] 17 (01/14 0856) BP: (100-127)/(31-63) 121/63 mmHg (01/14 0856) SpO2:  [95 %-100 %] 95 % (01/14 0856) Weight:  [217 lb 3.2 oz (98.521 kg)] 217 lb 3.2 oz (98.521 kg) (01/14 0622) Weight change: 4 lb 9.6 oz (2.087 kg) Last BM Date: 02/18/15  Intake/Output from previous day: 01/13 0701 - 01/14 0700 In: 4390 [P.O.:540; I.V.:3050; IV Piggyback:800] Out: 1575 [Urine:1575]  Alert, awake gentleman appeared without distress. Mouth: mucous membranes moist, pharynx normal without lesions Resp: clear to auscultation bilaterally Cardio: regular rate and rhythm, S1, S2 normal, no murmur, click, rub or gallop GI: soft, non-tender; bowel sounds normal; no masses,  no organomegaly Extremities: extremities normal, atraumatic, no cyanosis or edema    Lab Results:  Recent Labs  02/17/15 0528 02/18/15 0452  WBC 21.3* 18.8*  HGB 8.0* 7.1*  HCT 24.0* 21.2*  PLT 169 126*    BMET  Recent Labs  02/17/15 0528 02/18/15 0452  NA 140 138  K 4.7 4.4  CL 110 110  CO2 22 22  GLUCOSE 192* 176*  BUN 36* 19  CREATININE 0.77 0.67  CALCIUM 7.5* 7.3*    Studies/Results: Mr Abdomen Wo Contrast  02/17/2015  CLINICAL DATA:  Evaluate adrenal mass EXAM: MRI ABDOMEN WITHOUT CONTRAST TECHNIQUE: Multiplanar multisequence MR imaging was performed without the administration of intravenous contrast. COMPARISON:  02/16/2015 FINDINGS: Lower chest: Small left effusion and  left lower lobe airspace consolidation is noted. Hepatobiliary: Mild hepatic steatosis. There is no focal liver abnormality. Stones are identified within the gallbladder. These measure up to 6 mm. No biliary dilatation. The common bile duct has a normal caliber. Pancreas: Pancreas is unremarkable. Spleen: Negative Adrenals/Urinary Tract: Fat signal intensity within the 2.7 cm right adrenal mass is identified compatible with benign adrenal myelolipoma. Normal appearance of the left adrenal gland. Multiple bilateral renal cysts identified. The largest arises from the upper pole the right kidney measuring 6.3 cm. 5.5 cm cyst arises from the midpole of left kidney. No obstructive uropathy. Stomach/Bowel: The stomach is normal. The visualized upper abdominal bowel loops have a normal caliber. Vascular/Lymphatic: Aortic atherosclerosis noted. No aneurysm. No upper abdominal adenopathy identified. Other: No ascites or focal fluid collections identified within the upper abdomen. Musculoskeletal: Inferior endplate lesion at the T9 level is again identified. Lesion involving the posterior aspect of the T12 vertebra and right posterior element is again identified suspicious for metastatic disease, image number 15 of series 4. IMPRESSION: 1. 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 2. Mild hepatic steatosis. 3. Gallstones 4. Aortic atherosclerosis 5. T12 bone lesions suspicious for metastatic disease. 6. T9 compression fracture. Electronically Signed   By: Kerby Moors M.D.   On: 02/17/2015 07:39    Medications: I have reviewed the patient's current medications.  Results for ROMOLO, SIELING (MRN [id number]) as of 02/18/2015 11:45  Ref. Range 02/17/2015 05:28  PSA Latest Ref Range: 0.00-4.00  ng/mL 0.02   The SPE pattern reflects hypoalbuminemia. Evidence of monoclonal  protein is not apparent.    Assessment/Plan:  82 year old gentleman with the following  issues:  1. Mixed lytic and sclerotic lesions noted on her CT scan on 02/15/2014. Previous imaging studies have been reviewed including MRI of the spine on 03/02/2014, skeletal survey in April 2016 as well as bone scan in January 2016. It appeared that he had very small lesion with in the posterior body of S1. Other than that, all imaging studies did not show any evidence of sclerotic or lytic lesions.  His serum protein electrophoresis from 02/15/2015 showed no monoclonal spike with hypoalbuminemia.  PSA is low 0.02 making prostate cancer less of a possibility.  The differential diagnosis includes metastatic malignancy and less likely myeloma.  GI malignancy still a possibility given his abnormality in the CT scan.  My recommendation is to proceed with endoscopy and biopsy of the distal stomach and proximal small intestine's  if possible. If this procedure cannot be done or not diagnostic than a percutaneous biopsy of his bone lesions would be the next step.  I discussed these steps with him and his daughter extensively and he is agreeable to proceed.  I will ask interventional radiology to kindly evaluate the patient for a possible biopsy of his S1 lesion measuring 2.2 cm next week if endoscopy cannot be done or the findings do not suggest malignancy.  2. Anemia: Likely related to GI bleed could be worsened by malignancy. I agree with the current approach and transfusions as you are doing.   3. Community-acquired pneumonia: Currently on antibiotics with stable respiratory status at this time. He is oxygenating her percent with very minimal oxygen supplements.  4. 2.7 cm adrenal mass evaluated by MRI on 02/16/2015 and appears to be benign adenoma.  All of his questions and his family's questions were answered today to their satisfaction.   LOS: 4 days   [facility] 02/18/2015, 11:42 AM

## 2015-02-19 DIAGNOSIS — Z66 Do not resuscitate: Secondary | ICD-10-CM | POA: Insufficient documentation

## 2015-02-19 DIAGNOSIS — J438 Other emphysema: Secondary | ICD-10-CM

## 2015-02-19 DIAGNOSIS — R531 Weakness: Secondary | ICD-10-CM | POA: Insufficient documentation

## 2015-02-19 DIAGNOSIS — Z515 Encounter for palliative care: Secondary | ICD-10-CM | POA: Insufficient documentation

## 2015-02-19 DIAGNOSIS — K921 Melena: Secondary | ICD-10-CM

## 2015-02-19 LAB — GLUCOSE, CAPILLARY
GLUCOSE-CAPILLARY: 143 mg/dL — AB (ref 65–99)
GLUCOSE-CAPILLARY: 146 mg/dL — AB (ref 65–99)
GLUCOSE-CAPILLARY: 139 mg/dL — AB (ref 65–99)
Glucose-Capillary: 160 mg/dL — ABNORMAL HIGH (ref 65–99)

## 2015-02-19 LAB — CBC
HCT: 28.2 % — ABNORMAL LOW (ref 39.0–52.0)
HEMOGLOBIN: 9.2 g/dL — AB (ref 13.0–17.0)
MCH: 29 pg (ref 26.0–34.0)
MCHC: 32.6 g/dL (ref 30.0–36.0)
MCV: 89 fL (ref 78.0–100.0)
PLATELETS: 152 10*3/uL (ref 150–400)
RBC: 3.17 MIL/uL — AB (ref 4.22–5.81)
RDW: 15.2 % (ref 11.5–15.5)
WBC: 17.1 10*3/uL — AB (ref 4.0–10.5)

## 2015-02-19 LAB — COMPREHENSIVE METABOLIC PANEL
ALBUMIN: 1.7 g/dL — AB (ref 3.5–5.0)
ALK PHOS: 71 U/L (ref 38–126)
ALT: 20 U/L (ref 17–63)
ANION GAP: 9 (ref 5–15)
AST: 24 U/L (ref 15–41)
BUN: 12 mg/dL (ref 6–20)
CHLORIDE: 105 mmol/L (ref 101–111)
CO2: 23 mmol/L (ref 22–32)
CREATININE: 0.58 mg/dL — AB (ref 0.61–1.24)
Calcium: 7.5 mg/dL — ABNORMAL LOW (ref 8.9–10.3)
Glucose, Bld: 189 mg/dL — ABNORMAL HIGH (ref 65–99)
Potassium: 3.5 mmol/L (ref 3.5–5.1)
SODIUM: 137 mmol/L (ref 135–145)
Total Bilirubin: 0.5 mg/dL (ref 0.3–1.2)
Total Protein: 4.8 g/dL — ABNORMAL LOW (ref 6.5–8.1)

## 2015-02-19 LAB — TYPE AND SCREEN
ABO/RH(D): A POS
ANTIBODY SCREEN: NEGATIVE
UNIT DIVISION: 0
UNIT DIVISION: 0
UNIT DIVISION: 0
Unit division: 0
Unit division: 0

## 2015-02-19 LAB — CULTURE, BLOOD (ROUTINE X 2)
CULTURE: NO GROWTH
Culture: NO GROWTH

## 2015-02-19 MED ORDER — BUDESONIDE 0.25 MG/2ML IN SUSP
0.2500 mg | Freq: Two times a day (BID) | RESPIRATORY_TRACT | Status: DC
  Administered 2015-02-19 – 2015-02-24 (×10): 0.25 mg via RESPIRATORY_TRACT
  Filled 2015-02-19 (×10): qty 2

## 2015-02-19 NOTE — Progress Notes (Signed)
Notified that pt converted back into NSR. Will continue to monitor.   Ruben Reason, RN

## 2015-02-19 NOTE — Progress Notes (Signed)
Report received in patient's room using SBAR format, updated on today's events, new orders, VS, assumed care of patient.

## 2015-02-19 NOTE — Evaluation (Signed)
Physical Therapy Evaluation Patient Details Name: Taylor [name] MRN: [id number] DOB: 1928/11/19 Today's Date: 02/19/2015   History of Present Illness  82 year old male with a history of paroxysmal atrial fibrillation, COPD, on nocturnal oxygen 2.5 L, diabetes mellitus, hypertension, recently admitted 1/6-1/8 for community-acquired pneumonia, left lower lobe, discharged home, who presents with weakness, persistent nausea, syncopal episode at home, and a fall.  Clinical Impression  Pt admitted with the above complications. Pt currently with functional limitations due to the deficits listed below (see PT Problem List). Demonstrates generalized deconditioning with ability to ambulate short distance on room air with SpO2 94%, HR up to 114. Requires assistance with bed mobility due to pain. Educated on back precautions for comfort. Has limited assistance at home from daughter during the day only. Pt will benefit from skilled PT to increase their independence and safety with mobility to allow discharge to the venue listed below.       Follow Up Recommendations SNF    Equipment Recommendations  None recommended by PT    Recommendations for Other Services       Precautions / Restrictions Precautions Precautions: Fall Precaution Comments: back for comfort Restrictions Weight Bearing Restrictions: No      Mobility  Bed Mobility Overal bed mobility: Needs Assistance Bed Mobility: Rolling;Sidelying to Sit Rolling: Min assist Sidelying to sit: Min assist       General bed mobility comments: Educated on log roll for comfort. Min assist, difficulty reaching with LUE. Assist with truncal support.  Transfers Overall transfer level: Needs assistance Equipment used: Rolling walker (2 wheeled) Transfers: Sit to/from Stand Sit to Stand: Min assist         General transfer comment: Min assist for boost to stand. VC for technique and hand placement. Performed from lowest bed  setting.  Ambulation/Gait Ambulation/Gait assistance: Min guard Ambulation Distance (Feet): 20 Feet Assistive device: Rolling walker (2 wheeled) Gait Pattern/deviations: Step-through pattern;Decreased stride length;Trunk flexed Gait velocity: slow Gait velocity interpretation: <1.8 ft/sec, indicative of risk for recurrent falls General Gait Details: Educated on safe DME use with a rolling walker. Very slow but fairly stable without loss of balance. Fatigues easily. SpO2 94% on room air during short bout, minimal dyspnea. Cues for upright posture and walker placement.  Stairs            Wheelchair Mobility    Modified Rankin (Stroke Patients Only)       Balance Overall balance assessment: Needs assistance Sitting-balance support: No upper extremity supported;Feet supported Sitting balance-Leahy Scale: Good     Standing balance support: No upper extremity supported Standing balance-Leahy Scale: Fair                               Pertinent Vitals/Pain Pain Assessment: 0-10 Pain Score: 5  Pain Location: back Pain Descriptors / Indicators: Aching Pain Intervention(s): Monitored during session;Repositioned    Home Living Family/patient expects to be discharged to:: Unsure Living Arrangements: Children Available Help at Discharge: Available PRN/intermittently (daughter stays during day. pt alone at night) Type of Home: House Home Access: Stairs to enter Entrance Stairs-Rails: Right Entrance Stairs-Number of Steps: 2 Home Layout: One level Home Equipment: Walker - 2 wheels;Cane - single point;Bedside commode;Shower seat      Prior Function Level of Independence: Independent with assistive device(s)         Comments: walker to ambulate since his previous recent admission. Could bath dress self  Hand Dominance   Dominant Hand: Right    Extremity/Trunk Assessment   Upper Extremity Assessment: Defer to OT evaluation           Lower  Extremity Assessment: Generalized weakness (Reports BIL peripheral neuropathy with numbness)      Cervical / Trunk Assessment: Kyphotic  Communication   Communication: No difficulties  Cognition Arousal/Alertness: Awake/alert Behavior During Therapy: WFL for tasks assessed/performed Overall Cognitive Status: Within Functional Limits for tasks assessed                      General Comments General comments (skin integrity, edema, etc.): Granddaughter present and supportive. Discussed role of PT and progression during this admission and follow-up recs.    Exercises General Exercises - Lower Extremity Ankle Circles/Pumps: AROM;Both;10 reps;Seated      Assessment/Plan    PT Assessment Patient needs continued PT services  PT Diagnosis Generalized weakness;Difficulty walking;Abnormality of gait;Acute pain   PT Problem List Decreased strength;Decreased activity tolerance;Decreased range of motion;Decreased balance;Decreased mobility;Decreased knowledge of use of DME;Cardiopulmonary status limiting activity;Pain;Impaired sensation  PT Treatment Interventions Gait training;Patient/family education;Therapeutic exercise;Therapeutic activities;DME instruction;Functional mobility training;Balance training;Neuromuscular re-education;Modalities   PT Goals (Current goals can be found in the Care Plan section) Acute Rehab PT Goals Patient Stated Goal: Get help moving before I return home PT Goal Formulation: With patient Time For Goal Achievement: March 26, 2015 Potential to Achieve Goals: Good    Frequency Min 2X/week   Barriers to discharge Decreased caregiver support Does not have 24/7 assistance    Co-evaluation               End of Session Equipment Utilized During Treatment: Oxygen Activity Tolerance: Patient tolerated treatment well Patient left: in chair;with call bell/phone within reach;with family/visitor present;with chair alarm set Nurse Communication: Other (comment)  (could not reach via telephone)         Time: 9794-8016 PT Time Calculation (min) (ACUTE ONLY): 37 min   Charges:   PT Evaluation $PT Eval Moderate Complexity: 1 Procedure PT Treatments $Therapeutic Activity: 8-22 mins   PT G CodesEllouise Newer 02/19/2015, 5:44 PM  Camille Bal [place], [place]

## 2015-02-19 NOTE — Progress Notes (Addendum)
[name] PROGRESS NOTE  Taylor [name] WCB:[id number] DOB: [birth year] DOA: 02/14/2015 PCP: [name], MD  Length of stay: 5   Assessment/Plan: Principal Problem:   Sepsis ([place]) Active Problems:   Type 2 diabetes mellitus ([place])   Hypothyroidism   Hypertension   Hyperlipidemia   History of depression   COPD with acute exacerbation ([place])   Low back pain radiating to left leg   DNR (do not resuscitate)   Palliative care encounter   Weakness generalized    HPI:  82 year old male with a history of paroxysmal atrial fibrillation on xarelto, COPD, on nocturnal oxygen 2.5 L, diabetes mellitus, hypertension, recently admitted 1/6-1/8 for community-acquired pneumonia, left lower lobe, discharged home on levofloxacin for 7 days, who presents with weakness, persistent nausea, syncopal episode at home, and the patient remembers hitting the back of his head. Patient was found on the floor by his family, daughter brings him in states that patient has been weak and bed bound since his discharge. Due to nausea the patient has not been eating or drinking.   Patient has a history of chronic low back pain, followed by Dr. [name], had an MRI 2 weeks ago, was found to have lesions concerning for metastatic disease and was referred to Dr. [name] for further evaluation. Patient has been evaluated by oncology Dr. [name] during this admission. He had a face-to-face meeting with the family on 1/14.  On arrival systolic blood pressure was 64 /34 in the ER on 1/10. Sepsis protocol was initiated. White count appears to be persistently high at 17.9. Hemoglobin has dropped from 10.4>9.2. Creatinine has increased from 0.7>2.03.  GI was consulted because the patient was noted to have 2 episodes of black tarry stools. Dr. [name] will not attempt EGD due to tenuous pulmonary status. Plan is for IR to due bone biopsy. Continue current antibiotic therapy and follow clinical response.  Overall improving.   Assessment and plan Sepsis with hypovolemic shock, hemorrhagic shock  Resolved  -Likely in the setting of recent left lower lobe pneumonia with incomplete resolution, ongoing bleeding  -White count still elevated, but trending down -lactate on admit 2.86 >1.6 now -Pro calcitonin <0.10 -Blood culture from 1/6 , 1/10 remain negative -Currently on day #6 of vancomycin and cefepime for sepsis coverage -per Dr. [name] who recently performed MRI of the patient's lumbar spine and there was no evidence of discitis -CT chest showed left lower lobe pneumonia  Syncope likely secondary to orthostatic hypotension in the setting of pneumonia/sepsis, GI bleeding, underlying malignancy. -negative  troponin, 2-D echo [showed EF of 60-65%], doubt PE, as patient was getting xarelto prior to admission -CT head negative  -Will monitor on tele  Community-acquired pneumonia (diagnosed 1/6) with underlying history of COPD, chronic respiratory failure -Continue antibiotics as above -Previous Influenza panel negative, urine strep antigen negative.  -Previous Pro calcitonin less than 0.1, lactic acid 1.0.  -Previous Blood cultures remain negative to date. -will continue neb treatments and add pulmicort    Hyperkalemia  -Previously K+ 6.5 ,secondary to dehydration -resolved    Severe COPD with chronic respiratory failure -Using 1-2 Lpm supplemental O2 at night while home  -will continue duo nebs, wean O2 as tolerated to baseline -will add flutter valve and pulmicort   Type II DM  - Holding home metformin while inpt  - Continue sliding scale insulin, hemoglobin A1c 7.0  Fall -given that the patient is on xarelto  CT head done to  rule out intracranial bleeding. -neg images studies for acute abnormalities  -PT evaluation requested   Normocytic Anemia , likely now with acute blood loss anemia, malignancy  -Hemoglobin was 10.8 on 1/6, dropped to 6.7 on 1/ 11,status   posttransfusion of 5 units of packed red blood cells (last transfusion on 1/14) -Discontinued xarelto,   -will follow Hgb trend -CT abd/pelvis showed possible inflammation of the distal stomach and proximal duodenum,unable to do EGD. -CT also showed lytic and sclerotic lesions in the spine and ribs and pelvis suspicious for metastatic disease or multiple myeloma  -Repeat  SPEP /UPEP,Pending, oncology following  -plan is for bone biopsy by IR -will follow rec's -continue IV protonix (had received 3 days of PPI drip)  Paroxysmal atrial fib  -discontinued  Xarelto due to bleeding -continue cardizem with holding parameters for rate control -CHADS vasc 2, patient was not on any anticoagulation prior to his left knee total arthroplasty and was placed on xarelto for DVT prophylaxis.   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 [name] has talked with IR for bone biopsy -no safe to have EGD per dr. Benson [place]    DVT: SCD's  Code Status:      Code Status Orders        Start     Ordered   02/14/15 1720  Full code   Continuous     02/14/15 1721    Resolved   Family Communication: Discussed in detail with the patient, all imaging results, lab results explained to the patient   Disposition Plan:  Continue inpatient status; will continue PPI IV; planning bone biopsy by IR. Will follow clinical response. Patient weak and deconditioned.  Consultants:  GI  Oncologist  IR  Procedures:  None  Antibiotics: Anti-infectives    Start     Dose/Rate Route Frequency Ordered Stop   02/16/15 0900  metroNIDAZOLE (FLAGYL) IVPB 500 mg  Status:  Discontinued     500 mg 100 mL/hr over 60 Minutes Intravenous Every 8 hours 02/16/15 0831 02/19/15 1220   02/15/15 0400  vancomycin (VANCOCIN)  IVPB 1000 mg/200 mL premix     1,000 mg 200 mL/hr over 60 Minutes Intravenous Every 12 hours 02/14/15 1457     02/15/15 0330  ceFEPIme (MAXIPIME) 2 g in dextrose 5 % 50 mL IVPB     2 g 100 mL/hr over 30 Minutes Intravenous Every 12 hours 02/14/15 1457     02/14/15 1530  vancomycin (VANCOCIN) 1,750 mg in sodium chloride 0.9 % 500 mL IVPB     1,750 mg 250 mL/hr over 120 Minutes Intravenous  Once 02/14/15 1449 02/14/15 1917   02/14/15 1500  ceFEPIme (MAXIPIME) 2 g in dextrose 5 % 50 mL IVPB     2 g 100 mL/hr over 30 Minutes Intravenous  Once 02/14/15 1446 02/14/15 1613   02/14/15 1500  vancomycin (VANCOCIN) IVPB 1000 mg/200 mL premix  Status:  Discontinued     1,000 mg 200 mL/hr over 60 Minutes Intravenous  Once 02/14/15 1446 02/14/15 1449      HPI/Subjective: Tachycardia improved with transfusion; Hgb up to 9 now. Patient breathing better. Felling tired.    Objective: Filed Vitals:   02/19/15 0556 02/19/15 0741 02/19/15 0920 02/19/15 1132  BP: 121/65 122/51  108/67  Pulse:  97  100  Temp:  98.2 F (36.8 C)  97.7 F (36.5 C)  TempSrc:  Oral  Oral  Resp:  19  24  Height:      Weight:  96.344 kg (212 lb 6.4 oz)    SpO2:  96% 97% 97%    Intake/Output Summary (Last 24 hours) at 02/19/15 1402 Last data filed at 02/19/15 1300  Gross per 24 hour  Intake   3280 ml  Output   3625 ml  Net   -345 ml    Exam:  General: afebrile, feeling somewhat better and breathing ok. Patient with episode of bloody stool requiring 2 units of PRBC's on 02/18/15. Currently with some abd pain; No acute respiratory distress Lungs: good air movement; mild exp wheezing and scattered rhonchi on exam Cardiovascular: rate controlled, no rubs or gallops; no JVD Abdomen: tender to palpation on left mid abdomen; positive BS, no guarding Extremities: No significant cyanosis or clubbing on bilateral lower extremities; trace edema appreciated  Data Review   Micro Results Recent Results (from the past 240  hour(s))  Culture, blood (routine x 2) Call MD if unable to obtain prior to antibiotics being given     Status: None   Collection Time: 02/10/15  9:55 PM  Result Value Ref Range Status   Specimen Description BLOOD LEFT ANTECUBITAL  Final   Special Requests BOTTLES DRAWN AEROBIC AND ANAEROBIC 5CC  Final   Culture NO GROWTH 5 DAYS  Final   Report Status 02/15/2015 FINAL  Final  Culture, blood (routine x 2) Call MD if unable to obtain prior to antibiotics being given     Status: None   Collection Time: 02/10/15 10:02 PM  Result Value Ref Range Status   Specimen Description BLOOD RIGHT ARM  Final   Special Requests IN PEDIATRIC BOTTLE [place]  Final   Culture NO GROWTH 5 DAYS  Final   Report Status 02/15/2015 FINAL  Final  MRSA PCR Screening     Status: Abnormal   Collection Time: 02/11/15  4:48 AM  Result Value Ref Range Status   MRSA by PCR POSITIVE (A) NEGATIVE Final    Comment:        The GeneXpert MRSA Assay (FDA approved for NASAL specimens only), is one component of a comprehensive MRSA colonization surveillance program. It is not intended to diagnose MRSA infection nor to guide or monitor treatment for MRSA infections. RESULT CALLED TO, READ BACK BY AND VERIFIED WITH: R HANEY @0649  02/05/15 MKELLY   Blood Culture (routine x 2)     Status: None (Preliminary result)   Collection Time: 02/14/15  3:05 PM  Result Value Ref Range Status   Specimen Description BLOOD LEFT HAND  Final   Special Requests BOTTLES DRAWN AEROBIC AND ANAEROBIC 4CC  Final   Culture NO GROWTH 4 DAYS  Final   Report Status PENDING  Incomplete  Blood Culture (routine x 2)     Status: None (Preliminary result)   Collection Time: 02/14/15  3:30 PM  Result Value Ref Range Status   Specimen Description BLOOD LEFT WRIST  Final   Special Requests BOTTLES DRAWN AEROBIC AND ANAEROBIC 5CC  Final   Culture NO GROWTH 4 DAYS  Final   Report Status PENDING  Incomplete  Urine culture     Status: None   Collection  Time: 02/14/15  6:41 PM  Result Value Ref Range Status   Specimen Description URINE, RANDOM  Final   Special Requests NONE  Final   Culture 3,000 COLONIES/mL INSIGNIFICANT GROWTH  Final   Report Status 02/15/2015 FINAL  Final  Urine culture     Status: None   Collection Time: 02/14/15 10:12 PM  Result Value Ref Range Status   Specimen Description URINE, RANDOM  Final   Special Requests NONE  Final   Culture NO GROWTH 2 DAYS  Final   Report Status 02/16/2015 FINAL  Final  Urine culture     Status: None   Collection Time: 02/16/15  7:20 AM  Result Value Ref Range Status   Specimen Description URINE, RANDOM  Final   Special Requests NONE  Final   Culture NO GROWTH 1 DAY  Final   Report Status 02/17/2015 FINAL  Final   Radiology Reports Ct Abdomen Pelvis Wo Contrast  02/16/2015  CLINICAL DATA:  Anemia, leukocytosis and tarry stools. Recent admission for sepsis. History of COPD, diabetes and hypertension. History of bladder cancer and plasma cell disorder. EXAM: CT CHEST, ABDOMEN AND PELVIS WITHOUT CONTRAST TECHNIQUE: Multidetector CT imaging of the chest, abdomen and pelvis was performed following the standard protocol without IV contrast. COMPARISON:  Chest CT 12/15/2007, abdominal pelvic CT 06/30/2014 and chest radiographs 02/10/2015. Skeletal survey 05/24/2014. FINDINGS: CT CHEST Mediastinum/Nodes: There are no enlarged mediastinal, hilar or axillary lymph nodes. Small mediastinal lymph nodes are stable. The thyroid gland, trachea and esophagus demonstrate no significant findings. The heart size is normal. There is no pericardial effusion. There is extensive atherosclerosis of the aorta, great vessels and coronary arteries. Lungs/Pleura: There is minimal dependent pleural fluid bilaterally. As demonstrated on recent radiographs, there is airspace disease with consolidation and air bronchograms in the left lower lobe. There is minimal dependent atelectasis at the right lung base. A 6 mm left  upper lobe nodule on image number 16 is stable. There are no new or enlarging pulmonary nodules. Mild emphysematous changes are present. Musculoskeletal/Chest wall: There is a T9 compression fracture which appears new and likely pathologic. There are multiple irregular lucent and sclerotic lesions throughout the thoracic spine and ribs. Small purely lytic lesion noted anteriorly in the left third rib (image 21). CT ABDOMEN AND PELVIS FINDINGS Hepatobiliary: As evaluated in the noncontrast state, the liver appears stable without suspicious findings. Small calcified gallstones are again noted. There is no gallbladder wall thickening or significant biliary dilatation. Pancreas: Unremarkable. No pancreatic ductal dilatation or surrounding inflammatory changes. Spleen: Normal in size without focal abnormality. Adrenals/Urinary Tract: Chronic right adrenal mass appears unchanged, measuring 3.4 x 3.1 cm on image 51. This contains calcifications, soft tissue and fatty components. 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 The left adrenal gland appears normal. Multiple bilateral renal cysts are again noted, measuring up to 6.0 cm in the lower pole of the right kidney and up to 5.8 cm in the interpolar region of the left kidney. Both kidneys demonstrate mild cortical thinning. There is no evidence of urinary tract calculus or hydronephrosis. The bladder appears normal. Stomach/Bowel: There appears to be mild soft tissue stranding around the distal stomach, proximal duodenum and pancreatic head. This is suboptimally evaluated due to the lack of intravenous and oral contrast. There is no extraluminal fluid collection. No evidence of bowel wall thickening, distention or surrounding inflammatory change. Stable diverticular changes of the sigmoid colon. Vascular/Lymphatic: There are no enlarged abdominal or pelvic lymph nodes. Stable extensive atherosclerosis of the aorta, its branches and  the iliac arteries. There is stable dilatation of the right common iliac artery. Reproductive: Unremarkable. Other: Stable asymmetric fat in the  left inguinal canal and stable small umbilical hernia containing only fat. No evidence of ascites. Musculoskeletal: There are several new mixed lytic and sclerotic lesions within the lumbar spine. There is a dominant 2.2 cm sclerotic lesion in the S1 segment. Postsurgical changes are noted in the lower lumbar spine. There is no evidence of lumbar pathologic fracture or epidural tumor. IMPRESSION: 1. Possible inflammation involving the distal stomach and proximal duodenum in this patient with tarry stools. This is suboptimally evaluated by this examination. Consider endoscopic correlation. 2. The remainder of the small bowel and colon demonstrate no significant findings. There is distal colonic diverticulosis. 3. Left lower lobe pneumonia.  Small bilateral pleural effusions. 4. Multiple lytic and sclerotic lesions in the spine, ribs and pelvis, suspicious for metastatic disease or multiple myeloma. Correlate clinically. Probable pathologic fracture at T9. 5. Stable additional incidental findings including extensive atherosclerosis, cholelithiasis and a chronic right adrenal mass. Electronically Signed   By: Richardean Sale M.D.   On: 02/16/2015 09:45   Dg Chest 2 View  02/10/2015  CLINICAL DATA:  Left-sided chest pain in the mornings for the last few days. EXAM: CHEST  2 VIEW COMPARISON:  04/12/2011 FINDINGS: Heart size is normal. There is atherosclerosis of the aorta. The right lung is clear. There is abnormal density in the left lower lobe posteriorly most consistent with bronchopneumonia. Follow-up to clearing recommended to ensure there is not a mass lesion. No effusion. No acute bone finding. IMPRESSION: Left lower lobe infiltrate most consistent with bronchopneumonia. Follow-up to clearing to rule out underlying mass. Electronically Signed   By: Nelson Chimes M.D.    On: 02/10/2015 15:12   Ct Head Wo Contrast  02/14/2015  CLINICAL DATA:  Syncope and headache. EXAM: CT HEAD WITHOUT CONTRAST TECHNIQUE: Contiguous axial images were obtained from the base of the skull through the vertex without intravenous contrast. 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 Stable cortical atrophy. The brain demonstrates no evidence of hemorrhage, acute infarction, edema, mass effect, extra-axial fluid collection, hydrocephalus or mass lesion. The skull is unremarkable. IMPRESSION: No acute findings. 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 Electronically Signed   By: Aletta Edouard M.D.   On: 02/14/2015 17:51   Ct Chest Wo Contrast  02/16/2015  CLINICAL DATA:  Anemia, leukocytosis and tarry stools. Recent admission for sepsis. History of COPD, diabetes and hypertension. History of bladder cancer and plasma cell disorder. EXAM: CT CHEST, ABDOMEN AND PELVIS WITHOUT CONTRAST TECHNIQUE: Multidetector CT imaging of the chest, abdomen and pelvis was performed following the standard protocol without IV contrast. COMPARISON:  Chest CT 12/15/2007, abdominal pelvic CT 06/30/2014 and chest radiographs 02/10/2015. Skeletal survey 05/24/2014. FINDINGS: CT CHEST Mediastinum/Nodes: There are no enlarged mediastinal, hilar or axillary lymph nodes. Small mediastinal lymph nodes are stable. The thyroid gland, trachea and esophagus demonstrate no significant findings. The heart size is normal. There is no pericardial effusion. There is extensive atherosclerosis of the aorta, great vessels and coronary arteries. Lungs/Pleura: There is minimal dependent pleural fluid bilaterally. As demonstrated on recent radiographs, there is airspace disease with consolidation and air bronchograms in the left lower lobe. There is minimal dependent atelectasis at the right lung base. A 6 mm left upper lobe nodule  on image number 16 is stable. There are no new or enlarging pulmonary nodules. Mild emphysematous changes are present. Musculoskeletal/Chest wall: There is a T9 compression fracture which appears  new and likely pathologic. There are multiple irregular lucent and sclerotic lesions throughout the thoracic spine and ribs. Small purely lytic lesion noted anteriorly in the left third rib (image 21). CT ABDOMEN AND PELVIS FINDINGS Hepatobiliary: As evaluated in the noncontrast state, the liver appears stable without suspicious findings. Small calcified gallstones are again noted. There is no gallbladder wall thickening or significant biliary dilatation. Pancreas: Unremarkable. No pancreatic ductal dilatation or surrounding inflammatory changes. Spleen: Normal in size without focal abnormality. Adrenals/Urinary Tract: Chronic right adrenal mass appears unchanged, measuring 3.4 x 3.1 cm on image 51. This contains calcifications, soft tissue and fatty components. 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 The left adrenal gland appears normal. Multiple bilateral renal cysts are again noted, measuring up to 6.0 cm in the lower pole of the right kidney and up to 5.8 cm in the interpolar region of the left kidney. Both kidneys demonstrate mild cortical thinning. There is no evidence of urinary tract calculus or hydronephrosis. The bladder appears normal. Stomach/Bowel: There appears to be mild soft tissue stranding around the distal stomach, proximal duodenum and pancreatic head. This is suboptimally evaluated due to the lack of intravenous and oral contrast. There is no extraluminal fluid collection. No evidence of bowel wall thickening, distention or surrounding inflammatory change. Stable diverticular changes of the sigmoid colon. Vascular/Lymphatic: There are no enlarged abdominal or pelvic lymph nodes. Stable extensive atherosclerosis of the aorta, its branches and the iliac  arteries. There is stable dilatation of the right common iliac artery. Reproductive: Unremarkable. Other: Stable asymmetric fat in the left inguinal canal and stable small umbilical hernia containing only fat. No evidence of ascites. Musculoskeletal: There are several new mixed lytic and sclerotic lesions within the lumbar spine. There is a dominant 2.2 cm sclerotic lesion in the S1 segment. Postsurgical changes are noted in the lower lumbar spine. There is no evidence of lumbar pathologic fracture or epidural tumor. IMPRESSION: 1. Possible inflammation involving the distal stomach and proximal duodenum in this patient with tarry stools. This is suboptimally evaluated by this examination. Consider endoscopic correlation. 2. The remainder of the small bowel and colon demonstrate no significant findings. There is distal colonic diverticulosis. 3. Left lower lobe pneumonia.  Small bilateral pleural effusions. 4. Multiple lytic and sclerotic lesions in the spine, ribs and pelvis, suspicious for metastatic disease or multiple myeloma. Correlate clinically. Probable pathologic fracture at T9. 5. Stable additional incidental findings including extensive atherosclerosis, cholelithiasis and a chronic right adrenal mass. Electronically Signed   By: Richardean Sale M.D.   On: 02/16/2015 09:45   Mr Abdomen Wo Contrast  02/17/2015  CLINICAL DATA:  Evaluate adrenal mass EXAM: MRI ABDOMEN WITHOUT CONTRAST TECHNIQUE: Multiplanar multisequence MR imaging was performed without the administration of intravenous contrast. COMPARISON:  02/16/2015 FINDINGS: Lower chest: Small left effusion and left lower lobe airspace consolidation is noted. Hepatobiliary: Mild hepatic steatosis. There is no focal liver abnormality. Stones are identified within the gallbladder. These measure up to 6 mm. No biliary dilatation. The common bile duct has a normal caliber. Pancreas: Pancreas is unremarkable. Spleen: Negative Adrenals/Urinary Tract: Fat  signal intensity within the 2.7 cm right adrenal mass is identified compatible with benign adrenal myelolipoma. Normal appearance of the left adrenal gland. Multiple bilateral renal cysts identified. The largest arises from the upper pole the right kidney measuring 6.3 cm. 5.5 cm cyst arises from the midpole of left kidney. No obstructive uropathy. Stomach/Bowel: The stomach is normal. The visualized  upper abdominal bowel loops have a normal caliber. Vascular/Lymphatic: Aortic atherosclerosis noted. No aneurysm. No upper abdominal adenopathy identified. Other: No ascites or focal fluid collections identified within the upper abdomen. Musculoskeletal: Inferior endplate lesion at the T9 level is again identified. Lesion involving the posterior aspect of the T12 vertebra and right posterior element is again identified suspicious for metastatic disease, image number 15 of series 4. IMPRESSION: 1. 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 2. Mild hepatic steatosis. 3. Gallstones 4. Aortic atherosclerosis 5. T12 bone lesions suspicious for metastatic disease. 6. T9 compression fracture. Electronically Signed   By: Kerby Moors M.D.   On: 02/17/2015 07:39   Dg Chest Port 1 View  02/14/2015  CLINICAL DATA:  Pneumonia. EXAM: PORTABLE CHEST 1 VIEW COMPARISON:  02/10/2015 FINDINGS: Left lower lobe infiltrate again identified. This may be slightly smaller and less dense compared to the prior chest x-ray. No edema or pleural fluid. The heart size and mediastinal contours are normal. IMPRESSION: Slightly smaller and less dense appearing left lower lobe pneumonia. Electronically Signed   By: Aletta Edouard M.D.   On: 02/14/2015 15:18     CBC  Recent Labs Lab 02/14/15 1530  02/14/15 2213  02/15/15 2339  02/16/15 1010 02/16/15 2106 02/17/15 0528 02/18/15 0452 02/19/15 0525  WBC 17.9*  --  17.5*  < > 24.5*  --  25.0*  --  21.3* 18.8* 17.1*  HGB 8.4*  < > 8.6*  < >  6.7*  < > 6.6* 8.5* 8.0* 7.1* 9.2*  HCT 27.9*  < > 28.6*  < > 21.5*  < > 20.3* 25.2* 24.0* 21.2* 28.2*  PLT 236  --  204  < > 237  --  198  --  169 126* 152  MCV 90.9  --  90.8  < > 91.1  --  87.1  --  87.9 88.3 89.0  MCH 27.4  --  27.3  < > 28.4  --  28.3  --  29.3 29.6 29.0  MCHC 30.1  --  30.1  < > 31.2  --  32.5  --  33.3 33.5 32.6  RDW 15.0  --  15.0  < > 15.0  --  15.8*  --  15.6* 16.0* 15.2  LYMPHSABS 1.3  --  1.1  --   --   --   --   --   --   --   --   MONOABS 2.0*  --  1.7*  --   --   --   --   --   --   --   --   EOSABS 0.1  --  0.0  --   --   --   --   --   --   --   --   BASOSABS 0.0  --  0.0  --   --   --   --   --   --   --   --   < > = values in this interval not displayed.  Chemistries   Recent Labs Lab 02/14/15 2213 02/15/15 0645 02/16/15 0725 02/17/15 0528 02/18/15 0452 02/19/15 0525  NA 140 138 137 140 138 137  K 4.2 4.3 5.0 4.7 4.4 3.5  CL 105 107 109 110 110 105  CO2 25 22 23 22 22 23   GLUCOSE 117* 159* 224* 192* 176* 189*  BUN 43* 34* 50* 36* 19 12  CREATININE 1.31* 0.92 0.92 0.77 0.67 0.58*  CALCIUM 7.4* 7.4* 7.4* 7.5* 7.3* 7.5*  MG 1.7  --   --   --   --   --   AST 22 17  --  19 22 24   ALT 19 17  --  17 19 20   ALKPHOS 78 65  --  61 62 71  BILITOT 0.7 0.5  --  0.5 0.6 0.5   Coagulation profile  Recent Labs Lab 02/14/15 2213  INR 1.42   Cardiac Enzymes  Recent Labs Lab 02/14/15 2213 02/15/15 0645  TROPONINI <0.03  <0.03 <0.03   ------------------------------------------------------------------------------------------------------------------ Invalid input(s): POCBNP   CBG:  Recent Labs Lab 02/18/15 1154 02/18/15 1638 02/18/15 2053 02/19/15 0739 02/19/15 1129  GLUCAP 167* 164* 168* 143* 146*   Studies: No results found.    Lab Results  Component Value Date   HGBA1C 7.1* 02/14/2015   HGBA1C 7.0* 02/10/2015   HGBA1C 6.1* 09/29/2014   Lab Results  Component Value Date   CREATININE 0.58* 02/19/2015       Scheduled  Meds: . sodium chloride   Intravenous Once  . antiseptic oral rinse  7 mL Mouth Rinse BID  . atorvastatin  10 mg Oral Daily  . budesonide (PULMICORT) nebulizer solution  0.25 mg Nebulization BID  . ceFEPime (MAXIPIME) IV  2 g Intravenous Q12H  . Chlorhexidine Gluconate Cloth  6 each Topical Q0600  . diltiazem  30 mg Oral 4 times per day  . DULoxetine  30 mg Oral QPM  . feeding supplement (ENSURE ENLIVE)  237 mL Oral BID BM  . feeding supplement (GLUCERNA SHAKE)  237 mL Oral TID BM  . ferrous sulfate  325 mg Oral BID WC  . fluticasone  2 spray Each Nare Daily  . gabapentin  100 mg Oral QHS  . insulin aspart  0-9 Units Subcutaneous TID WC  . latanoprost  1 drop Both Eyes QPM  . levothyroxine  50 mcg Oral QAC breakfast  . mupirocin ointment  1 application Nasal BID  . pantoprazole (PROTONIX) IV  40 mg Intravenous Q12H  . polyethylene glycol  17 g Oral Daily  . vancomycin  1,000 mg Intravenous Q12H   Continuous Infusions: . sodium chloride 50 mL/hr at 02/19/15 1231    Principal Problem:   Sepsis ([place]) Active Problems:   Type 2 diabetes mellitus ([place])   Hypothyroidism   Hypertension   Hyperlipidemia   History of depression   COPD with acute exacerbation (HCC)   Low back pain radiating to left leg   DNR (do not resuscitate)   Palliative care encounter   Weakness generalized    Time spent: 30 minutes   Barton Dubois  [facility] Pager [phone]. If 7PM-7AM, please contact night-coverage at [email or web address], password [facility] 02/19/2015, 2:02 PM  LOS: 5 days

## 2015-02-19 NOTE — Progress Notes (Signed)
I was called by Nursing that Oncology was requesting an EGD.  His oxygenation is better at this time in that he is on 1 L [place] and his saturation ranges from 94-97%.  He is winded with speech.  My previous thought was that he could undergo the EGD even though he was high risk.  However, overnight his respiratory status changed rapidly.  I am reluctant to pursue the EGD at this time as he demonstrated a very tenuous nature from a respiratory stand point.  Bleeding is an issue, but I cannot arrest the bleeding if this is a malignant source.  If it is from PUD it can be resolved with intensive PPI treatment.  The safer route is to pursue biopsy of the bone lesions.

## 2015-02-19 NOTE — Progress Notes (Signed)
Pt noted to be in A. Fib rate controlled. No complaints from pt at this time. Will continue.   Ruben Reason, RN

## 2015-02-20 ENCOUNTER — Inpatient Hospital Stay (HOSPITAL_COMMUNITY): Payer: Medicare Other

## 2015-02-20 ENCOUNTER — Encounter (HOSPITAL_COMMUNITY): Payer: Self-pay | Admitting: Radiology

## 2015-02-20 DIAGNOSIS — M899 Disorder of bone, unspecified: Secondary | ICD-10-CM | POA: Insufficient documentation

## 2015-02-20 DIAGNOSIS — W19XXXA Unspecified fall, initial encounter: Secondary | ICD-10-CM | POA: Insufficient documentation

## 2015-02-20 DIAGNOSIS — D62 Acute posthemorrhagic anemia: Secondary | ICD-10-CM | POA: Insufficient documentation

## 2015-02-20 DIAGNOSIS — J9611 Chronic respiratory failure with hypoxia: Secondary | ICD-10-CM

## 2015-02-20 LAB — GLUCOSE, CAPILLARY
GLUCOSE-CAPILLARY: 143 mg/dL — AB (ref 65–99)
GLUCOSE-CAPILLARY: 278 mg/dL — AB (ref 65–99)
Glucose-Capillary: 199 mg/dL — ABNORMAL HIGH (ref 65–99)
Glucose-Capillary: 102 mg/dL — ABNORMAL HIGH (ref 65–99)

## 2015-02-20 LAB — CBC
HEMATOCRIT: 27.7 % — AB (ref 39.0–52.0)
HEMOGLOBIN: 8.8 g/dL — AB (ref 13.0–17.0)
MCH: 28.8 pg (ref 26.0–34.0)
MCHC: 31.8 g/dL (ref 30.0–36.0)
MCV: 90.5 fL (ref 78.0–100.0)
Platelets: 152 10*3/uL (ref 150–400)
RBC: 3.06 MIL/uL — AB (ref 4.22–5.81)
RDW: 16.2 % — ABNORMAL HIGH (ref 11.5–15.5)
WBC: 16.9 10*3/uL — ABNORMAL HIGH (ref 4.0–10.5)

## 2015-02-20 MED ORDER — FENTANYL CITRATE (PF) 100 MCG/2ML IJ SOLN
INTRAMUSCULAR | Status: AC
  Filled 2015-02-20: qty 4

## 2015-02-20 MED ORDER — MIDAZOLAM HCL 2 MG/2ML IJ SOLN
INTRAMUSCULAR | Status: AC | PRN
  Administered 2015-02-20: 1 mg via INTRAVENOUS

## 2015-02-20 MED ORDER — LIDOCAINE HCL 1 % IJ SOLN
INTRAMUSCULAR | Status: AC
  Filled 2015-02-20: qty 20

## 2015-02-20 MED ORDER — FENTANYL CITRATE (PF) 100 MCG/2ML IJ SOLN
INTRAMUSCULAR | Status: AC | PRN
  Administered 2015-02-20: 50 ug via INTRAVENOUS

## 2015-02-20 MED ORDER — MIDAZOLAM HCL 2 MG/2ML IJ SOLN
INTRAMUSCULAR | Status: AC
  Filled 2015-02-20: qty 4

## 2015-02-20 NOTE — Progress Notes (Signed)
IP PROGRESS NOTE   This encounter occurred on 02/19/2015.  Subjective:   Events noted in the last 24 hours patient clinically appeared stable. He is comfortable and evening this morning. He reports no increased pain. His respiratory status appears stable without any dyspnea, cough or wheezing.  Objective:  Vital signs in last 24 hours: Temp:  [97.5 F (36.4 C)-98.5 F (36.9 C)] 98.3 F (36.8 C) (01/16 0725) Pulse Rate:  [73-100] 73 (01/16 0624) Resp:  [16-24] 19 (01/16 0624) BP: (108-129)/(51-67) 120/57 mmHg (01/16 0624) SpO2:  [94 %-98 %] 96 % (01/16 0624) Weight:  [212 lb (96.163 kg)-212 lb 6.4 oz (96.344 kg)] 212 lb (96.163 kg) (01/16 0435) Weight change:  Last BM Date: 02/19/15  Intake/Output from previous day: 01/15 0701 - 01/16 0700 In: 1880.8 [P.O.:600; I.V.:1030.8; IV Piggyback:250] Out: 2375 [Urine:2375]  Alert, awake gentleman appeared without distress. Mouth: mucous membranes moist, pharynx normal without lesions Resp: clear to auscultation bilaterally Cardio: Regular rhythm. GI: soft, non-tender; bowel sounds normal; no masses,  no organomegaly Extremities: extremities normal, atraumatic, no cyanosis or edema    Lab Results:  Recent Labs  02/19/15 0525 02/20/15 0319  WBC 17.1* 16.9*  HGB 9.2* 8.8*  HCT 28.2* 27.7*  PLT 152 152    BMET  Recent Labs  02/18/15 0452 02/19/15 0525  NA 138 137  K 4.4 3.5  CL 110 105  CO2 22 23  GLUCOSE 176* 189*  BUN 19 12  CREATININE 0.67 0.58*  CALCIUM 7.3* 7.5*    Studies/Results: No results found.  Medications: I have reviewed the patient's current medications.   Assessment/Plan:  82 year old gentleman with the following issues:  1. Mixed lytic and sclerotic lesions noted on her CT scan on 02/15/2014. Previous imaging studies have been reviewed including MRI of the spine on 03/02/2014, skeletal survey in April 2016 as well as bone scan in January 2016. It appeared that he had very small lesion with  in the posterior body of S1. Other than that, all imaging studies did not show any evidence of sclerotic or lytic lesions.  His serum protein electrophoresis from 02/15/2015 showed no monoclonal spike with hypoalbuminemia.  PSA is low 0.02 making prostate cancer less of a possibility.  Endoscopy could not be performed due to risks related to conscious sedation.  The next step would be biopsying the lytic bone lesion at S spine for diagnostic purposes. I will consult interventional radiology to discuss with the patient this procedure.   2. Anemia: Likely related to GI bleed could be worsened by malignancy. I agree with the current approach and transfusions as you are doing.   3. Community-acquired pneumonia: Currently on antibiotics with stable respiratory status at this time. He is oxygenating her percent with very minimal oxygen supplements.    All of his questions and his family's questions were answered today to their satisfaction.   LOS: 6 days   MVHQIO,NGEXB 02/20/2015, 7:29 AM

## 2015-02-20 NOTE — Procedures (Signed)
Successful CT guided LEFT ILIAC BONE LESION CORE BX NO COMP STABLE PATH PENDING EBL 0 FULL REPORT IN PACS

## 2015-02-20 NOTE — Evaluation (Signed)
Occupational Therapy Evaluation Patient Details Name: Taylor [name] MRN: [id number] DOB: 10-09-1928 Today's Date: 02/20/2015    History of Present Illness 82 year old male with a history of paroxysmal atrial fibrillation, COPD, on nocturnal oxygen 2.5 L, diabetes mellitus, hypertension, recently admitted 1/6-1/8 for community-acquired pneumonia, left lower lobe, discharged home, who presents with weakness, persistent nausea, syncopal episode at home, and a fall. Pt with suspected bone mets, for biopsy 02/20/15, palliative consult.   Clinical Impression   At his baseline, pt can perform self care and ambulate with a cane.  Pt was using a RW since his recent hospital discharge.  Presents with poor activity tolerance, limited L shoulder use, generalized weakness and decreased standing balance interfering with ability to perform ADL and mobility.  Will follow acutely. Pt will need post acute rehab prior to return home.   Follow Up Recommendations  SNF    Equipment Recommendations       Recommendations for Other Services       Precautions / Restrictions Precautions Precautions: Fall Precaution Comments: back for comfort Restrictions Weight Bearing Restrictions: No      Mobility Bed Mobility Overal bed mobility: Needs Assistance Bed Mobility: Sit to Sidelying;Rolling Rolling: Min guard       Sit to sidelying: Min assist;Mod assist General bed mobility comments: assisted LEs up on to bed, instructed in log roll to protect back  Transfers Overall transfer level: Needs assistance Equipment used: Rolling walker (2 wheeled) Transfers: Sit to/from Stand Sit to Stand: Mod assist         General transfer comment: assist to scoot to edge of chair and to rise from recliner    Balance     Sitting balance-Leahy Scale: Good       Standing balance-Leahy Scale: Fair                              ADL Overall ADL's : Needs assistance/impaired Eating/Feeding: Set  up;Sitting (currently NPO for test)   Grooming: Wash/dry hands;Wash/dry face;Set up;Sitting   Upper Body Bathing: Sitting;Moderate assistance   Lower Body Bathing: Maximal assistance;Sit to/from stand   Upper Body Dressing : Minimal assistance;Sitting   Lower Body Dressing: Maximal assistance;Sit to/from stand   Toilet Transfer: Minimal assistance;Stand-pivot;RW;BSC   Toileting- Clothing Manipulation and Hygiene: Maximal assistance;Sit to/from stand               Vision     Perception     Praxis      Pertinent Vitals/Pain Pain Assessment: Faces Faces Pain Scale: Hurts little more Pain Location: L shoulder Pain Descriptors / Indicators: Grimacing;Guarding Pain Intervention(s): Limited activity within patient's tolerance;Monitored during session;Repositioned     Hand Dominance Right   Extremity/Trunk Assessment Upper Extremity Assessment Upper Extremity Assessment: LUE deficits/detail LUE Deficits / Details: shoulder pain, limited ROM, needs further assessment LUE: Unable to fully assess due to pain LUE Coordination: decreased gross motor   Lower Extremity Assessment Lower Extremity Assessment: Defer to PT evaluation   Cervical / Trunk Assessment Cervical / Trunk Assessment: Kyphotic   Communication Communication Communication: No difficulties   Cognition Arousal/Alertness: Awake/alert Behavior During Therapy: WFL for tasks assessed/performed Overall Cognitive Status: Within Functional Limits for tasks assessed                     General Comments       Exercises       Shoulder Instructions      Home  Living Family/patient expects to be discharged to:: Unsure Living Arrangements: Children Available Help at Discharge: Available PRN/intermittently Type of Home: House Home Access: Stairs to enter [employer] of Steps: 2 Entrance Stairs-Rails: Right Home Layout: One level               Home Equipment: Walker - 2  wheels;Cane - single point;Bedside commode;Shower seat          Prior Functioning/Environment Level of Independence: Independent with assistive device(s)        Comments: walker to ambulate since his previous recent admission. Could bath dress self    OT Diagnosis: Generalized weakness;Acute pain   OT Problem List: Decreased strength;Decreased activity tolerance;Decreased range of motion;Impaired balance (sitting and/or standing);Decreased knowledge of use of DME or AE;Cardiopulmonary status limiting activity;Obesity;Pain;Impaired UE functional use   OT Treatment/Interventions: Self-care/ADL training;Therapeutic exercise;DME and/or AE instruction;Patient/family education;Therapeutic activities;Energy conservation    OT Goals(Current goals can be found in the care plan section) Acute Rehab OT Goals Patient Stated Goal: Get help moving before I return home OT Goal Formulation: With patient Time For Goal Achievement: 03/06/15 Potential to Achieve Goals: Fair ADL Goals Pt Will Perform Grooming: with min guard assist;standing (2 activities) Pt Will Perform Upper Body Bathing: with min assist;sitting Pt Will Perform Upper Body Dressing: with set-up;sitting Pt Will Transfer to Toilet: with min guard assist;ambulating;bedside commode (over toilet) Pt Will Perform Toileting - Clothing Manipulation and hygiene: with min guard assist;sit to/from stand Pt/caregiver will Perform Home Exercise Program: Increased ROM;Increased strength;Left upper extremity;Independently Additional ADL Goal #1: Pt will generalize energy conservation strategies in ADL and mobility with minimal verbal cues.  OT Frequency: Min 2X/week   Barriers to D/C: Decreased caregiver support          Co-evaluation              End of Session Equipment Utilized During Treatment: Gait belt;Rolling walker  Activity Tolerance: Patient limited by fatigue Patient left: in bed;with call bell/phone within reach;with bed  alarm set;with family/visitor present   Time: 4888-9169 OT Time Calculation (min): 24 min Charges:  OT General Charges $OT Visit: 1 Procedure OT Evaluation $OT Eval Moderate Complexity: 1 Procedure OT Treatments $Self Care/Home Management : 8-22 mins G-Codes:    Malka So 02/20/2015, 12:39 PM  [phone]

## 2015-02-20 NOTE — Progress Notes (Signed)
Pharmacy Antibiotic Follow-up Note  Taylor [name] is a 82 y.o. year-old male admitted on 02/14/2015.  The patient is currently on day 7 of vancomycin and Zosyn for sepsis- HCAP.  WBC down to 16.9, afebrile. PCT and LA down.   Assessment/Plan: This patient's current antibiotics will be continued without adjustments.  Please consider stopping antibiotics due to completed course either today or tomorrow.   Temp (24hrs), Avg:98.1 F (36.7 C), Min:97.5 F (36.4 C), Max:98.5 F (36.9 C)   Recent Labs Lab 02/16/15 1010 02/17/15 0528 02/18/15 0452 02/19/15 0525 02/20/15 0319  WBC 25.0* 21.3* 18.8* 17.1* 16.9*    Recent Labs Lab 02/15/15 0645 02/16/15 0725 02/17/15 0528 02/18/15 0452 02/19/15 0525  CREATININE 0.92 0.92 0.77 0.67 0.58*   Estimated Creatinine Clearance: 75.9 mL/min (by C-G formula based on Cr of 0.58).    Allergies  Allergen Reactions  . Amoxicillin Palpitations  . Motrin [Ibuprofen] Palpitations  . Chantix [Varenicline Tartrate] Other (See Comments)    depression    Antimicrobials this admission: Cefepime 1/10 >> Vancomycin 1/10>> Flagyl IV 1/12>>1/15? CHG/Bactroban 1/12>> (1/16)  Levels/dose changes this admission: none  Microbiology results: 1/6 Blood cx x 2 - neg 1/10 Blood cx x 2 - neg 1/10 Urine (repeat 2212) - neg 1/10 urine (1841)- insignificant growth 1/7 MRSA screen POS  Thank you for allowing pharmacy to be a part of this patient's care.  Sloan Leiter, PharmD, BCPS Clinical Pharmacist [phone]  02/20/2015 2:40 PM

## 2015-02-20 NOTE — Sedation Documentation (Signed)
Patient denies pain and is resting comfortably.  

## 2015-02-20 NOTE — Care Management Important Message (Signed)
Important Message  Patient Details  Name: Taylor [name] MRN: [id number] Date of Birth: October 14, 1928   Medicare Important Message Given:  Yes    Bethena Roys, RN 02/20/2015, 3:33 PM

## 2015-02-20 NOTE — Progress Notes (Signed)
[name] PROGRESS NOTE  Taylor [name] JGO:[id number] DOB: [birth year] DOA: 02/14/2015 PCP: [name], MD  Length of stay: 6   Assessment/Plan: Principal Problem:   Sepsis ([place]) Active Problems:   Type 2 diabetes mellitus ([place])   Hypothyroidism   Hypertension   Hyperlipidemia   History of depression   COPD with acute exacerbation ()   Low back pain radiating to left leg   DNR (do not resuscitate)   Palliative care encounter   Weakness generalized    HPI:  82 year old male with a history of paroxysmal atrial fibrillation on xarelto, COPD, on nocturnal oxygen 2.5 L, diabetes mellitus, hypertension, recently admitted 1/6-1/8 for community-acquired pneumonia, left lower lobe, discharged home on levofloxacin for 7 days, who presents with weakness, persistent nausea, syncopal episode at home, and the patient remembers hitting the back of his head. Patient was found on the floor by his family, daughter brings him in states that patient has been weak and bed bound since his discharge. Due to nausea the patient has not been eating or drinking.   Patient has a history of chronic low back pain, followed by Dr. [name], had an MRI 2 weeks ago, was found to have lesions concerning for metastatic disease and was referred to Dr. [name] for further evaluation. Patient has been evaluated by oncology Dr. [name] during this admission. He had a face-to-face meeting with the family on 1/14.  On arrival systolic blood pressure was 64 /34 in the ER on 1/10. Sepsis protocol was initiated. White count appears to be persistently high at 17.9. Hemoglobin has dropped from 10.4>9.2. Creatinine has increased from 0.7>2.03.  GI was consulted because the patient was noted to have 2 episodes of black tarry stools. Dr. [name] will not attempt EGD due to tenuous pulmonary status. Plan is for IR to due bone biopsy. Continue current antibiotic therapy and follow clinical response.  Overall improving.   Assessment and plan Sepsis with hypovolemic shock, hemorrhagic shock  Resolved  -Likely in the setting of recent left lower lobe pneumonia with incomplete resolution, ongoing bleeding  -White count still elevated, but trending down -lactate on admit 2.86 >1.6 now -Pro calcitonin <0.10 -Blood culture from 1/6 , 1/10 remain negative -Currently on day #7 of vancomycin and cefepime for sepsis coverage -per Dr. [name] who recently performed MRI of the patient's lumbar spine and there was no evidence of discitis -CT chest showed left lower lobe pneumonia  Syncope likely secondary to orthostatic hypotension in the setting of pneumonia/sepsis, GI bleeding, underlying malignancy. -negative  troponin, 2-D echo [showed EF of 60-65%], doubt PE, as patient was getting xarelto prior to admission -CT head negative  -Will monitor on tele  Community-acquired pneumonia (diagnosed 1/6) with underlying history of COPD, chronic respiratory failure -Continue antibiotics as above for 2 more days. -Previous Influenza panel negative, urine strep antigen negative.  -Previous Pro calcitonin less than 0.1, lactic acid 1.0.  -Previous Blood cultures remain negative to date. -will continue neb treatments and added pulmicort -oxygen supplementation off and patient with good saturation    Hyperkalemia  -Previously K+ 6.5 , secondary to dehydration -resolved    Severe COPD with chronic respiratory failure -Using 1-2 Lpm supplemental O2 at night while home  -will continue duo nebs, wean O2 as tolerated to baseline -will continue flutter valve and pulmicort   Type II DM  - Holding home metformin while inpt  - Continue sliding scale insulin, hemoglobin A1c 7.0  Fall -given that the patient is on xarelto  CT head done to  rule out intracranial bleeding. -neg images studies for acute abnormalities  -PT evaluation requested   Normocytic Anemia , likely now with acute blood  loss anemia, malignancy  -Hemoglobin was 10.8 on 1/6, dropped to 6.7 on 1/ 11,status  posttransfusion of 5 units of packed red blood cells (last transfusion on 1/14) -Discontinued xarelto,   -will follow Hgb trend (last Hgb 1/16 was 8.8) -CT abd/pelvis showed possible inflammation of the distal stomach and proximal duodenum,unable to do EGD. -CT also showed lytic and sclerotic lesions in the spine and ribs and pelvis suspicious for metastatic disease or multiple myeloma  -Repeat  SPEP /UPEP,Pending, oncology following  -plan is for bone biopsy by IR -will follow rec's -continue IV protonix BID now (had received 3 days of PPI drip)  Paroxysmal atrial fib  -discontinued  Xarelto due to bleeding -continue cardizem with holding parameters for rate control -CHADS vasc 2, patient was not on any anticoagulation prior to his left knee total arthroplasty and was placed on xarelto for DVT prophylaxis.   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 [name] has talked with IR for bone biopsy -no safe to have EGD per Dr. Benson Norway    DVT: SCD's  Code Status:      Code Status Orders        Start     Ordered   02/14/15 1720  Full code   Continuous     02/14/15 1721    Resolved   Family Communication: Discussed in detail with the patient, all imaging results, lab results explained to the patient   Disposition Plan:  Continue inpatient status; will continue PPI IV; planning bone biopsy by IR. Will follow clinical response. Patient weak and deconditioned.  Consultants:  GI  Oncologist  IR  Procedures:  None  Antibiotics: Anti-infectives    Start     Dose/Rate Route Frequency Ordered Stop   02/16/15 0900  metroNIDAZOLE (FLAGYL) IVPB 500 mg  Status:  Discontinued     500 mg 100  mL/hr over 60 Minutes Intravenous Every 8 hours 02/16/15 0831 02/19/15 1220   02/15/15 0400  vancomycin (VANCOCIN) IVPB 1000 mg/200 mL premix     1,000 mg 200 mL/hr over 60 Minutes Intravenous Every 12 hours 02/14/15 1457     02/15/15 0330  ceFEPIme (MAXIPIME) 2 g in dextrose 5 % 50 mL IVPB     2 g 100 mL/hr over 30 Minutes Intravenous Every 12 hours 02/14/15 1457     02/14/15 1530  vancomycin (VANCOCIN) 1,750 mg in sodium chloride 0.9 % 500 mL IVPB     1,750 mg 250 mL/hr over 120 Minutes Intravenous  Once 02/14/15 1449 02/14/15 1917   02/14/15 1500  ceFEPIme (MAXIPIME) 2 g in dextrose 5 % 50 mL IVPB     2 g 100 mL/hr over 30 Minutes Intravenous  Once 02/14/15 1446 02/14/15 1613   02/14/15 1500  vancomycin (VANCOCIN) IVPB 1000 mg/200 mL premix  Status:  Discontinued     1,000 mg 200 mL/hr over 60 Minutes Intravenous  Once 02/14/15 1446 02/14/15 1449      HPI/Subjective: Tachycardia improved with transfusion. Patient breathing much better and denies CP. Felling tired/weak.    Objective: Filed Vitals:   02/20/15 7341 02/20/15  0725 02/20/15 0840 02/20/15 1120  BP: 120/57   114/56  Pulse: 73   89  Temp:  98.3 F (36.8 C)  98 F (36.7 C)  TempSrc:  Oral  Oral  Resp: 19   21  Height:      Weight:      SpO2: 96%  96% 95%    Intake/Output Summary (Last 24 hours) at 02/20/15 1525 Last data filed at 02/20/15 1215  Gross per 24 hour  Intake   1610 ml  Output   1725 ml  Net   -115 ml    Exam:  General: afebrile, feeling better and breathing ok. No CP. Currently with some abd pain; No acute respiratory distress. Hgb stable and denies bloody stools Lungs: good air movement; scattered rhonchi and very mild exp wheezing  Cardiovascular: rate controlled, no rubs or gallops; no JVD Abdomen: tender to palpation on left mid abdomen; positive BS, no guarding Extremities: No significant cyanosis or clubbing on bilateral lower extremities; trace edema appreciated  Data Review   Micro  Results Recent Results (from the past 240 hour(s))  Culture, blood (routine x 2) Call MD if unable to obtain prior to antibiotics being given     Status: None   Collection Time: 02/10/15  9:55 PM  Result Value Ref Range Status   Specimen Description BLOOD LEFT ANTECUBITAL  Final   Special Requests BOTTLES DRAWN AEROBIC AND ANAEROBIC 5CC  Final   Culture NO GROWTH 5 DAYS  Final   Report Status 02/15/2015 FINAL  Final  Culture, blood (routine x 2) Call MD if unable to obtain prior to antibiotics being given     Status: None   Collection Time: 02/10/15 10:02 PM  Result Value Ref Range Status   Specimen Description BLOOD RIGHT ARM  Final   Special Requests IN PEDIATRIC BOTTLE [place]  Final   Culture NO GROWTH 5 DAYS  Final   Report Status 02/15/2015 FINAL  Final  MRSA PCR Screening     Status: Abnormal   Collection Time: 02/11/15  4:48 AM  Result Value Ref Range Status   MRSA by PCR POSITIVE (A) NEGATIVE Final    Comment:        The GeneXpert MRSA Assay (FDA approved for NASAL specimens only), is one component of a comprehensive MRSA colonization surveillance program. It is not intended to diagnose MRSA infection nor to guide or monitor treatment for MRSA infections. RESULT CALLED TO, READ BACK BY AND VERIFIED WITH: R HANEY @0649  02/05/15 MKELLY   Blood Culture (routine x 2)     Status: None   Collection Time: 02/14/15  3:05 PM  Result Value Ref Range Status   Specimen Description BLOOD LEFT HAND  Final   Special Requests BOTTLES DRAWN AEROBIC AND ANAEROBIC 4CC  Final   Culture NO GROWTH 5 DAYS  Final   Report Status 02/19/2015 FINAL  Final  Blood Culture (routine x 2)     Status: None   Collection Time: 02/14/15  3:30 PM  Result Value Ref Range Status   Specimen Description BLOOD LEFT WRIST  Final   Special Requests BOTTLES DRAWN AEROBIC AND ANAEROBIC 5CC  Final   Culture NO GROWTH 5 DAYS  Final   Report Status 02/19/2015 FINAL  Final  Urine culture     Status: None    Collection Time: 02/14/15  6:41 PM  Result Value Ref Range Status   Specimen Description URINE, RANDOM  Final   Special Requests NONE  Final  Culture 3,000 COLONIES/mL INSIGNIFICANT GROWTH  Final   Report Status 02/15/2015 FINAL  Final  Urine culture     Status: None   Collection Time: 02/14/15 10:12 PM  Result Value Ref Range Status   Specimen Description URINE, RANDOM  Final   Special Requests NONE  Final   Culture NO GROWTH 2 DAYS  Final   Report Status 02/16/2015 FINAL  Final  Urine culture     Status: None   Collection Time: 02/16/15  7:20 AM  Result Value Ref Range Status   Specimen Description URINE, RANDOM  Final   Special Requests NONE  Final   Culture NO GROWTH 1 DAY  Final   Report Status 02/17/2015 FINAL  Final   Radiology Reports Ct Abdomen Pelvis Wo Contrast  02/16/2015  CLINICAL DATA:  Anemia, leukocytosis and tarry stools. Recent admission for sepsis. History of COPD, diabetes and hypertension. History of bladder cancer and plasma cell disorder. EXAM: CT CHEST, ABDOMEN AND PELVIS WITHOUT CONTRAST TECHNIQUE: Multidetector CT imaging of the chest, abdomen and pelvis was performed following the standard protocol without IV contrast. COMPARISON:  Chest CT 12/15/2007, abdominal pelvic CT 06/30/2014 and chest radiographs 02/10/2015. Skeletal survey 05/24/2014. FINDINGS: CT CHEST Mediastinum/Nodes: There are no enlarged mediastinal, hilar or axillary lymph nodes. Small mediastinal lymph nodes are stable. The thyroid gland, trachea and esophagus demonstrate no significant findings. The heart size is normal. There is no pericardial effusion. There is extensive atherosclerosis of the aorta, great vessels and coronary arteries. Lungs/Pleura: There is minimal dependent pleural fluid bilaterally. As demonstrated on recent radiographs, there is airspace disease with consolidation and air bronchograms in the left lower lobe. There is minimal dependent atelectasis at the right lung base. A 6  mm left upper lobe nodule on image number 16 is stable. There are no new or enlarging pulmonary nodules. Mild emphysematous changes are present. Musculoskeletal/Chest wall: There is a T9 compression fracture which appears new and likely pathologic. There are multiple irregular lucent and sclerotic lesions throughout the thoracic spine and ribs. Small purely lytic lesion noted anteriorly in the left third rib (image 21). CT ABDOMEN AND PELVIS FINDINGS Hepatobiliary: As evaluated in the noncontrast state, the liver appears stable without suspicious findings. Small calcified gallstones are again noted. There is no gallbladder wall thickening or significant biliary dilatation. Pancreas: Unremarkable. No pancreatic ductal dilatation or surrounding inflammatory changes. Spleen: Normal in size without focal abnormality. Adrenals/Urinary Tract: Chronic right adrenal mass appears unchanged, measuring 3.4 x 3.1 cm on image 51. This contains calcifications, soft tissue and fatty components. 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 The left adrenal gland appears normal. Multiple bilateral renal cysts are again noted, measuring up to 6.0 cm in the lower pole of the right kidney and up to 5.8 cm in the interpolar region of the left kidney. Both kidneys demonstrate mild cortical thinning. There is no evidence of urinary tract calculus or hydronephrosis. The bladder appears normal. Stomach/Bowel: There appears to be mild soft tissue stranding around the distal stomach, proximal duodenum and pancreatic head. This is suboptimally evaluated due to the lack of intravenous and oral contrast. There is no extraluminal fluid collection. No evidence of bowel wall thickening, distention or surrounding inflammatory change. Stable diverticular changes of the sigmoid colon. Vascular/Lymphatic: There are no enlarged abdominal or pelvic lymph nodes. Stable extensive atherosclerosis of the aorta, its  branches and the iliac arteries. There is stable dilatation of the right common iliac artery. Reproductive: Unremarkable.  Other: Stable asymmetric fat in the left inguinal canal and stable small umbilical hernia containing only fat. No evidence of ascites. Musculoskeletal: There are several new mixed lytic and sclerotic lesions within the lumbar spine. There is a dominant 2.2 cm sclerotic lesion in the S1 segment. Postsurgical changes are noted in the lower lumbar spine. There is no evidence of lumbar pathologic fracture or epidural tumor. IMPRESSION: 1. Possible inflammation involving the distal stomach and proximal duodenum in this patient with tarry stools. This is suboptimally evaluated by this examination. Consider endoscopic correlation. 2. The remainder of the small bowel and colon demonstrate no significant findings. There is distal colonic diverticulosis. 3. Left lower lobe pneumonia.  Small bilateral pleural effusions. 4. Multiple lytic and sclerotic lesions in the spine, ribs and pelvis, suspicious for metastatic disease or multiple myeloma. Correlate clinically. Probable pathologic fracture at T9. 5. Stable additional incidental findings including extensive atherosclerosis, cholelithiasis and a chronic right adrenal mass. Electronically Signed   By: Richardean Sale M.D.   On: 02/16/2015 09:45   Dg Chest 2 View  02/10/2015  CLINICAL DATA:  Left-sided chest pain in the mornings for the last few days. EXAM: CHEST  2 VIEW COMPARISON:  04/12/2011 FINDINGS: Heart size is normal. There is atherosclerosis of the aorta. The right lung is clear. There is abnormal density in the left lower lobe posteriorly most consistent with bronchopneumonia. Follow-up to clearing recommended to ensure there is not a mass lesion. No effusion. No acute bone finding. IMPRESSION: Left lower lobe infiltrate most consistent with bronchopneumonia. Follow-up to clearing to rule out underlying mass. Electronically Signed   By: Nelson Chimes M.D.   On: 02/10/2015 15:12   Ct Head Wo Contrast  02/14/2015  CLINICAL DATA:  Syncope and headache. EXAM: CT HEAD WITHOUT CONTRAST TECHNIQUE: Contiguous axial images were obtained from the base of the skull through the vertex without intravenous contrast. 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 Stable cortical atrophy. The brain demonstrates no evidence of hemorrhage, acute infarction, edema, mass effect, extra-axial fluid collection, hydrocephalus or mass lesion. The skull is unremarkable. IMPRESSION: No acute findings. 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 Electronically Signed   By: Aletta Edouard M.D.   On: 02/14/2015 17:51   Ct Chest Wo Contrast  02/16/2015  CLINICAL DATA:  Anemia, leukocytosis and tarry stools. Recent admission for sepsis. History of COPD, diabetes and hypertension. History of bladder cancer and plasma cell disorder. EXAM: CT CHEST, ABDOMEN AND PELVIS WITHOUT CONTRAST TECHNIQUE: Multidetector CT imaging of the chest, abdomen and pelvis was performed following the standard protocol without IV contrast. COMPARISON:  Chest CT 12/15/2007, abdominal pelvic CT 06/30/2014 and chest radiographs 02/10/2015. Skeletal survey 05/24/2014. FINDINGS: CT CHEST Mediastinum/Nodes: There are no enlarged mediastinal, hilar or axillary lymph nodes. Small mediastinal lymph nodes are stable. The thyroid gland, trachea and esophagus demonstrate no significant findings. The heart size is normal. There is no pericardial effusion. There is extensive atherosclerosis of the aorta, great vessels and coronary arteries. Lungs/Pleura: There is minimal dependent pleural fluid bilaterally. As demonstrated on recent radiographs, there is airspace disease with consolidation and air bronchograms in the left lower lobe. There is minimal dependent atelectasis at the right lung base. A 6 mm left  upper lobe nodule on image number 16 is stable. There are no new or enlarging pulmonary nodules. Mild emphysematous changes are present. Musculoskeletal/Chest wall: There is  a T9 compression fracture which appears new and likely pathologic. There are multiple irregular lucent and sclerotic lesions throughout the thoracic spine and ribs. Small purely lytic lesion noted anteriorly in the left third rib (image 21). CT ABDOMEN AND PELVIS FINDINGS Hepatobiliary: As evaluated in the noncontrast state, the liver appears stable without suspicious findings. Small calcified gallstones are again noted. There is no gallbladder wall thickening or significant biliary dilatation. Pancreas: Unremarkable. No pancreatic ductal dilatation or surrounding inflammatory changes. Spleen: Normal in size without focal abnormality. Adrenals/Urinary Tract: Chronic right adrenal mass appears unchanged, measuring 3.4 x 3.1 cm on image 51. This contains calcifications, soft tissue and fatty components. 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 The left adrenal gland appears normal. Multiple bilateral renal cysts are again noted, measuring up to 6.0 cm in the lower pole of the right kidney and up to 5.8 cm in the interpolar region of the left kidney. Both kidneys demonstrate mild cortical thinning. There is no evidence of urinary tract calculus or hydronephrosis. The bladder appears normal. Stomach/Bowel: There appears to be mild soft tissue stranding around the distal stomach, proximal duodenum and pancreatic head. This is suboptimally evaluated due to the lack of intravenous and oral contrast. There is no extraluminal fluid collection. No evidence of bowel wall thickening, distention or surrounding inflammatory change. Stable diverticular changes of the sigmoid colon. Vascular/Lymphatic: There are no enlarged abdominal or pelvic lymph nodes. Stable extensive atherosclerosis of the aorta, its branches and  the iliac arteries. There is stable dilatation of the right common iliac artery. Reproductive: Unremarkable. Other: Stable asymmetric fat in the left inguinal canal and stable small umbilical hernia containing only fat. No evidence of ascites. Musculoskeletal: There are several new mixed lytic and sclerotic lesions within the lumbar spine. There is a dominant 2.2 cm sclerotic lesion in the S1 segment. Postsurgical changes are noted in the lower lumbar spine. There is no evidence of lumbar pathologic fracture or epidural tumor. IMPRESSION: 1. Possible inflammation involving the distal stomach and proximal duodenum in this patient with tarry stools. This is suboptimally evaluated by this examination. Consider endoscopic correlation. 2. The remainder of the small bowel and colon demonstrate no significant findings. There is distal colonic diverticulosis. 3. Left lower lobe pneumonia.  Small bilateral pleural effusions. 4. Multiple lytic and sclerotic lesions in the spine, ribs and pelvis, suspicious for metastatic disease or multiple myeloma. Correlate clinically. Probable pathologic fracture at T9. 5. Stable additional incidental findings including extensive atherosclerosis, cholelithiasis and a chronic right adrenal mass. Electronically Signed   By: Richardean Sale M.D.   On: 02/16/2015 09:45   Mr Abdomen Wo Contrast  02/17/2015  CLINICAL DATA:  Evaluate adrenal mass EXAM: MRI ABDOMEN WITHOUT CONTRAST TECHNIQUE: Multiplanar multisequence MR imaging was performed without the administration of intravenous contrast. COMPARISON:  02/16/2015 FINDINGS: Lower chest: Small left effusion and left lower lobe airspace consolidation is noted. Hepatobiliary: Mild hepatic steatosis. There is no focal liver abnormality. Stones are identified within the gallbladder. These measure up to 6 mm. No biliary dilatation. The common bile duct has a normal caliber. Pancreas: Pancreas is unremarkable. Spleen: Negative Adrenals/Urinary  Tract: Fat signal intensity within the 2.7 cm right adrenal mass is identified compatible with benign adrenal myelolipoma. Normal appearance of the left adrenal gland. Multiple bilateral renal cysts identified. The largest arises from the upper pole the right kidney measuring 6.3 cm. 5.5 cm cyst arises from the midpole of left kidney. No obstructive uropathy. Stomach/Bowel:  The stomach is normal. The visualized upper abdominal bowel loops have a normal caliber. Vascular/Lymphatic: Aortic atherosclerosis noted. No aneurysm. No upper abdominal adenopathy identified. Other: No ascites or focal fluid collections identified within the upper abdomen. Musculoskeletal: Inferior endplate lesion at the T9 level is again identified. Lesion involving the posterior aspect of the T12 vertebra and right posterior element is again identified suspicious for metastatic disease, image number 15 of series 4. IMPRESSION: 1. 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 2. Mild hepatic steatosis. 3. Gallstones 4. Aortic atherosclerosis 5. T12 bone lesions suspicious for metastatic disease. 6. T9 compression fracture. Electronically Signed   By: Kerby Moors M.D.   On: 02/17/2015 07:39   Dg Chest Port 1 View  02/14/2015  CLINICAL DATA:  Pneumonia. EXAM: PORTABLE CHEST 1 VIEW COMPARISON:  02/10/2015 FINDINGS: Left lower lobe infiltrate again identified. This may be slightly smaller and less dense compared to the prior chest x-ray. No edema or pleural fluid. The heart size and mediastinal contours are normal. IMPRESSION: Slightly smaller and less dense appearing left lower lobe pneumonia. Electronically Signed   By: Aletta Edouard M.D.   On: 02/14/2015 15:18     CBC  Recent Labs Lab 02/14/15 1530  02/14/15 2213  02/16/15 1010 02/16/15 2106 02/17/15 0528 02/18/15 0452 02/19/15 0525 02/20/15 0319  WBC 17.9*  --  17.5*  < > 25.0*  --  21.3* 18.8* 17.1* 16.9*  HGB 8.4*  < > 8.6*   < > 6.6* 8.5* 8.0* 7.1* 9.2* 8.8*  HCT 27.9*  < > 28.6*  < > 20.3* 25.2* 24.0* 21.2* 28.2* 27.7*  PLT 236  --  204  < > 198  --  169 126* 152 152  MCV 90.9  --  90.8  < > 87.1  --  87.9 88.3 89.0 90.5  MCH 27.4  --  27.3  < > 28.3  --  29.3 29.6 29.0 28.8  MCHC 30.1  --  30.1  < > 32.5  --  33.3 33.5 32.6 31.8  RDW 15.0  --  15.0  < > 15.8*  --  15.6* 16.0* 15.2 16.2*  LYMPHSABS 1.3  --  1.1  --   --   --   --   --   --   --   MONOABS 2.0*  --  1.7*  --   --   --   --   --   --   --   EOSABS 0.1  --  0.0  --   --   --   --   --   --   --   BASOSABS 0.0  --  0.0  --   --   --   --   --   --   --   < > = values in this interval not displayed.  Chemistries   Recent Labs Lab 02/14/15 2213 02/15/15 0645 02/16/15 0725 02/17/15 0528 02/18/15 0452 02/19/15 0525  NA 140 138 137 140 138 137  K 4.2 4.3 5.0 4.7 4.4 3.5  CL 105 107 109 110 110 105  CO2 25 22 23 22 22 23   GLUCOSE 117* 159* 224* 192* 176* 189*  BUN 43* 34* 50* 36* 19 12  CREATININE 1.31* 0.92 0.92 0.77 0.67 0.58*  CALCIUM 7.4* 7.4* 7.4* 7.5* 7.3* 7.5*  MG 1.7  --   --   --   --   --   AST 22 17  --  19 22 24   ALT 19 17  --  17 19 20   ALKPHOS 78 65  --  61 62 71  BILITOT 0.7 0.5  --  0.5 0.6 0.5   Coagulation profile  Recent Labs Lab 02/14/15 2213  INR 1.42   Cardiac Enzymes  Recent Labs Lab 02/14/15 2213 02/15/15 0645  TROPONINI <0.03  <0.03 <0.03    CBG:  Recent Labs Lab 02/19/15 1129 02/19/15 1705 02/19/15 2036 02/20/15 0720 02/20/15 1119  GLUCAP 146* 139* 160* 143* 199*   Studies: No results found.    Lab Results  Component Value Date   HGBA1C 7.1* 02/14/2015   HGBA1C 7.0* 02/10/2015   HGBA1C 6.1* 09/29/2014   Lab Results  Component Value Date   CREATININE 0.58* 02/19/2015    Scheduled Meds: . sodium chloride   Intravenous Once  . antiseptic oral rinse  7 mL Mouth Rinse BID  . atorvastatin  10 mg Oral Daily  . budesonide (PULMICORT) nebulizer solution  0.25 mg Nebulization BID   . ceFEPime (MAXIPIME) IV  2 g Intravenous Q12H  . diltiazem  30 mg Oral 4 times per day  . DULoxetine  30 mg Oral QPM  . feeding supplement (ENSURE ENLIVE)  237 mL Oral BID BM  . feeding supplement (GLUCERNA SHAKE)  237 mL Oral TID BM  . ferrous sulfate  325 mg Oral BID WC  . fluticasone  2 spray Each Nare Daily  . gabapentin  100 mg Oral QHS  . insulin aspart  0-9 Units Subcutaneous TID WC  . latanoprost  1 drop Both Eyes QPM  . levothyroxine  50 mcg Oral QAC breakfast  . mupirocin ointment  1 application Nasal BID  . pantoprazole (PROTONIX) IV  40 mg Intravenous Q12H  . polyethylene glycol  17 g Oral Daily  . vancomycin  1,000 mg Intravenous Q12H   Continuous Infusions: . sodium chloride 50 mL/hr at 02/19/15 2031    Principal Problem:   Sepsis ([place]) Active Problems:   Type 2 diabetes mellitus ([place])   Hypothyroidism   Hypertension   Hyperlipidemia   History of depression   COPD with acute exacerbation (HCC)   Low back pain radiating to left leg   DNR (do not resuscitate)   Palliative care encounter   Weakness generalized    Time spent: 30 minutes   Barton Dubois  [facility] Pager [phone]. If 7PM-7AM, please contact night-coverage at [email or web address], password [email or web address] 02/20/2015, 3:25 PM  LOS: 6 days

## 2015-02-20 NOTE — Progress Notes (Signed)
  Meet with patient and his daughter and grand-daughter at bedside as a follow-up visit from 02-17-15 [place] meeting.  Both patient and his daughter verbalize  "gut feeling" that "things are bad".  However they remain hopeful for  Improvement.  They await further testing and results before making decisions regarding treatment options and plan of care.    Emotional support offered.  Wadie Lessen NP  Palliative Medicine Team Team Phone # [phone] Pager [phone]  Total time spent on the unit was 25 minutes  Time in1505    Time out 1530  Greater than 50% time spent in counseling and coordination of care.

## 2015-02-20 NOTE — Progress Notes (Signed)
Chief Complaint: Patient was seen in consultation today for biopsy of bony lesion at the request of Dr. [name]  Referring Physician(s): Dr. Zola Button  History of Present Illness: Taylor [name] is a 82 y.o. male admitted with sepsis secondary from hemorrhagic shock in the setting of pneumonia and GI bleed. He has hx of afib, normally on Xarelto which has been stopped. His anemia and overall status is much improved. Incidentally on his scans, numerous bony lesions concerning for metastatic process were found.  IR is asked to perform bone biopsy to obtain tissue diagnosis. Chart, PMHx, meds, labs, imaging, allergies all reviewed. Pt resting in chest at present time, comfortable. No SOB, no O2 in use. Ate breakfast this am, about 0800  Past Medical History  Diagnosis Date  . Hypertension   . Hyperlipidemia   . Hypothyroidism   . COPD (chronic obstructive pulmonary disease) ([place])   . Atrial fibrillation ([place])   . Glaucoma   . Anxiety   . History of bladder cancer   . Lung nodule     SMALL  . OSA (obstructive sleep apnea)   . Heart murmur   . Dysrhythmia     Hx of Atrial Flutter  . Deep vein blood clot of left lower extremity ([place])   . On home oxygen therapy     At night, during the day PRN 2.5L  . Cancer ([place])     bladder cancer, skin cancer  . Diabetes mellitus without complication (HCC)     Type 2  . Depression   . Arthritis   . Osteoporosis   . Pneumonia 02/2015  . Kidney stones 2016    Past Surgical History  Procedure Laterality Date  . Cardiovascular stress test  12/25/2007    EF 64%. NO EVIDENCE OF ISCHEMIA  . Cystoscopy N/A 12/15  . Varicose vein surgery      at 82 years old first, and about 20 years ago  . Back surgery  10-18-11    [facility]  . Tonsillectomy    . Eye surgery Bilateral     cataracts  . Multiple tooth extractions    . Total knee arthroplasty Left 10/11/2014    Procedure: TOTAL KNEE ARTHROPLASTY;  Surgeon: Renette Butters,  MD;  Location: [place];  Service: Orthopedics;  Laterality: Left;  [facility] V[facility] surgery      Allergies: Amoxicillin; Motrin; and Chantix  Medications:  Current facility-administered medications:  .  0.9 %  sodium chloride infusion, , Intravenous, Continuous, Barton Dubois, MD, Last Rate: 50 mL/hr at 02/19/15 2031 .  0.9 %  sodium chloride infusion, , Intravenous, Once, Reyne Dumas, MD .  acetaminophen (TYLENOL) tablet 650 mg, 650 mg, Oral, Q6H PRN, 650 mg at 02/17/15 2004 **OR** acetaminophen (TYLENOL) suppository 650 mg, 650 mg, Rectal, Q6H PRN, Reyne Dumas, MD .  albuterol (PROVENTIL) (2.5 MG/3ML) 0.083% nebulizer solution 2.5 mg, 2.5 mg, Nebulization, Q6H PRN, Reyne Dumas, MD .  ALPRAZolam Duanne Moron) tablet 0.25 mg, 0.25 mg, Oral, QHS PRN, Reyne Dumas, MD, 0.25 mg at 02/19/15 2020 .  antiseptic oral rinse (CPC / CETYLPYRIDINIUM CHLORIDE 0.05%) solution 7 mL, 7 mL, Mouth Rinse, BID, Reyne Dumas, MD, 7 mL at 02/20/15 1000 .  atorvastatin (LIPITOR) tablet 10 mg, 10 mg, Oral, Daily, Reyne Dumas, MD, 10 mg at 02/20/15 0749 .  budesonide (PULMICORT) nebulizer solution 0.25 mg, 0.25 mg, Nebulization, BID, Barton Dubois, MD, 0.25 mg at 02/20/15 0839 .  ceFEPIme (MAXIPIME) 2 g in dextrose 5 %  50 mL IVPB, 2 g, Intravenous, Q12H, Reginia Naas, RPH, Last Rate: 100 mL/hr at 02/20/15 0430, 2 g at 02/20/15 0430 .  diltiazem (CARDIZEM) tablet 30 mg, 30 mg, Oral, 4 times per day, Reyne Dumas, MD, 30 mg at 02/20/15 3419 .  DULoxetine (CYMBALTA) DR capsule 30 mg, 30 mg, Oral, QPM, Reyne Dumas, MD, 30 mg at 02/19/15 1715 .  feeding supplement (ENSURE ENLIVE) (ENSURE ENLIVE) liquid 237 mL, 237 mL, Oral, BID BM, Reyne Dumas, MD, 237 mL at 02/19/15 1500 .  feeding supplement (GLUCERNA SHAKE) (GLUCERNA SHAKE) liquid 237 mL, 237 mL, Oral, TID BM, Satira Anis Ward, RD, 237 mL at 02/19/15 1400 .  ferrous sulfate tablet 325 mg, 325 mg, Oral, BID WC, Reyne Dumas, MD, 325 mg at 02/20/15 0748 .  fluticasone  (FLONASE) 50 MCG/ACT nasal spray 2 spray, 2 spray, Each Nare, Daily, Reyne Dumas, MD, 2 spray at 02/20/15 0750 .  gabapentin (NEURONTIN) capsule 100 mg, 100 mg, Oral, QHS, Reyne Dumas, MD, 100 mg at 02/19/15 2019 .  insulin aspart (novoLOG) injection 0-9 Units, 0-9 Units, Subcutaneous, TID WC, Reyne Dumas, MD, 1 Units at 02/20/15 0749 .  latanoprost (XALATAN) 0.005 % ophthalmic solution 1 drop, 1 drop, Both Eyes, QPM, Reyne Dumas, MD, 1 drop at 02/19/15 1716 .  levalbuterol (XOPENEX) nebulizer solution 0.63 mg, 0.63 mg, Nebulization, Q6H PRN, Reyne Dumas, MD .  levothyroxine (SYNTHROID, LEVOTHROID) tablet 50 mcg, 50 mcg, Oral, QAC breakfast, Reyne Dumas, MD, 50 mcg at 02/20/15 0749 .  mupirocin ointment (BACTROBAN) 2 % 1 application, 1 application, Nasal, BID, Reyne Dumas, MD, 1 application at 37/90/24 0751 .  ondansetron (ZOFRAN) tablet 4 mg, 4 mg, Oral, Q6H PRN **OR** ondansetron (ZOFRAN) injection 4 mg, 4 mg, Intravenous, Q6H PRN, Reyne Dumas, MD, 4 mg at 02/15/15 2137 .  oxyCODONE-acetaminophen (PERCOCET/ROXICET) 5-325 MG per tablet 1 tablet, 1 tablet, Oral, Q8H PRN, Reyne Dumas, MD, 1 tablet at 02/19/15 2020 .  pantoprazole (PROTONIX) injection 40 mg, 40 mg, Intravenous, Q12H, Reyne Dumas, MD, 40 mg at 02/20/15 0749 .  polyethylene glycol (MIRALAX / GLYCOLAX) packet 17 g, 17 g, Oral, Daily, Reyne Dumas, MD, 17 g at 02/20/15 0750 .  vancomycin (VANCOCIN) IVPB 1000 mg/200 mL premix, 1,000 mg, Intravenous, Q12H, Reginia Naas, RPH, Last Rate: 200 mL/hr at 02/20/15 0434, 1,000 mg at 02/20/15 0434    Family History  Problem Relation Age of Onset  . Cancer - Lung Mother     58  . Heart attack Mother   . Heart Problems Father   . Diabetes Brother   . Alcoholism Brother   . Other Daughter     [place]ling  . Other Other     HALF SISTER [employer] [facility]    Social History   Social History  . Marital Status: Divorced    Spouse Name: N/A  . Number of Children: 1  . Years of  Education: N/A   Occupational History  . Retired     worked at a [employer]   Social History Main Topics  . Smoking status: Former Smoker -- 1.50 packs/day for 60 years    Types: Cigarettes    Quit date: 11/27/2010  . Smokeless tobacco: Never Used  . Alcohol Use: No  . Drug Use: No  . Sexual Activity: No   Other Topics Concern  . None   Social History Narrative     Review of Systems: A 12 point ROS discussed and pertinent positives are indicated in  the HPI above.  All other systems are negative.  Review of Systems  Vital Signs: BP 120/57 mmHg  Pulse 73  Temp(Src) 98.3 F (36.8 C) (Oral)  Resp 19  Ht 5' 7"  (1.702 m)  Wt 212 lb (96.163 kg)  BMI 33.20 kg/m2  SpO2 96%  Physical Exam  Constitutional: He is oriented to person, place, and time. He appears well-developed and well-nourished. No distress.  HENT:  Head: Normocephalic.  Mouth/Throat: Oropharynx is clear and moist.  Neck: Normal range of motion. No tracheal deviation present.  Cardiovascular: Normal rate, regular rhythm and normal heart sounds.   Pulmonary/Chest: Breath sounds normal. No respiratory distress. He has no wheezes.  Abdominal: Soft. He exhibits no mass. There is no tenderness.  Neurological: He is alert and oriented to person, place, and time.  Psychiatric: He has a normal mood and affect. Judgment normal.    Mallampati Score:  MD Evaluation Airway: WNL Heart: WNL Abdomen: WNL Chest/ Lungs: WNL ASA  Classification: 3 Mallampati/Airway Score: One  Imaging: Ct Abdomen Pelvis Wo Contrast  02/16/2015  CLINICAL DATA:  Anemia, leukocytosis and tarry stools. Recent admission for sepsis. History of COPD, diabetes and hypertension. History of bladder cancer and plasma cell disorder. EXAM: CT CHEST, ABDOMEN AND PELVIS WITHOUT CONTRAST TECHNIQUE: Multidetector CT imaging of the chest, abdomen and pelvis was performed following the standard protocol without IV contrast. COMPARISON:   Chest CT 12/15/2007, abdominal pelvic CT 06/30/2014 and chest radiographs 02/10/2015. Skeletal survey 05/24/2014. FINDINGS: CT CHEST Mediastinum/Nodes: There are no enlarged mediastinal, hilar or axillary lymph nodes. Small mediastinal lymph nodes are stable. The thyroid gland, trachea and esophagus demonstrate no significant findings. The heart size is normal. There is no pericardial effusion. There is extensive atherosclerosis of the aorta, great vessels and coronary arteries. Lungs/Pleura: There is minimal dependent pleural fluid bilaterally. As demonstrated on recent radiographs, there is airspace disease with consolidation and air bronchograms in the left lower lobe. There is minimal dependent atelectasis at the right lung base. A 6 mm left upper lobe nodule on image number 16 is stable. There are no new or enlarging pulmonary nodules. Mild emphysematous changes are present. Musculoskeletal/Chest wall: There is a T9 compression fracture which appears new and likely pathologic. There are multiple irregular lucent and sclerotic lesions throughout the thoracic spine and ribs. Small purely lytic lesion noted anteriorly in the left third rib (image 21). CT ABDOMEN AND PELVIS FINDINGS Hepatobiliary: As evaluated in the noncontrast state, the liver appears stable without suspicious findings. Small calcified gallstones are again noted. There is no gallbladder wall thickening or significant biliary dilatation. Pancreas: Unremarkable. No pancreatic ductal dilatation or surrounding inflammatory changes. Spleen: Normal in size without focal abnormality. Adrenals/Urinary Tract: Chronic right adrenal mass appears unchanged, measuring 3.4 x 3.1 cm on image 51. This contains calcifications, soft tissue and fatty components. 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 The left adrenal gland appears normal. Multiple bilateral renal cysts are again noted, measuring up to 6.0 cm in the lower  pole of the right kidney and up to 5.8 cm in the interpolar region of the left kidney. Both kidneys demonstrate mild cortical thinning. There is no evidence of urinary tract calculus or hydronephrosis. The bladder appears normal. Stomach/Bowel: There appears to be mild soft tissue stranding around the distal stomach, proximal duodenum and pancreatic head. This is suboptimally evaluated due to the lack of intravenous and oral contrast. There is no extraluminal fluid collection. No evidence of bowel  wall thickening, distention or surrounding inflammatory change. Stable diverticular changes of the sigmoid colon. Vascular/Lymphatic: There are no enlarged abdominal or pelvic lymph nodes. Stable extensive atherosclerosis of the aorta, its branches and the iliac arteries. There is stable dilatation of the right common iliac artery. Reproductive: Unremarkable. Other: Stable asymmetric fat in the left inguinal canal and stable small umbilical hernia containing only fat. No evidence of ascites. Musculoskeletal: There are several new mixed lytic and sclerotic lesions within the lumbar spine. There is a dominant 2.2 cm sclerotic lesion in the S1 segment. Postsurgical changes are noted in the lower lumbar spine. There is no evidence of lumbar pathologic fracture or epidural tumor. IMPRESSION: 1. Possible inflammation involving the distal stomach and proximal duodenum in this patient with tarry stools. This is suboptimally evaluated by this examination. Consider endoscopic correlation. 2. The remainder of the small bowel and colon demonstrate no significant findings. There is distal colonic diverticulosis. 3. Left lower lobe pneumonia.  Small bilateral pleural effusions. 4. Multiple lytic and sclerotic lesions in the spine, ribs and pelvis, suspicious for metastatic disease or multiple myeloma. Correlate clinically. Probable pathologic fracture at T9. 5. Stable additional incidental findings including extensive atherosclerosis,  cholelithiasis and a chronic right adrenal mass. Electronically Signed   By: Richardean Sale M.D.   On: 02/16/2015 09:45   Dg Chest 2 View  02/10/2015  CLINICAL DATA:  Left-sided chest pain in the mornings for the last few days. EXAM: CHEST  2 VIEW COMPARISON:  04/12/2011 FINDINGS: Heart size is normal. There is atherosclerosis of the aorta. The right lung is clear. There is abnormal density in the left lower lobe posteriorly most consistent with bronchopneumonia. Follow-up to clearing recommended to ensure there is not a mass lesion. No effusion. No acute bone finding. IMPRESSION: Left lower lobe infiltrate most consistent with bronchopneumonia. Follow-up to clearing to rule out underlying mass. Electronically Signed   By: Nelson Chimes M.D.   On: 02/10/2015 15:12   Ct Head Wo Contrast  02/14/2015  CLINICAL DATA:  Syncope and headache. EXAM: CT HEAD WITHOUT CONTRAST TECHNIQUE: Contiguous axial images were obtained from the base of the skull through the vertex without intravenous contrast. 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 Stable cortical atrophy. The brain demonstrates no evidence of hemorrhage, acute infarction, edema, mass effect, extra-axial fluid collection, hydrocephalus or mass lesion. The skull is unremarkable. IMPRESSION: No acute findings. 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 Electronically Signed   By: Aletta Edouard M.D.   On: 02/14/2015 17:51   Ct Chest Wo Contrast  02/16/2015  CLINICAL DATA:  Anemia, leukocytosis and tarry stools. Recent admission for sepsis. History of COPD, diabetes and hypertension. History of bladder cancer and plasma cell disorder. EXAM: CT CHEST, ABDOMEN AND PELVIS WITHOUT CONTRAST TECHNIQUE: Multidetector CT imaging of the chest, abdomen and pelvis was performed following the standard protocol without IV contrast. COMPARISON:  Chest CT  12/15/2007, abdominal pelvic CT 06/30/2014 and chest radiographs 02/10/2015. Skeletal survey 05/24/2014. FINDINGS: CT CHEST Mediastinum/Nodes: There are no enlarged mediastinal, hilar or axillary lymph nodes. Small mediastinal lymph nodes are stable. The thyroid gland, trachea and esophagus demonstrate no significant findings. The heart size is normal. There is no pericardial effusion. There is extensive atherosclerosis of the aorta, great vessels and coronary arteries. Lungs/Pleura: There is minimal dependent pleural fluid bilaterally. As demonstrated on recent radiographs, there is airspace disease with consolidation  and air bronchograms in the left lower lobe. There is minimal dependent atelectasis at the right lung base. A 6 mm left upper lobe nodule on image number 16 is stable. There are no new or enlarging pulmonary nodules. Mild emphysematous changes are present. Musculoskeletal/Chest wall: There is a T9 compression fracture which appears new and likely pathologic. There are multiple irregular lucent and sclerotic lesions throughout the thoracic spine and ribs. Small purely lytic lesion noted anteriorly in the left third rib (image 21). CT ABDOMEN AND PELVIS FINDINGS Hepatobiliary: As evaluated in the noncontrast state, the liver appears stable without suspicious findings. Small calcified gallstones are again noted. There is no gallbladder wall thickening or significant biliary dilatation. Pancreas: Unremarkable. No pancreatic ductal dilatation or surrounding inflammatory changes. Spleen: Normal in size without focal abnormality. Adrenals/Urinary Tract: Chronic right adrenal mass appears unchanged, measuring 3.4 x 3.1 cm on image 51. This contains calcifications, soft tissue and fatty components. 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 The left adrenal gland appears normal. Multiple bilateral renal cysts are again noted, measuring up to 6.0 cm in the lower pole of  the right kidney and up to 5.8 cm in the interpolar region of the left kidney. Both kidneys demonstrate mild cortical thinning. There is no evidence of urinary tract calculus or hydronephrosis. The bladder appears normal. Stomach/Bowel: There appears to be mild soft tissue stranding around the distal stomach, proximal duodenum and pancreatic head. This is suboptimally evaluated due to the lack of intravenous and oral contrast. There is no extraluminal fluid collection. No evidence of bowel wall thickening, distention or surrounding inflammatory change. Stable diverticular changes of the sigmoid colon. Vascular/Lymphatic: There are no enlarged abdominal or pelvic lymph nodes. Stable extensive atherosclerosis of the aorta, its branches and the iliac arteries. There is stable dilatation of the right common iliac artery. Reproductive: Unremarkable. Other: Stable asymmetric fat in the left inguinal canal and stable small umbilical hernia containing only fat. No evidence of ascites. Musculoskeletal: There are several new mixed lytic and sclerotic lesions within the lumbar spine. There is a dominant 2.2 cm sclerotic lesion in the S1 segment. Postsurgical changes are noted in the lower lumbar spine. There is no evidence of lumbar pathologic fracture or epidural tumor. IMPRESSION: 1. Possible inflammation involving the distal stomach and proximal duodenum in this patient with tarry stools. This is suboptimally evaluated by this examination. Consider endoscopic correlation. 2. The remainder of the small bowel and colon demonstrate no significant findings. There is distal colonic diverticulosis. 3. Left lower lobe pneumonia.  Small bilateral pleural effusions. 4. Multiple lytic and sclerotic lesions in the spine, ribs and pelvis, suspicious for metastatic disease or multiple myeloma. Correlate clinically. Probable pathologic fracture at T9. 5. Stable additional incidental findings including extensive atherosclerosis,  cholelithiasis and a chronic right adrenal mass. Electronically Signed   By: Richardean Sale M.D.   On: 02/16/2015 09:45   Mr Abdomen Wo Contrast  02/17/2015  CLINICAL DATA:  Evaluate adrenal mass EXAM: MRI ABDOMEN WITHOUT CONTRAST TECHNIQUE: Multiplanar multisequence MR imaging was performed without the administration of intravenous contrast. COMPARISON:  02/16/2015 FINDINGS: Lower chest: Small left effusion and left lower lobe airspace consolidation is noted. Hepatobiliary: Mild hepatic steatosis. There is no focal liver abnormality. Stones are identified within the gallbladder. These measure up to 6 mm. No biliary dilatation. The common bile duct has a normal caliber. Pancreas: Pancreas is unremarkable. Spleen: Negative Adrenals/Urinary Tract: Fat signal intensity within the 2.7 cm right  adrenal mass is identified compatible with benign adrenal myelolipoma. Normal appearance of the left adrenal gland. Multiple bilateral renal cysts identified. The largest arises from the upper pole the right kidney measuring 6.3 cm. 5.5 cm cyst arises from the midpole of left kidney. No obstructive uropathy. Stomach/Bowel: The stomach is normal. The visualized upper abdominal bowel loops have a normal caliber. Vascular/Lymphatic: Aortic atherosclerosis noted. No aneurysm. No upper abdominal adenopathy identified. Other: No ascites or focal fluid collections identified within the upper abdomen. Musculoskeletal: Inferior endplate lesion at the T9 level is again identified. Lesion involving the posterior aspect of the T12 vertebra and right posterior element is again identified suspicious for metastatic disease, image number 15 of series 4. IMPRESSION: 1. 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 2. Mild hepatic steatosis. 3. Gallstones 4. Aortic atherosclerosis 5. T12 bone lesions suspicious for metastatic disease. 6. T9 compression fracture. Electronically Signed   By: Kerby Moors M.D.   On: 02/17/2015 07:39   Dg Chest Port 1 View  02/14/2015  CLINICAL DATA:  Pneumonia. EXAM: PORTABLE CHEST 1 VIEW COMPARISON:  02/10/2015 FINDINGS: Left lower lobe infiltrate again identified. This may be slightly smaller and less dense compared to the prior chest x-ray. No edema or pleural fluid. The heart size and mediastinal contours are normal. IMPRESSION: Slightly smaller and less dense appearing left lower lobe pneumonia. Electronically Signed   By: Aletta Edouard M.D.   On: 02/14/2015 15:18    Labs:  CBC:  Recent Labs  02/17/15 0528 02/18/15 0452 02/19/15 0525 02/20/15 0319  WBC 21.3* 18.8* 17.1* 16.9*  HGB 8.0* 7.1* 9.2* 8.8*  HCT 24.0* 21.2* 28.2* 27.7*  PLT 169 126* 152 152    COAGS:  Recent Labs  02/14/15 2213  INR 1.42  APTT 23*    BMP:  Recent Labs  02/16/15 0725 02/17/15 0528 02/18/15 0452 02/19/15 0525  NA 137 140 138 137  K 5.0 4.7 4.4 3.5  CL 109 110 110 105  CO2 23 22 22 23   GLUCOSE 224* 192* 176* 189*  BUN 50* 36* 19 12  CALCIUM 7.4* 7.5* 7.3* 7.5*  CREATININE 0.92 0.77 0.67 0.58*  GFRNONAA >60 >60 >60 >60  GFRAA >60 >60 >60 >60    LIVER FUNCTION TESTS:  Recent Labs  02/15/15 0645 02/17/15 0528 02/18/15 0452 02/19/15 0525  BILITOT 0.5 0.5 0.6 0.5  AST 17 19 22 24   ALT 17 17 19 20   ALKPHOS 65 61 62 71  PROT 5.0* 4.4* 4.7* 4.8*  ALBUMIN 1.8* 1.7* 1.6* 1.7*    TUMOR MARKERS: No results for input(s): AFPTM, CEA, CA199, CHROMGRNA in the last 8760 hours.  Assessment and Plan: Multiple bony lesions concerning for metastatic process. Imagine reviewed, S1 lesion is not amenable to biopsy, there is a left iliac sclerotic lesion which is new from prior imaging. D/W Dr. [name], who is okay for biopsy of this lesion. Risks and Benefits discussed with the patient including, but not limited to bleeding, infection, damage to adjacent structures or low yield requiring additional tests. All of the patient's questions were  answered, patient is agreeable to proceed. Consent signed and in chart. Labs ok   Thank you for this interesting consult.    A copy of this report was sent to the requesting provider on this date.  Electronically Signed: Ascencion Dike 02/20/2015, 10:35 AM   I spent a total of 20 Minutes  in face to face in clinical consultation, greater than 50%  of which was counseling/coordinating care for biopsy of left iliac bone lesion

## 2015-02-20 NOTE — Sedation Documentation (Signed)
Patient is resting comfortably. 

## 2015-02-21 DIAGNOSIS — J440 Chronic obstructive pulmonary disease with acute lower respiratory infection: Secondary | ICD-10-CM

## 2015-02-21 LAB — GLUCOSE, CAPILLARY
GLUCOSE-CAPILLARY: 161 mg/dL — AB (ref 65–99)
GLUCOSE-CAPILLARY: 157 mg/dL — AB (ref 65–99)
Glucose-Capillary: 154 mg/dL — ABNORMAL HIGH (ref 65–99)
Glucose-Capillary: 133 mg/dL — ABNORMAL HIGH (ref 65–99)

## 2015-02-21 LAB — CBC
HCT: 28.9 % — ABNORMAL LOW (ref 39.0–52.0)
HEMOGLOBIN: 9.3 g/dL — AB (ref 13.0–17.0)
MCH: 29.6 pg (ref 26.0–34.0)
MCHC: 32.2 g/dL (ref 30.0–36.0)
MCV: 92 fL (ref 78.0–100.0)
PLATELETS: 149 10*3/uL — AB (ref 150–400)
RBC: 3.14 MIL/uL — AB (ref 4.22–5.81)
RDW: 16.6 % — ABNORMAL HIGH (ref 11.5–15.5)
WBC: 18.1 10*3/uL — AB (ref 4.0–10.5)

## 2015-02-21 MED ORDER — DEXTROSE 5 % IV SOLN
2.0000 g | Freq: Two times a day (BID) | INTRAVENOUS | Status: DC
  Administered 2015-02-21 – 2015-02-22 (×3): 2 g via INTRAVENOUS
  Filled 2015-02-21 (×4): qty 2

## 2015-02-21 NOTE — Progress Notes (Signed)
[name] PROGRESS NOTE  Taylor [name] GQB:[id number] DOB: 1928-11-18 DOA: 02/14/2015 PCP: [name], MD  Length of stay: 7   Assessment/Plan: Principal Problem:   Sepsis ([place]) Active Problems:   Type 2 diabetes mellitus ([place])   Hypothyroidism   Hypertension   Hyperlipidemia   History of depression   COPD with acute exacerbation ([place])   Low back pain radiating to left leg   DNR (do not resuscitate)   Palliative care encounter   Weakness generalized   Fall   Acute blood loss anemia   Lytic bone lesions on xray    HPI:  82 year old male with a history of paroxysmal atrial fibrillation on xarelto, COPD, on nocturnal oxygen 2.5 L, diabetes mellitus, hypertension, recently admitted 1/6-1/8 for community-acquired pneumonia, left lower lobe, discharged home on levofloxacin for 7 days, who presents with weakness, persistent nausea, syncopal episode at home, and the patient remembers hitting the back of his head. Patient was found on the floor by his family, daughter brings him in states that patient has been weak and bed bound since his discharge. Due to nausea the patient has not been eating or drinking.   Patient has a history of chronic low back pain, followed by Dr. [name], had an MRI 2 weeks ago, was found to have lesions concerning for metastatic disease and was referred to Dr. [name] for further evaluation. Patient has been evaluated by oncology Dr. [name] during this admission. He had a face-to-face meeting with the family on 1/14.  On arrival systolic blood pressure was 64 /34 in the ER on 1/10. Sepsis protocol was initiated. White count appears to be persistently high at 17.9. Hemoglobin has dropped from 10.4>9.2. Creatinine has increased from 0.7>2.03.  GI was consulted because the patient was noted to have 2 episodes of black tarry stools. Dr. [name] will not attempt EGD due to tenuous pulmonary status. Plan is for IR to due bone biopsy.  Continue current antibiotic therapy and follow clinical response. Overall improving. Need discussion with oncology regarding waiting results of pathology while inpatient. If Hgb stabilizes and no further active bleeding; can be discharge to SNF for rehab and follow up with oncology as an outpatient.   Assessment and plan Sepsis with hypovolemic shock, hemorrhagic shock  Resolved  -Likely in the setting of recent left lower lobe pneumonia with incomplete resolution, ongoing bleeding  -White count still elevated, but trending down -lactate on admit 2.86 >1.6 now -Pro calcitonin <0.10 -Blood culture from 1/6 , 1/10 remain negative -Currently on day #8/9 of vancomycin and cefepime for sepsis coverage -per Dr. [name] who recently performed MRI of the patient's lumbar spine and there was no evidence of discitis -CT chest showed left lower lobe pneumonia  Syncope likely secondary to orthostatic hypotension in the setting of pneumonia/sepsis, GI bleeding, underlying malignancy. -negative  troponin, 2-D echo [showed EF of 60-65%], doubt PE, as patient was getting xarelto prior to admission -CT head negative  -Will monitor on tele  Community-acquired pneumonia (diagnosed 1/6) with underlying history of COPD, chronic respiratory failure -Continue antibiotics as above for 1 more day. -Previous Influenza panel negative, urine strep antigen negative.  -Previous Pro calcitonin less than 0.1, lactic acid 1.0.  -Previous Blood cultures remain negative to date. -will continue neb treatments and added pulmicort -oxygen supplementation off during the day and patient with good saturation    Hyperkalemia  -Previously K+ 6.5 , secondary to dehydration -resolved and WNL since then -  will monitor   Severe COPD with chronic respiratory failure -Using 1-2 Lpm supplemental O2 at night while home  -will continue duo nebs, wean O2 as tolerated to baseline -will continue flutter valve and pulmicort   -no wheezing today   Type II DM  - Holding home metformin while inpt  - Continue sliding scale insulin, hemoglobin A1c 7.0  Fall -given that the patient is on xarelto  CT head done to rule out intracranial bleeding. -neg images studies for acute abnormalities  -PT evaluation requested and recommending SNF; patient in agreement, but will discussed with family   Normocytic Anemia , likely now with acute blood loss anemia, malignancy  -Hemoglobin was 10.8 on 1/6, dropped to 6.7 on 1/ 11,status  posttransfusion of 5 units of packed red blood cells (last transfusion on 1/14) -Discontinued xarelto,   -will follow Hgb trend (last Hgb 1/16 was 8.8) -CT abd/pelvis showed possible inflammation of the distal stomach and proximal duodenum,unable to do EGD secondary to tenuous resp status as per GI rec's. -CT also showed lytic and sclerotic lesions in the spine and ribs and pelvis suspicious for metastatic disease or multiple myeloma  -Repeat  SPEP /UPEP,Pending, oncology following  -s/p bone biopsy on 1/16 (pathology pending) -will follow rec's -continue IV protonix BID now (had received 3 days of PPI drip)  Paroxysmal atrial fib  -discontinued  Xarelto due to bleeding -continue cardizem with holding parameters for rate control -CHADS vasc 2, patient was not on any anticoagulation prior to his left knee total arthroplasty and was placed on xarelto for DVT prophylaxis.   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 [name] has talked with IR for bone biopsy -no safe to have EGD per Dr. Benson Norway    DVT: SCD's  Code Status:      Code Status Orders        Start     Ordered   02/14/15 1720  Full code   Continuous     02/14/15 1721    Resolved   Family Communication: Discussed in  detail with the patient, all imaging results, lab results explained to the patient   Disposition Plan:  Continue inpatient status; will continue PPI IV; planning bone biopsy by IR. Will follow clinical response. Patient weak and deconditioned, will need SNF at discharge  Consultants:  GI  Oncologist  IR  Procedures:  None  Antibiotics: Anti-infectives    Start     Dose/Rate Route Frequency Ordered Stop   02/21/15 1530  ceFEPIme (MAXIPIME) 2 g in dextrose 5 % 50 mL IVPB     2 g 100 mL/hr over 30 Minutes Intravenous Every 12 hours 02/21/15 1000 02/24/15 0329   02/16/15 0900  metroNIDAZOLE (FLAGYL) IVPB 500 mg  Status:  Discontinued     500 mg 100 mL/hr over 60 Minutes Intravenous Every 8 hours 02/16/15 0831 02/19/15 1220   02/15/15 0400  vancomycin (VANCOCIN) IVPB 1000 mg/200 mL premix     1,000 mg 200 mL/hr over 60 Minutes Intravenous Every 12 hours 02/14/15 1457 02/24/15 0359   02/15/15 0330  ceFEPIme (MAXIPIME) 2 g in dextrose 5 % 50 mL IVPB  Status:  Discontinued     2 g 100 mL/hr over 30 Minutes Intravenous Every 12 hours 02/14/15 1457 02/21/15 1000   02/14/15 1530  vancomycin (VANCOCIN) 1,750  mg in sodium chloride 0.9 % 500 mL IVPB     1,750 mg 250 mL/hr over 120 Minutes Intravenous  Once 02/14/15 1449 02/14/15 1917   02/14/15 1500  ceFEPIme (MAXIPIME) 2 g in dextrose 5 % 50 mL IVPB     2 g 100 mL/hr over 30 Minutes Intravenous  Once 02/14/15 1446 02/14/15 1613   02/14/15 1500  vancomycin (VANCOCIN) IVPB 1000 mg/200 mL premix  Status:  Discontinued     1,000 mg 200 mL/hr over 60 Minutes Intravenous  Once 02/14/15 1446 02/14/15 1449      HPI/Subjective: Tachycardia improved with transfusion. Patient breathing much better and denies CP. He weak and deconditioned and in agreement with rehab at [facility]. Hgb 9.3     Objective: Filed Vitals:   02/21/15 0906 02/21/15 1159 02/21/15 1634 02/21/15 1723  BP:  118/57  113/64  Pulse:      Temp:  98.2 F (36.8 C) 98.3 F (36.8  C)   TempSrc:  Oral Oral   Resp:      Height:      Weight:      SpO2: 96%       Intake/Output Summary (Last 24 hours) at 02/21/15 1829 Last data filed at 02/21/15 1800  Gross per 24 hour  Intake   2580 ml  Output   2325 ml  Net    255 ml    Exam:  General: afebrile, feeling better and breathing back to baseline. No CP. Still with some intermittent abd pain; No acute respiratory distress. Hgb stable and denies bloody stools. Lungs: good air movement; scattered rhonchi and no exp wheezing on exam Cardiovascular: rate controlled, no rubs or gallops; no JVD Abdomen: tender to palpation on left mid abdomen; positive BS, no guarding Extremities: No significant cyanosis or clubbing on bilateral lower extremities; trace edema appreciated  Data Review   Micro Results Recent Results (from the past 240 hour(s))  Blood Culture (routine x 2)     Status: None   Collection Time: 02/14/15  3:05 PM  Result Value Ref Range Status   Specimen Description BLOOD LEFT HAND  Final   Special Requests BOTTLES DRAWN AEROBIC AND ANAEROBIC 4CC  Final   Culture NO GROWTH 5 DAYS  Final   Report Status 02/19/2015 FINAL  Final  Blood Culture (routine x 2)     Status: None   Collection Time: 02/14/15  3:30 PM  Result Value Ref Range Status   Specimen Description BLOOD LEFT WRIST  Final   Special Requests BOTTLES DRAWN AEROBIC AND ANAEROBIC 5CC  Final   Culture NO GROWTH 5 DAYS  Final   Report Status 02/19/2015 FINAL  Final  Urine culture     Status: None   Collection Time: 02/14/15  6:41 PM  Result Value Ref Range Status   Specimen Description URINE, RANDOM  Final   Special Requests NONE  Final   Culture 3,000 COLONIES/mL INSIGNIFICANT GROWTH  Final   Report Status 02/15/2015 FINAL  Final  Urine culture     Status: None   Collection Time: 02/14/15 10:12 PM  Result Value Ref Range Status   Specimen Description URINE, RANDOM  Final   Special Requests NONE  Final   Culture NO GROWTH 2 DAYS  Final    Report Status 02/16/2015 FINAL  Final  Urine culture     Status: None   Collection Time: 02/16/15  7:20 AM  Result Value Ref Range Status   Specimen Description URINE, RANDOM  Final  Special Requests NONE  Final   Culture NO GROWTH 1 DAY  Final   Report Status 02/17/2015 FINAL  Final   Radiology Reports Ct Abdomen Pelvis Wo Contrast  02/16/2015  CLINICAL DATA:  Anemia, leukocytosis and tarry stools. Recent admission for sepsis. History of COPD, diabetes and hypertension. History of bladder cancer and plasma cell disorder. EXAM: CT CHEST, ABDOMEN AND PELVIS WITHOUT CONTRAST TECHNIQUE: Multidetector CT imaging of the chest, abdomen and pelvis was performed following the standard protocol without IV contrast. COMPARISON:  Chest CT 12/15/2007, abdominal pelvic CT 06/30/2014 and chest radiographs 02/10/2015. Skeletal survey 05/24/2014. FINDINGS: CT CHEST Mediastinum/Nodes: There are no enlarged mediastinal, hilar or axillary lymph nodes. Small mediastinal lymph nodes are stable. The thyroid gland, trachea and esophagus demonstrate no significant findings. The heart size is normal. There is no pericardial effusion. There is extensive atherosclerosis of the aorta, great vessels and coronary arteries. Lungs/Pleura: There is minimal dependent pleural fluid bilaterally. As demonstrated on recent radiographs, there is airspace disease with consolidation and air bronchograms in the left lower lobe. There is minimal dependent atelectasis at the right lung base. A 6 mm left upper lobe nodule on image number 16 is stable. There are no new or enlarging pulmonary nodules. Mild emphysematous changes are present. Musculoskeletal/Chest wall: There is a T9 compression fracture which appears new and likely pathologic. There are multiple irregular lucent and sclerotic lesions throughout the thoracic spine and ribs. Small purely lytic lesion noted anteriorly in the left third rib (image 21). CT ABDOMEN AND PELVIS FINDINGS  Hepatobiliary: As evaluated in the noncontrast state, the liver appears stable without suspicious findings. Small calcified gallstones are again noted. There is no gallbladder wall thickening or significant biliary dilatation. Pancreas: Unremarkable. No pancreatic ductal dilatation or surrounding inflammatory changes. Spleen: Normal in size without focal abnormality. Adrenals/Urinary Tract: Chronic right adrenal mass appears unchanged, measuring 3.4 x 3.1 cm on image 51. This contains calcifications, soft tissue and fatty components. 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 The left adrenal gland appears normal. Multiple bilateral renal cysts are again noted, measuring up to 6.0 cm in the lower pole of the right kidney and up to 5.8 cm in the interpolar region of the left kidney. Both kidneys demonstrate mild cortical thinning. There is no evidence of urinary tract calculus or hydronephrosis. The bladder appears normal. Stomach/Bowel: There appears to be mild soft tissue stranding around the distal stomach, proximal duodenum and pancreatic head. This is suboptimally evaluated due to the lack of intravenous and oral contrast. There is no extraluminal fluid collection. No evidence of bowel wall thickening, distention or surrounding inflammatory change. Stable diverticular changes of the sigmoid colon. Vascular/Lymphatic: There are no enlarged abdominal or pelvic lymph nodes. Stable extensive atherosclerosis of the aorta, its branches and the iliac arteries. There is stable dilatation of the right common iliac artery. Reproductive: Unremarkable. Other: Stable asymmetric fat in the left inguinal canal and stable small umbilical hernia containing only fat. No evidence of ascites. Musculoskeletal: There are several new mixed lytic and sclerotic lesions within the lumbar spine. There is a dominant 2.2 cm sclerotic lesion in the S1 segment. Postsurgical changes are noted in the  lower lumbar spine. There is no evidence of lumbar pathologic fracture or epidural tumor. IMPRESSION: 1. Possible inflammation involving the distal stomach and proximal duodenum in this patient with tarry stools. This is suboptimally evaluated by this examination. Consider endoscopic correlation. 2. The remainder of the small bowel and  colon demonstrate no significant findings. There is distal colonic diverticulosis. 3. Left lower lobe pneumonia.  Small bilateral pleural effusions. 4. Multiple lytic and sclerotic lesions in the spine, ribs and pelvis, suspicious for metastatic disease or multiple myeloma. Correlate clinically. Probable pathologic fracture at T9. 5. Stable additional incidental findings including extensive atherosclerosis, cholelithiasis and a chronic right adrenal mass. Electronically Signed   By: Richardean Sale M.D.   On: 02/16/2015 09:45   Dg Chest 2 View  02/10/2015  CLINICAL DATA:  Left-sided chest pain in the mornings for the last few days. EXAM: CHEST  2 VIEW COMPARISON:  04/12/2011 FINDINGS: Heart size is normal. There is atherosclerosis of the aorta. The right lung is clear. There is abnormal density in the left lower lobe posteriorly most consistent with bronchopneumonia. Follow-up to clearing recommended to ensure there is not a mass lesion. No effusion. No acute bone finding. IMPRESSION: Left lower lobe infiltrate most consistent with bronchopneumonia. Follow-up to clearing to rule out underlying mass. Electronically Signed   By: Nelson Chimes M.D.   On: 02/10/2015 15:12   Ct Head Wo Contrast  02/14/2015  CLINICAL DATA:  Syncope and headache. EXAM: CT HEAD WITHOUT CONTRAST TECHNIQUE: Contiguous axial images were obtained from the base of the skull through the vertex without intravenous contrast. 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 Stable cortical atrophy. The brain demonstrates no evidence of  hemorrhage, acute infarction, edema, mass effect, extra-axial fluid collection, hydrocephalus or mass lesion. The skull is unremarkable. IMPRESSION: No acute findings. 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 Electronically Signed   By: Aletta Edouard M.D.   On: 02/14/2015 17:51   Ct Chest Wo Contrast  02/16/2015  CLINICAL DATA:  Anemia, leukocytosis and tarry stools. Recent admission for sepsis. History of COPD, diabetes and hypertension. History of bladder cancer and plasma cell disorder. EXAM: CT CHEST, ABDOMEN AND PELVIS WITHOUT CONTRAST TECHNIQUE: Multidetector CT imaging of the chest, abdomen and pelvis was performed following the standard protocol without IV contrast. COMPARISON:  Chest CT 12/15/2007, abdominal pelvic CT 06/30/2014 and chest radiographs 02/10/2015. Skeletal survey 05/24/2014. FINDINGS: CT CHEST Mediastinum/Nodes: There are no enlarged mediastinal, hilar or axillary lymph nodes. Small mediastinal lymph nodes are stable. The thyroid gland, trachea and esophagus demonstrate no significant findings. The heart size is normal. There is no pericardial effusion. There is extensive atherosclerosis of the aorta, great vessels and coronary arteries. Lungs/Pleura: There is minimal dependent pleural fluid bilaterally. As demonstrated on recent radiographs, there is airspace disease with consolidation and air bronchograms in the left lower lobe. There is minimal dependent atelectasis at the right lung base. A 6 mm left upper lobe nodule on image number 16 is stable. There are no new or enlarging pulmonary nodules. Mild emphysematous changes are present. Musculoskeletal/Chest wall: There is a T9 compression fracture which appears new and likely pathologic. There are multiple irregular lucent and sclerotic lesions throughout the thoracic spine and ribs. Small purely lytic lesion noted anteriorly in the left third rib (image 21). CT ABDOMEN AND PELVIS FINDINGS  Hepatobiliary: As evaluated in the noncontrast state, the liver appears stable without suspicious findings. Small calcified gallstones are again noted. There is no gallbladder wall thickening or significant biliary dilatation. Pancreas: Unremarkable. No pancreatic ductal dilatation or surrounding inflammatory changes. Spleen: Normal in size without focal abnormality. Adrenals/Urinary Tract: Chronic right adrenal mass appears unchanged, measuring 3.4 x 3.1 cm  on image 51. This contains calcifications, soft tissue and fatty components. 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 The left adrenal gland appears normal. Multiple bilateral renal cysts are again noted, measuring up to 6.0 cm in the lower pole of the right kidney and up to 5.8 cm in the interpolar region of the left kidney. Both kidneys demonstrate mild cortical thinning. There is no evidence of urinary tract calculus or hydronephrosis. The bladder appears normal. Stomach/Bowel: There appears to be mild soft tissue stranding around the distal stomach, proximal duodenum and pancreatic head. This is suboptimally evaluated due to the lack of intravenous and oral contrast. There is no extraluminal fluid collection. No evidence of bowel wall thickening, distention or surrounding inflammatory change. Stable diverticular changes of the sigmoid colon. Vascular/Lymphatic: There are no enlarged abdominal or pelvic lymph nodes. Stable extensive atherosclerosis of the aorta, its branches and the iliac arteries. There is stable dilatation of the right common iliac artery. Reproductive: Unremarkable. Other: Stable asymmetric fat in the left inguinal canal and stable small umbilical hernia containing only fat. No evidence of ascites. Musculoskeletal: There are several new mixed lytic and sclerotic lesions within the lumbar spine. There is a dominant 2.2 cm sclerotic lesion in the S1 segment. Postsurgical changes are noted in the  lower lumbar spine. There is no evidence of lumbar pathologic fracture or epidural tumor. IMPRESSION: 1. Possible inflammation involving the distal stomach and proximal duodenum in this patient with tarry stools. This is suboptimally evaluated by this examination. Consider endoscopic correlation. 2. The remainder of the small bowel and colon demonstrate no significant findings. There is distal colonic diverticulosis. 3. Left lower lobe pneumonia.  Small bilateral pleural effusions. 4. Multiple lytic and sclerotic lesions in the spine, ribs and pelvis, suspicious for metastatic disease or multiple myeloma. Correlate clinically. Probable pathologic fracture at T9. 5. Stable additional incidental findings including extensive atherosclerosis, cholelithiasis and a chronic right adrenal mass. Electronically Signed   By: Richardean Sale M.D.   On: 02/16/2015 09:45   Mr Abdomen Wo Contrast  02/17/2015  CLINICAL DATA:  Evaluate adrenal mass EXAM: MRI ABDOMEN WITHOUT CONTRAST TECHNIQUE: Multiplanar multisequence MR imaging was performed without the administration of intravenous contrast. COMPARISON:  02/16/2015 FINDINGS: Lower chest: Small left effusion and left lower lobe airspace consolidation is noted. Hepatobiliary: Mild hepatic steatosis. There is no focal liver abnormality. Stones are identified within the gallbladder. These measure up to 6 mm. No biliary dilatation. The common bile duct has a normal caliber. Pancreas: Pancreas is unremarkable. Spleen: Negative Adrenals/Urinary Tract: Fat signal intensity within the 2.7 cm right adrenal mass is identified compatible with benign adrenal myelolipoma. Normal appearance of the left adrenal gland. Multiple bilateral renal cysts identified. The largest arises from the upper pole the right kidney measuring 6.3 cm. 5.5 cm cyst arises from the midpole of left kidney. No obstructive uropathy. Stomach/Bowel: The stomach is normal. The visualized upper abdominal bowel loops have  a normal caliber. Vascular/Lymphatic: Aortic atherosclerosis noted. No aneurysm. No upper abdominal adenopathy identified. Other: No ascites or focal fluid collections identified within the upper abdomen. Musculoskeletal: Inferior endplate lesion at the T9 level is again identified. Lesion involving the posterior aspect of the T12 vertebra and right posterior element is again identified suspicious for metastatic disease, image number 15 of series 4. IMPRESSION: 1. 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 2. Mild hepatic steatosis. 3. Gallstones 4. Aortic atherosclerosis 5. T12 bone lesions suspicious  for metastatic disease. 6. T9 compression fracture. Electronically Signed   By: Kerby Moors M.D.   On: 02/17/2015 07:39   Ct Biopsy  02/20/2015  CLINICAL DATA:  Sclerotic bone lesions, anemia, leukocytosis, concern for sclerotic metastases EXAM: CT-GUIDED BIOPSY LEFT ILIAC SCLEROTIC BONE LESION MEDICATIONS AND MEDICAL HISTORY: Versed 1.0 mg, Fentanyl 50 mcg. Additional Medications: None. ANESTHESIA/SEDATION: Moderate sedation time: 11 minutes PROCEDURE: The procedure, risks, benefits, and alternatives were explained to the patient. Questions regarding the procedure were encouraged and answered. The patient understands and consents to the procedure. Previous imaging reviewed. Patient positioned prone. Noncontrast localization CT performed. The sclerotic left iliac bone lesion was localized. The left posterior hip region was prepped with chlorhexidine in a sterile fashion, and a sterile drape was applied covering the operative field. A sterile gown and sterile gloves were used for the procedure. Under CT guidance, a(n) 11 gauge gauge guide needle was advanced into the left iliac sclerotic bone lesion. Position confirmed with CT. 11 gauge core biopsy performed with the drill set. Sample placed in formalin. Final imaging was performed. Patient tolerated the procedure  well without complication. Vital sign monitoring by nursing staff during the procedure will continue as patient is in the special procedures unit for post procedure observation. FINDINGS: The images document guide needle placement within the left iliac sclerotic bone lesion. Post biopsy images demonstrate hemorrhage or hematoma. COMPLICATIONS: None immediate IMPRESSION: Successful CT-guided 11 gauge core biopsy of the left iliac sclerotic bone lesion. Electronically Signed   By: Jerilynn Mages.  Shick M.D.   On: 02/20/2015 17:07   Dg Chest Port 1 View  02/14/2015  CLINICAL DATA:  Pneumonia. EXAM: PORTABLE CHEST 1 VIEW COMPARISON:  02/10/2015 FINDINGS: Left lower lobe infiltrate again identified. This may be slightly smaller and less dense compared to the prior chest x-ray. No edema or pleural fluid. The heart size and mediastinal contours are normal. IMPRESSION: Slightly smaller and less dense appearing left lower lobe pneumonia. Electronically Signed   By: Aletta Edouard M.D.   On: 02/14/2015 15:18     CBC  Recent Labs Lab 02/14/15 2213  02/17/15 0528 02/18/15 0452 02/19/15 0525 02/20/15 0319 02/21/15 0600  WBC 17.5*  < > 21.3* 18.8* 17.1* 16.9* 18.1*  HGB 8.6*  < > 8.0* 7.1* 9.2* 8.8* 9.3*  HCT 28.6*  < > 24.0* 21.2* 28.2* 27.7* 28.9*  PLT 204  < > 169 126* 152 152 149*  MCV 90.8  < > 87.9 88.3 89.0 90.5 92.0  MCH 27.3  < > 29.3 29.6 29.0 28.8 29.6  MCHC 30.1  < > 33.3 33.5 32.6 31.8 32.2  RDW 15.0  < > 15.6* 16.0* 15.2 16.2* 16.6*  LYMPHSABS 1.1  --   --   --   --   --   --   MONOABS 1.7*  --   --   --   --   --   --   EOSABS 0.0  --   --   --   --   --   --   BASOSABS 0.0  --   --   --   --   --   --   < > = values in this interval not displayed.  Chemistries   Recent Labs Lab 02/14/15 2213 02/15/15 0645 02/16/15 0725 02/17/15 0528 02/18/15 0452 02/19/15 0525  NA 140 138 137 140 138 137  K 4.2 4.3 5.0 4.7 4.4 3.5  CL 105 107 109 110 110 105  CO2 25  22 23 22 22 23   GLUCOSE 117*  159* 224* 192* 176* 189*  BUN 43* 34* 50* 36* 19 12  CREATININE 1.31* 0.92 0.92 0.77 0.67 0.58*  CALCIUM 7.4* 7.4* 7.4* 7.5* 7.3* 7.5*  MG 1.7  --   --   --   --   --   AST 22 17  --  19 22 24   ALT 19 17  --  17 19 20   ALKPHOS 78 65  --  61 62 71  BILITOT 0.7 0.5  --  0.5 0.6 0.5   Coagulation profile  Recent Labs Lab 02/14/15 2213  INR 1.42   Cardiac Enzymes  Recent Labs Lab 02/14/15 2213 02/15/15 0645  TROPONINI <0.03  <0.03 <0.03    CBG:  Recent Labs Lab 02/20/15 1716 02/20/15 2134 02/21/15 0733 02/21/15 1112 02/21/15 1631  GLUCAP 102* 278* 161* 154* 133*   Studies: Ct Biopsy  02/20/2015  CLINICAL DATA:  Sclerotic bone lesions, anemia, leukocytosis, concern for sclerotic metastases EXAM: CT-GUIDED BIOPSY LEFT ILIAC SCLEROTIC BONE LESION MEDICATIONS AND MEDICAL HISTORY: Versed 1.0 mg, Fentanyl 50 mcg. Additional Medications: None. ANESTHESIA/SEDATION: Moderate sedation time: 11 minutes PROCEDURE: The procedure, risks, benefits, and alternatives were explained to the patient. Questions regarding the procedure were encouraged and answered. The patient understands and consents to the procedure. Previous imaging reviewed. Patient positioned prone. Noncontrast localization CT performed. The sclerotic left iliac bone lesion was localized. The left posterior hip region was prepped with chlorhexidine in a sterile fashion, and a sterile drape was applied covering the operative field. A sterile gown and sterile gloves were used for the procedure. Under CT guidance, a(n) 11 gauge gauge guide needle was advanced into the left iliac sclerotic bone lesion. Position confirmed with CT. 11 gauge core biopsy performed with the drill set. Sample placed in formalin. Final imaging was performed. Patient tolerated the procedure well without complication. Vital sign monitoring by nursing staff during the procedure will continue as patient is in the special procedures unit for post procedure  observation. FINDINGS: The images document guide needle placement within the left iliac sclerotic bone lesion. Post biopsy images demonstrate hemorrhage or hematoma. COMPLICATIONS: None immediate IMPRESSION: Successful CT-guided 11 gauge core biopsy of the left iliac sclerotic bone lesion. Electronically Signed   By: Jerilynn Mages.  Shick M.D.   On: 02/20/2015 17:07      Lab Results  Component Value Date   HGBA1C 7.1* 02/14/2015   HGBA1C 7.0* 02/10/2015   HGBA1C 6.1* 09/29/2014   Lab Results  Component Value Date   CREATININE 0.58* 02/19/2015    Scheduled Meds: . sodium chloride   Intravenous Once  . antiseptic oral rinse  7 mL Mouth Rinse BID  . atorvastatin  10 mg Oral Daily  . budesonide (PULMICORT) nebulizer solution  0.25 mg Nebulization BID  . ceFEPime (MAXIPIME) IV  2 g Intravenous Q12H  . diltiazem  30 mg Oral 4 times per day  . DULoxetine  30 mg Oral QPM  . feeding supplement (ENSURE ENLIVE)  237 mL Oral BID BM  . feeding supplement (GLUCERNA SHAKE)  237 mL Oral TID BM  . ferrous sulfate  325 mg Oral BID WC  . fluticasone  2 spray Each Nare Daily  . gabapentin  100 mg Oral QHS  . insulin aspart  0-9 Units Subcutaneous TID WC  . latanoprost  1 drop Both Eyes QPM  . levothyroxine  50 mcg Oral QAC breakfast  . pantoprazole (PROTONIX) IV  40 mg Intravenous Q12H  .  polyethylene glycol  17 g Oral Daily  . vancomycin  1,000 mg Intravenous Q12H   Continuous Infusions: . sodium chloride 50 mL/hr at 02/19/15 2031    Principal Problem:   Sepsis ([place]) Active Problems:   Type 2 diabetes mellitus ([place])   Hypothyroidism   Hypertension   Hyperlipidemia   History of depression   COPD with acute exacerbation (HCC)   Low back pain radiating to left leg   DNR (do not resuscitate)   Palliative care encounter   Weakness generalized   Fall   Acute blood loss anemia   Lytic bone lesions on xray    Time spent: 30 minutes   Barton Dubois  [facility] Pager [phone]. If  7PM-7AM, please contact night-coverage at [email or web address], password [facility] 02/21/2015, 6:29 PM  LOS: 7 days

## 2015-02-22 ENCOUNTER — Other Ambulatory Visit: Payer: Self-pay | Admitting: Oncology

## 2015-02-22 DIAGNOSIS — D62 Acute posthemorrhagic anemia: Secondary | ICD-10-CM

## 2015-02-22 DIAGNOSIS — A419 Sepsis, unspecified organism: Principal | ICD-10-CM

## 2015-02-22 LAB — BASIC METABOLIC PANEL
ANION GAP: 3 — AB (ref 5–15)
BUN: 10 mg/dL (ref 6–20)
CHLORIDE: 104 mmol/L (ref 101–111)
CO2: 29 mmol/L (ref 22–32)
Calcium: 7.8 mg/dL — ABNORMAL LOW (ref 8.9–10.3)
Creatinine, Ser: 0.69 mg/dL (ref 0.61–1.24)
GFR calc non Af Amer: >60 mL/min (ref >60)
Glucose, Bld: 180 mg/dL — ABNORMAL HIGH (ref 65–99)
Potassium: 3.4 mmol/L — ABNORMAL LOW (ref 3.5–5.1)
Sodium: 136 mmol/L (ref 135–145)

## 2015-02-22 LAB — GLUCOSE, CAPILLARY
GLUCOSE-CAPILLARY: 117 mg/dL — AB (ref 65–99)
Glucose-Capillary: 124 mg/dL — ABNORMAL HIGH (ref 65–99)
Glucose-Capillary: 172 mg/dL — ABNORMAL HIGH (ref 65–99)
Glucose-Capillary: 163 mg/dL — ABNORMAL HIGH (ref 65–99)

## 2015-02-22 MED ORDER — PANTOPRAZOLE SODIUM 40 MG PO TBEC
40.0000 mg | DELAYED_RELEASE_TABLET | Freq: Two times a day (BID) | ORAL | Status: DC
  Administered 2015-02-22 – 2015-02-24 (×4): 40 mg via ORAL
  Filled 2015-02-22 (×4): qty 1

## 2015-02-22 MED ORDER — BOOST / RESOURCE BREEZE PO LIQD
1.0000 | Freq: Three times a day (TID) | ORAL | Status: DC
  Administered 2015-02-23 – 2015-02-24 (×3): 1 via ORAL

## 2015-02-22 MED ORDER — DULOXETINE HCL 20 MG PO CPEP
20.0000 mg | ORAL_CAPSULE | Freq: Every evening | ORAL | Status: DC
  Administered 2015-02-23: 20 mg via ORAL
  Filled 2015-02-22 (×2): qty 1

## 2015-02-22 MED ORDER — SENNOSIDES-DOCUSATE SODIUM 8.6-50 MG PO TABS
2.0000 | ORAL_TABLET | Freq: Two times a day (BID) | ORAL | Status: DC
  Administered 2015-02-22 – 2015-02-24 (×4): 2 via ORAL
  Filled 2015-02-22 (×4): qty 2

## 2015-02-22 MED ORDER — DILTIAZEM HCL ER COATED BEADS 120 MG PO CP24
120.0000 mg | ORAL_CAPSULE | Freq: Every day | ORAL | Status: DC
  Administered 2015-02-23 – 2015-02-24 (×2): 120 mg via ORAL
  Filled 2015-02-22 (×2): qty 1

## 2015-02-22 MED ORDER — MAGNESIUM HYDROXIDE 400 MG/5ML PO SUSP
30.0000 mL | Freq: Every day | ORAL | Status: DC | PRN
  Filled 2015-02-22: qty 30

## 2015-02-22 NOTE — Clinical Social Work Placement (Signed)
   CLINICAL SOCIAL WORK PLACEMENT  NOTE  Date:  02/22/2015  Patient Details  Name: Taylor [name] MRN: [id number] Date of Birth: 02-05-1928  Clinical Social Work is seeking post-discharge placement for this patient at the [place] level of care (*CSW will initial, date and re-position this form in  chart as items are completed):  Yes   Patient/family provided with [facility] Work Department's list of facilities offering this level of care within the geographic area requested by the patient (or if unable, by the patient's family).  Yes   Patient/family informed of their freedom to choose among providers that offer the needed level of care, that participate in Medicare, Medicaid or managed care program needed by the patient, have an available bed and are willing to accept the patient.  Yes   Patient/family informed of [facility]'s ownership interest in [facility] and [facility], as well as of the fact that they are under no obligation to receive care at these facilities.  PASRR submitted to EDS on       PASRR number received on       Existing PASRR number confirmed on 02/22/15     FL2 transmitted to all facilities in geographic area requested by pt/family on 02/22/15     FL2 transmitted to all facilities within larger geographic area on       Patient informed that his/her managed care company has contracts with or will negotiate with certain facilities, including the following:            Patient/family informed of bed offers received.  Patient chooses bed at       Physician recommends and patient chooses bed at      Patient to be transferred to   on  .  Patient to be transferred to facility by       Patient family notified on   of transfer.  Name of family member notified:        PHYSICIAN Please sign FL2, Please sign DNR, Please prepare priority discharge summary, including medications     Additional Comment:     Barbette Or, [place]

## 2015-02-22 NOTE — Progress Notes (Signed)
Physical Therapy Treatment Patient Details Name: Taylor [name] MRN: [id number] DOB: 12-28-1928 Today's Date: 02/22/2015    History of Present Illness 82 year old male with Taylor history of paroxysmal atrial fibrillation, COPD, on nocturnal oxygen 2.5 L, diabetes mellitus, hypertension, recently admitted 1/6-1/8 for community-acquired pneumonia, left lower lobe, discharged home, who presents with weakness, persistent nausea, syncopal episode at home, and Taylor fall. Pt with suspected bone mets, for biopsy 02/20/15, palliative consult.    PT Comments    Patient progressing slowly towards PT goals. Improved ambulation distance with cues for safety and assist. Very slow and increased time for all mobility. Much difficulty standing requiring assist of 2 especially from low surfaces. Appropriate for SNF and very motivated. Will continue to follow.   Follow Up Recommendations  SNF     Equipment Recommendations  None recommended by PT    Recommendations for Other Services       Precautions / Restrictions Precautions Precautions: Fall Precaution Comments: back for comfort Restrictions Weight Bearing Restrictions: No    Mobility  Bed Mobility Overal bed mobility: Needs Assistance Bed Mobility: Rolling;Sidelying to Sit;Sit to Sidelying Rolling: Min assist Sidelying to sit: Mod assist     Sit to sidelying: Mod assist General bed mobility comments: assist for trunk and LEs, pt able to scoot to EOB with extra time. Use of rails. Cues for log roll technique.  Transfers Overall transfer level: Needs assistance Equipment used: Rolling walker (2 wheeled) Transfers: Sit to/from Stand Sit to Stand: Mod assist;+2 physical assistance;Max assist Stand pivot transfers: Min guard       General transfer comment: Mod Taylor of 2 to boost from EOB with cues for hand placement/technique. Max Taylor to stand from low surface. Use of momentum.  Ambulation/Gait Ambulation/Gait assistance: Min guard Ambulation  Distance (Feet): 100 Feet Assistive device: Rolling walker (2 wheeled) Gait Pattern/deviations: Step-through pattern;Decreased stride length;Trunk flexed Gait velocity: slow Gait velocity interpretation: <1.8 ft/sec, indicative of risk for recurrent falls General Gait Details: Slow, unsteady gait. Fatigues. 1 standing rest break. HR up to 126 bpm.   Stairs            Wheelchair Mobility    Modified Rankin (Stroke Patients Only)       Balance Overall balance assessment: Needs assistance Sitting-balance support: Feet supported;No upper extremity supported Sitting balance-Leahy Scale: Good     Standing balance support: During functional activity Standing balance-Leahy Scale: Poor Standing balance comment: Reliant on RW for support. Side stepped along side bed with Min guard assist.                     Cognition Arousal/Alertness: Awake/alert Behavior During Therapy: WFL for tasks assessed/performed Overall Cognitive Status: Within Functional Limits for tasks assessed                      Exercises General Exercises - Lower Extremity Ankle Circles/Pumps: Both;15 reps;Seated    General Comments        Pertinent Vitals/Pain Pain Assessment: No/denies pain    Home Living                      Prior Function            PT Goals (current goals can now be found in the care plan section) Acute Rehab PT Goals Patient Stated Goal: I'm going to do whatever you tell me Progress towards PT goals: Progressing toward goals    Frequency  Min 2X/week  PT Plan Current plan remains appropriate    Co-evaluation             End of Session Equipment Utilized During Treatment: Gait belt Activity Tolerance: Patient tolerated treatment well Patient left: in bed;with call bell/phone within reach;with bed alarm set     Time: 1452-1530 PT Time Calculation (min) (ACUTE ONLY): 38 min  Charges:  $Gait Training: 23-37 mins $Therapeutic  Activity: 8-22 mins                    G Codes:      Taylor [name] Taylor [name] 02/22/2015, 3:50 PM Taylor [name], [place], DPT [phone]

## 2015-02-22 NOTE — Progress Notes (Signed)
Occupational Therapy Treatment Patient Details Name: Taylor [name] MRN: [id number] DOB: [birth year] Today's Date: 02/22/2015    History of present illness 82 year old male with a history of paroxysmal atrial fibrillation, COPD, on nocturnal oxygen 2.5 L, diabetes mellitus, hypertension, recently admitted 1/6-1/8 for community-acquired pneumonia, left lower lobe, discharged home, who presents with weakness, persistent nausea, syncopal episode at home, and a fall. Pt with suspected bone mets, for biopsy 02/20/15, palliative consult.   OT comments  Pt continues to demonstrate weakness, decreased activity tolerance and impaired balance impeding ability to perform self care, rise from all surfaces and ambulate.  Continue to recommend ST rehab in SNF.   Follow Up Recommendations  SNF    Equipment Recommendations       Recommendations for Other Services      Precautions / Restrictions Precautions Precautions: Fall Precaution Comments: back for comfort       Mobility Bed Mobility Overal bed mobility: Needs Assistance Bed Mobility: Rolling;Sidelying to Sit Rolling: Min assist Sidelying to sit: Min assist       General bed mobility comments: assist for trunk and LEs, pt able to scoot to EOB with extra time  Transfers Overall transfer level: Needs assistance Equipment used: Rolling walker (2 wheeled) Transfers: Sit to/from [facility] Sit to Stand: Min assist Stand pivot transfers: Min guard       General transfer comment: min assist using momentum to stand from bed, placed pillows in recliner to ease standing     Balance                                   ADL Overall ADL's : Needs assistance/impaired Eating/Feeding: Minimal assistance;Sitting Eating/Feeding Details (indicate cue type and reason): assist to cut up food, open containers Grooming: Wash/dry hands;Wash/dry face;Set up;Sitting           Upper Body Dressing : Minimal  assistance;Sitting   Lower Body Dressing: Maximal assistance;Sit to/from stand                        Vision                     Perception     Praxis      Cognition   Behavior During Therapy: [facility] for tasks assessed/performed Overall Cognitive Status: Within Functional Limits for tasks assessed                       Extremity/Trunk Assessment               Exercises     Shoulder Instructions       General Comments      Pertinent Vitals/ Pain       Pain Assessment: No/denies pain  Home Living                                          Prior Functioning/Environment              Frequency Min 2X/week     Progress Toward Goals  OT Goals(current goals can now be found in the care plan section)  Progress towards OT goals: Progressing toward goals  Acute Rehab OT Goals Patient Stated Goal: I'm going to do whatever you tell me Time For Goal  Achievement: 03/06/15 Potential to Achieve Goals: [place] Discharge plan remains appropriate    Co-evaluation                 End of Session Equipment Utilized During Treatment: Gait belt;Rolling walker   Activity Tolerance Patient limited by fatigue   Patient Left in chair;with call bell/phone within reach;with chair alarm set   Nurse Communication Other (comment) (ok to leave off 02)        Time: 1205-1232 OT Time Calculation (min): 27 min  Charges: OT General Charges $OT Visit: 1 Procedure OT Treatments $Self Care/Home Management : 23-37 mins  Malka So 02/22/2015, 1:52 PM  [phone]

## 2015-02-22 NOTE — Progress Notes (Signed)
Nutrition Follow-up  DOCUMENTATION CODES:   Obesity unspecified  INTERVENTION:   -D/c Ensure Enlive po BID, each supplement provides 350 kcal and 20 grams of protein, due to poor acceptance -D/c Glucerna Shake po TID, each supplement provides 220 kcal and 10 grams of protein, due to poor acceptance -Boost Breeze po TID, each supplement provides 250 kcal and 9 grams of protein  NUTRITION DIAGNOSIS:   Inadequate oral intake related to inability to eat, poor appetite as evidenced by per patient/family report.  Ongoing  GOAL:   Patient will meet greater than or equal to 90% of their needs  [place]  MONITOR:   PO intake, I & O's, Labs, Weight trends, Supplement acceptance  REASON FOR ASSESSMENT:   Malnutrition Screening Tool    ASSESSMENT:   82 year old male with a history of paroxysmal atrial fibrillation on xarelto, COPD, on nocturnal oxygen 2.5 L, diabetes mellitus, hypertension, recently admitted 1/6-1/8 for community-acquired pneumonia, left lower lobe, discharged home on levofloxacin for 7 days, who presents with weakness, persistent nausea, syncopal episode at home, and the patient remembers hitting the back of his head. Patient was found on the floor by his family, daughter brings him in states that patient has been weak and bed bound since his discharge. Due to nausea the patient has not been eating or drinking. No diarrhea, continues to have pleuritic chest pain when he coughs. Denies any headache, blurry vision, slurred speech. Patient has a history of chronic low back pain, followed by Dr. [name], had an MRI 2 weeks ago, was found to have lesions concerning for metastatic disease and was referred to Dr. [name] for further evaluation. He has not been seen by oncology and had an upcoming appointment next week. Patient states that he has been compliant with all his medications including HCTZ, levofloxacin.  Pt admitted with sepsis with hypovolemic shock, which has  resolved per MD notes.   Oncology following; pt underwent left iliac bone lesion core biopsy on 02/20/15. Awaiting results.   Pt sleeping soundly in bed at time of visit. Pt appetite is fair; noted meal completion 25-75% (improved over the past 24-48 hours). Pt has been refusing both Ensure and Glucerna supplements, as they make him sick to his stomach.  CSW following. Therapy recommending SNF placement.  Labs reviewed: K: 3.4, CBGS: 124-172.   Diet Order:  Diet heart healthy/carb modified Room service appropriate?: Yes; Fluid consistency:: Thin  Skin:  Wound (see comment) (Skin tear to L Elbow)  Last BM:  02/20/15  Height:   Ht Readings from Last 1 Encounters:  02/14/15 '5\' 7"'$  (1.702 m)    Weight:   Wt Readings from Last 1 Encounters:  02/22/15 205 lb 6.4 oz (93.169 kg)    Ideal Body Weight:  67.3 kg  BMI:  Body mass index is 32.16 kg/(m^2).  Estimated Nutritional Needs:   Kcal:  1800-2000  Protein:  90-100 grams  Fluid:  >./= 1.8L  EDUCATION NEEDS:   No education needs identified at this time  Jesenya Bowditch A. Jimmye Norman, RD, LDN, CDE Pager: [phone] After hours Pager: [phone]

## 2015-02-22 NOTE — Progress Notes (Signed)
[name] PROGRESS NOTE  Taylor [name]:[id number] DOB: 12-Aug-1928 DOA: 02/14/2015 PCP: [name], MD  Length of stay: 8   Assessment/Plan: Principal Problem:   Sepsis ([place]) Active Problems:   Type 2 diabetes mellitus ([place])   Hypothyroidism   Hypertension   Hyperlipidemia   History of depression   COPD with acute exacerbation ([place])   Low back pain radiating to left leg   DNR (do not resuscitate)   Palliative care encounter   Weakness generalized   Fall   Acute blood loss anemia   Lytic bone lesions on xray    HPI:  82 year old male with a history of paroxysmal atrial fibrillation on xarelto, COPD, on nocturnal oxygen 2.5 L, diabetes mellitus, hypertension, recently admitted 1/6-1/8 for community-acquired pneumonia, left lower lobe, discharged home on levofloxacin for 7 days, who presents with weakness, persistent nausea, syncopal episode at home, and the patient remembers hitting the back of his head. Patient was found on the floor by his family, daughter brings him in states that patient has been weak and bed bound since his discharge. Due to nausea the patient has not been eating or drinking.   Patient has a history of chronic low back pain, followed by Dr. [name], had an MRI 2 weeks ago, was found to have lesions concerning for metastatic disease and was referred to Dr. [name] for further evaluation. Patient has been evaluated by oncology Dr. [name] during this admission. He had a face-to-face meeting with the family on 1/14.  On arrival systolic blood pressure was 64 /34 in the ER on 1/10. Sepsis protocol was initiated. White count appears to be persistently high at 17.9. Hemoglobin has dropped from 10.4>9.2. Creatinine has increased from 0.7>2.03.  GI was consulted because the patient was noted to have 2 episodes of black tarry stools. Dr. [name] will not attempt EGD due to tenuous pulmonary status. Plan is for IR to due bone biopsy.  Continue current antibiotic therapy and follow clinical response. Overall improving. Need discussion with oncology regarding waiting results of pathology while inpatient. If Hgb stabilizes and no further active bleeding; can be discharge to SNF for rehab and follow up with oncology as an outpatient.   Assessment and plan Sepsis with hypovolemic shock, hemorrhagic shock  Resolved  -Likely in the setting of recent left lower lobe pneumonia with incomplete resolution, ongoing bleeding  -lactate on admit 2.86 >1.6 now -Pro calcitonin <0.10 -Blood culture from 1/6 , 1/10 remain negative -Completed 9 days of IV antibiotics for sepsis coverage -per Dr. [name] who recently performed MRI of the patient's lumbar spine and there was no evidence of discitis -CT chest showed left lower lobe pneumonia  Syncope likely secondary to orthostatic hypotension in the setting of pneumonia/sepsis, GI bleeding, underlying malignancy. -negative  troponin, 2-D echo [showed EF of 60-65%], doubt PE, as patient was getting xarelto prior to admission -CT head negative  -Will monitor on tele  Community-acquired pneumonia (diagnosed 1/6) with underlying history of COPD, chronic respiratory failure -Complete the course of antibiotics.  -Previous Influenza panel negative, urine strep antigen negative.  -Previous Pro calcitonin less than 0.1, lactic acid 1.0.  -Previous Blood cultures remain negative to date. -will continue neb treatments and added pulmicort -oxygen supplementation off during the day and patient with good saturation    Hyperkalemia  -Previously K+ 6.5 , secondary to dehydration -resolved and WNL since then -will monitor   Severe COPD with chronic respiratory failure -Using  1-2 Lpm supplemental O2 at night while home  -will continue duo nebs, wean O2 as tolerated to baseline -will continue flutter valve and pulmicort  -no wheezing today   Type II DM  - Holding home metformin while  inpt  - Continue sliding scale insulin, hemoglobin A1c 7.0 - CBG (last 3)   Recent Labs  02/22/15 0734 02/22/15 1200 02/22/15 1637  GLUCAP 124* 172* 117*      Fall -given that the patient is on xarelto  CT head done to rule out intracranial bleeding. -neg images studies for acute abnormalities  -PT evaluation requested and recommending SNF; patient in agreement,  discussed with family, plan for discharge in am.   Normocytic Anemia , likely now with acute blood loss anemia, malignancy  -Hemoglobin was 10.8 on 1/6, dropped to 6.7 on 1/ 11,status  posttransfusion of 5 units of packed red blood cells (last transfusion on 1/14) -Discontinued xarelto,   -last hemoglobin at 9.3 -CT abd/pelvis showed possible inflammation of the distal stomach and proximal duodenum,unable to do EGD secondary to tenuous resp status as per GI rec's. -CT also showed lytic and sclerotic lesions in the spine and ribs and pelvis suspicious for metastatic disease or multiple myeloma  -Repeat  SPEP /UPEP,Pending, oncology following , recommended outpatient follow up.  -s/p bone biopsy on 1/16 (pathology pending), outpatient follow up of the results of the bone biopsy.  - resume PPI ON discharge, changed to oral tonight.   Paroxysmal atrial fib  -discontinued  Xarelto due to bleeding -continue cardizem with holding parameters for rate control -CHADS vasc 2, patient was not on any anticoagulation prior to his left knee total arthroplasty and was placed on xarelto for DVT prophylaxis.   Rate controlled.   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 [name] has talked with IR for bone biopsy, underwent bone biopsy on Monday 1/16.  -not safe to have EGD per Dr. Benson Norway.     DVT: SCD's  Code Status:      Code Status Orders         Start     Ordered   02/14/15 1720  Full code   Continuous     02/14/15 1721    Resolved   Family Communication: discussed with daughter over the phone. On 1/18. About disposition too.   Disposition Plan:  SNF ON discharge tomorrow.  Consultants:  GI  Oncologist  IR  Procedures:  None  Antibiotics: Anti-infectives    Start     Dose/Rate Route Frequency Ordered Stop   02/21/15 1530  ceFEPIme (MAXIPIME) 2 g in dextrose 5 % 50 mL IVPB     2 g 100 mL/hr over 30 Minutes Intravenous Every 12 hours 02/21/15 1000 02/24/15 0329   02/16/15 0900  metroNIDAZOLE (FLAGYL) IVPB 500 mg  Status:  Discontinued     500 mg 100 mL/hr over 60 Minutes Intravenous Every 8 hours 02/16/15 0831 02/19/15 1220   02/15/15 0400  vancomycin (VANCOCIN) IVPB 1000 mg/200 mL premix     1,000 mg 200 mL/hr over 60 Minutes Intravenous Every 12 hours 02/14/15 1457 02/24/15 0359   02/15/15 0330  ceFEPIme (MAXIPIME) 2 g in dextrose 5 % 50 mL IVPB  Status:  Discontinued     2 g 100 mL/hr over 30 Minutes Intravenous Every 12 hours 02/14/15 1457 02/21/15 1000   02/14/15  1530  vancomycin (VANCOCIN) 1,750 mg in sodium chloride 0.9 % 500 mL IVPB     1,750 mg 250 mL/hr over 120 Minutes Intravenous  Once 02/14/15 1449 02/14/15 1917   02/14/15 1500  ceFEPIme (MAXIPIME) 2 g in dextrose 5 % 50 mL IVPB     2 g 100 mL/hr over 30 Minutes Intravenous  Once 02/14/15 1446 02/14/15 1613   02/14/15 1500  vancomycin (VANCOCIN) IVPB 1000 mg/200 mL premix  Status:  Discontinued     1,000 mg 200 mL/hr over 60 Minutes Intravenous  Once 02/14/15 1446 02/14/15 1449      HPI/Subjective: Tachycardia improved with transfusion. Patient breathing much better and denies CP. He weak and deconditioned and in agreement with rehab at [facility]. Hgb 9.3     Objective: Filed Vitals:   02/22/15 0702 02/22/15 1103 02/22/15 1215 02/22/15 1658  BP: 118/56 116/53 116/53 116/53  Pulse:  78    Temp:      TempSrc:      Resp:  17    Height:       Weight:      SpO2:  94%      Intake/Output Summary (Last 24 hours) at 02/22/15 1841 Last data filed at 02/22/15 1605  Gross per 24 hour  Intake   1980 ml  Output   2825 ml  Net   -845 ml    Exam:  General: afebrile, feeling better and breathing back to baseline. No CP. Still with some intermittent abd pain; No acute respiratory distress. Hgb stable and denies bloody stools. Lungs: good air movement; scattered rhonchi and no exp wheezing on exam Cardiovascular: rate controlled, no rubs or gallops; no JVD Abdomen: tender to palpation on left mid abdomen; positive BS, no guarding Extremities: No significant cyanosis or clubbing on bilateral lower extremities; trace edema appreciated  Data Review   Micro Results Recent Results (from the past 240 hour(s))  Blood Culture (routine x 2)     Status: None   Collection Time: 02/14/15  3:05 PM  Result Value Ref Range Status   Specimen Description BLOOD LEFT HAND  Final   Special Requests BOTTLES DRAWN AEROBIC AND ANAEROBIC 4CC  Final   Culture NO GROWTH 5 DAYS  Final   Report Status 02/19/2015 FINAL  Final  Blood Culture (routine x 2)     Status: None   Collection Time: 02/14/15  3:30 PM  Result Value Ref Range Status   Specimen Description BLOOD LEFT WRIST  Final   Special Requests BOTTLES DRAWN AEROBIC AND ANAEROBIC 5CC  Final   Culture NO GROWTH 5 DAYS  Final   Report Status 02/19/2015 FINAL  Final  Urine culture     Status: None   Collection Time: 02/14/15  6:41 PM  Result Value Ref Range Status   Specimen Description URINE, RANDOM  Final   Special Requests NONE  Final   Culture 3,000 COLONIES/mL INSIGNIFICANT GROWTH  Final   Report Status 02/15/2015 FINAL  Final  Urine culture     Status: None   Collection Time: 02/14/15 10:12 PM  Result Value Ref Range Status   Specimen Description URINE, RANDOM  Final   Special Requests NONE  Final   Culture NO GROWTH 2 DAYS  Final   Report Status 02/16/2015 FINAL  Final  Urine  culture     Status: None   Collection Time: 02/16/15  7:20 AM  Result Value Ref Range Status   Specimen Description URINE, RANDOM  Final   Special  Requests NONE  Final   Culture NO GROWTH 1 DAY  Final   Report Status 02/17/2015 FINAL  Final   Radiology Reports Ct Abdomen Pelvis Wo Contrast  02/16/2015  CLINICAL DATA:  Anemia, leukocytosis and tarry stools. Recent admission for sepsis. History of COPD, diabetes and hypertension. History of bladder cancer and plasma cell disorder. EXAM: CT CHEST, ABDOMEN AND PELVIS WITHOUT CONTRAST TECHNIQUE: Multidetector CT imaging of the chest, abdomen and pelvis was performed following the standard protocol without IV contrast. COMPARISON:  Chest CT 12/15/2007, abdominal pelvic CT 06/30/2014 and chest radiographs 02/10/2015. Skeletal survey 05/24/2014. FINDINGS: CT CHEST Mediastinum/Nodes: There are no enlarged mediastinal, hilar or axillary lymph nodes. Small mediastinal lymph nodes are stable. The thyroid gland, trachea and esophagus demonstrate no significant findings. The heart size is normal. There is no pericardial effusion. There is extensive atherosclerosis of the aorta, great vessels and coronary arteries. Lungs/Pleura: There is minimal dependent pleural fluid bilaterally. As demonstrated on recent radiographs, there is airspace disease with consolidation and air bronchograms in the left lower lobe. There is minimal dependent atelectasis at the right lung base. A 6 mm left upper lobe nodule on image number 16 is stable. There are no new or enlarging pulmonary nodules. Mild emphysematous changes are present. Musculoskeletal/Chest wall: There is a T9 compression fracture which appears new and likely pathologic. There are multiple irregular lucent and sclerotic lesions throughout the thoracic spine and ribs. Small purely lytic lesion noted anteriorly in the left third rib (image 21). CT ABDOMEN AND PELVIS FINDINGS Hepatobiliary: As evaluated in the noncontrast  state, the liver appears stable without suspicious findings. Small calcified gallstones are again noted. There is no gallbladder wall thickening or significant biliary dilatation. Pancreas: Unremarkable. No pancreatic ductal dilatation or surrounding inflammatory changes. Spleen: Normal in size without focal abnormality. Adrenals/Urinary Tract: Chronic right adrenal mass appears unchanged, measuring 3.4 x 3.1 cm on image 51. This contains calcifications, soft tissue and fatty components. 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 The left adrenal gland appears normal. Multiple bilateral renal cysts are again noted, measuring up to 6.0 cm in the lower pole of the right kidney and up to 5.8 cm in the interpolar region of the left kidney. Both kidneys demonstrate mild cortical thinning. There is no evidence of urinary tract calculus or hydronephrosis. The bladder appears normal. Stomach/Bowel: There appears to be mild soft tissue stranding around the distal stomach, proximal duodenum and pancreatic head. This is suboptimally evaluated due to the lack of intravenous and oral contrast. There is no extraluminal fluid collection. No evidence of bowel wall thickening, distention or surrounding inflammatory change. Stable diverticular changes of the sigmoid colon. Vascular/Lymphatic: There are no enlarged abdominal or pelvic lymph nodes. Stable extensive atherosclerosis of the aorta, its branches and the iliac arteries. There is stable dilatation of the right common iliac artery. Reproductive: Unremarkable. Other: Stable asymmetric fat in the left inguinal canal and stable small umbilical hernia containing only fat. No evidence of ascites. Musculoskeletal: There are several new mixed lytic and sclerotic lesions within the lumbar spine. There is a dominant 2.2 cm sclerotic lesion in the S1 segment. Postsurgical changes are noted in the lower lumbar spine. There is no evidence of lumbar  pathologic fracture or epidural tumor. IMPRESSION: 1. Possible inflammation involving the distal stomach and proximal duodenum in this patient with tarry stools. This is suboptimally evaluated by this examination. Consider endoscopic correlation. 2. The remainder of the small bowel and colon  demonstrate no significant findings. There is distal colonic diverticulosis. 3. Left lower lobe pneumonia.  Small bilateral pleural effusions. 4. Multiple lytic and sclerotic lesions in the spine, ribs and pelvis, suspicious for metastatic disease or multiple myeloma. Correlate clinically. Probable pathologic fracture at T9. 5. Stable additional incidental findings including extensive atherosclerosis, cholelithiasis and a chronic right adrenal mass. Electronically Signed   By: Richardean Sale M.D.   On: 02/16/2015 09:45   Dg Chest 2 View  02/10/2015  CLINICAL DATA:  Left-sided chest pain in the mornings for the last few days. EXAM: CHEST  2 VIEW COMPARISON:  04/12/2011 FINDINGS: Heart size is normal. There is atherosclerosis of the aorta. The right lung is clear. There is abnormal density in the left lower lobe posteriorly most consistent with bronchopneumonia. Follow-up to clearing recommended to ensure there is not a mass lesion. No effusion. No acute bone finding. IMPRESSION: Left lower lobe infiltrate most consistent with bronchopneumonia. Follow-up to clearing to rule out underlying mass. Electronically Signed   By: Nelson Chimes M.D.   On: 02/10/2015 15:12   Ct Head Wo Contrast  02/14/2015  CLINICAL DATA:  Syncope and headache. EXAM: CT HEAD WITHOUT CONTRAST TECHNIQUE: Contiguous axial images were obtained from the base of the skull through the vertex without intravenous contrast. 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 Stable cortical atrophy. The brain demonstrates no evidence of hemorrhage, acute infarction, edema, mass effect,  extra-axial fluid collection, hydrocephalus or mass lesion. The skull is unremarkable. IMPRESSION: No acute findings. 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 Electronically Signed   By: Aletta Edouard M.D.   On: 02/14/2015 17:51   Ct Chest Wo Contrast  02/16/2015  CLINICAL DATA:  Anemia, leukocytosis and tarry stools. Recent admission for sepsis. History of COPD, diabetes and hypertension. History of bladder cancer and plasma cell disorder. EXAM: CT CHEST, ABDOMEN AND PELVIS WITHOUT CONTRAST TECHNIQUE: Multidetector CT imaging of the chest, abdomen and pelvis was performed following the standard protocol without IV contrast. COMPARISON:  Chest CT 12/15/2007, abdominal pelvic CT 06/30/2014 and chest radiographs 02/10/2015. Skeletal survey 05/24/2014. FINDINGS: CT CHEST Mediastinum/Nodes: There are no enlarged mediastinal, hilar or axillary lymph nodes. Small mediastinal lymph nodes are stable. The thyroid gland, trachea and esophagus demonstrate no significant findings. The heart size is normal. There is no pericardial effusion. There is extensive atherosclerosis of the aorta, great vessels and coronary arteries. Lungs/Pleura: There is minimal dependent pleural fluid bilaterally. As demonstrated on recent radiographs, there is airspace disease with consolidation and air bronchograms in the left lower lobe. There is minimal dependent atelectasis at the right lung base. A 6 mm left upper lobe nodule on image number 16 is stable. There are no new or enlarging pulmonary nodules. Mild emphysematous changes are present. Musculoskeletal/Chest wall: There is a T9 compression fracture which appears new and likely pathologic. There are multiple irregular lucent and sclerotic lesions throughout the thoracic spine and ribs. Small purely lytic lesion noted anteriorly in the left third rib (image 21). CT ABDOMEN AND PELVIS FINDINGS Hepatobiliary: As evaluated in the noncontrast state, the  liver appears stable without suspicious findings. Small calcified gallstones are again noted. There is no gallbladder wall thickening or significant biliary dilatation. Pancreas: Unremarkable. No pancreatic ductal dilatation or surrounding inflammatory changes. Spleen: Normal in size without focal abnormality. Adrenals/Urinary Tract: Chronic right adrenal mass appears unchanged, measuring 3.4 x 3.1 cm on  image 51. This contains calcifications, soft tissue and fatty components. 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 The left adrenal gland appears normal. Multiple bilateral renal cysts are again noted, measuring up to 6.0 cm in the lower pole of the right kidney and up to 5.8 cm in the interpolar region of the left kidney. Both kidneys demonstrate mild cortical thinning. There is no evidence of urinary tract calculus or hydronephrosis. The bladder appears normal. Stomach/Bowel: There appears to be mild soft tissue stranding around the distal stomach, proximal duodenum and pancreatic head. This is suboptimally evaluated due to the lack of intravenous and oral contrast. There is no extraluminal fluid collection. No evidence of bowel wall thickening, distention or surrounding inflammatory change. Stable diverticular changes of the sigmoid colon. Vascular/Lymphatic: There are no enlarged abdominal or pelvic lymph nodes. Stable extensive atherosclerosis of the aorta, its branches and the iliac arteries. There is stable dilatation of the right common iliac artery. Reproductive: Unremarkable. Other: Stable asymmetric fat in the left inguinal canal and stable small umbilical hernia containing only fat. No evidence of ascites. Musculoskeletal: There are several new mixed lytic and sclerotic lesions within the lumbar spine. There is a dominant 2.2 cm sclerotic lesion in the S1 segment. Postsurgical changes are noted in the lower lumbar spine. There is no evidence of lumbar pathologic  fracture or epidural tumor. IMPRESSION: 1. Possible inflammation involving the distal stomach and proximal duodenum in this patient with tarry stools. This is suboptimally evaluated by this examination. Consider endoscopic correlation. 2. The remainder of the small bowel and colon demonstrate no significant findings. There is distal colonic diverticulosis. 3. Left lower lobe pneumonia.  Small bilateral pleural effusions. 4. Multiple lytic and sclerotic lesions in the spine, ribs and pelvis, suspicious for metastatic disease or multiple myeloma. Correlate clinically. Probable pathologic fracture at T9. 5. Stable additional incidental findings including extensive atherosclerosis, cholelithiasis and a chronic right adrenal mass. Electronically Signed   By: Richardean Sale M.D.   On: 02/16/2015 09:45   Mr Abdomen Wo Contrast  02/17/2015  CLINICAL DATA:  Evaluate adrenal mass EXAM: MRI ABDOMEN WITHOUT CONTRAST TECHNIQUE: Multiplanar multisequence MR imaging was performed without the administration of intravenous contrast. COMPARISON:  02/16/2015 FINDINGS: Lower chest: Small left effusion and left lower lobe airspace consolidation is noted. Hepatobiliary: Mild hepatic steatosis. There is no focal liver abnormality. Stones are identified within the gallbladder. These measure up to 6 mm. No biliary dilatation. The common bile duct has a normal caliber. Pancreas: Pancreas is unremarkable. Spleen: Negative Adrenals/Urinary Tract: Fat signal intensity within the 2.7 cm right adrenal mass is identified compatible with benign adrenal myelolipoma. Normal appearance of the left adrenal gland. Multiple bilateral renal cysts identified. The largest arises from the upper pole the right kidney measuring 6.3 cm. 5.5 cm cyst arises from the midpole of left kidney. No obstructive uropathy. Stomach/Bowel: The stomach is normal. The visualized upper abdominal bowel loops have a normal caliber. Vascular/Lymphatic: Aortic atherosclerosis  noted. No aneurysm. No upper abdominal adenopathy identified. Other: No ascites or focal fluid collections identified within the upper abdomen. Musculoskeletal: Inferior endplate lesion at the T9 level is again identified. Lesion involving the posterior aspect of the T12 vertebra and right posterior element is again identified suspicious for metastatic disease, image number 15 of series 4. IMPRESSION: 1. 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 2. Mild hepatic steatosis. 3. Gallstones 4. Aortic atherosclerosis 5. T12 bone lesions suspicious for  metastatic disease. 6. T9 compression fracture. Electronically Signed   By: Kerby Moors M.D.   On: 02/17/2015 07:39   Ct Biopsy  02/20/2015  CLINICAL DATA:  Sclerotic bone lesions, anemia, leukocytosis, concern for sclerotic metastases EXAM: CT-GUIDED BIOPSY LEFT ILIAC SCLEROTIC BONE LESION MEDICATIONS AND MEDICAL HISTORY: Versed 1.0 mg, Fentanyl 50 mcg. Additional Medications: None. ANESTHESIA/SEDATION: Moderate sedation time: 11 minutes PROCEDURE: The procedure, risks, benefits, and alternatives were explained to the patient. Questions regarding the procedure were encouraged and answered. The patient understands and consents to the procedure. Previous imaging reviewed. Patient positioned prone. Noncontrast localization CT performed. The sclerotic left iliac bone lesion was localized. The left posterior hip region was prepped with chlorhexidine in a sterile fashion, and a sterile drape was applied covering the operative field. A sterile gown and sterile gloves were used for the procedure. Under CT guidance, a(n) 11 gauge gauge guide needle was advanced into the left iliac sclerotic bone lesion. Position confirmed with CT. 11 gauge core biopsy performed with the drill set. Sample placed in formalin. Final imaging was performed. Patient tolerated the procedure well without complication. Vital sign monitoring by nursing  staff during the procedure will continue as patient is in the special procedures unit for post procedure observation. FINDINGS: The images document guide needle placement within the left iliac sclerotic bone lesion. Post biopsy images demonstrate hemorrhage or hematoma. COMPLICATIONS: None immediate IMPRESSION: Successful CT-guided 11 gauge core biopsy of the left iliac sclerotic bone lesion. Electronically Signed   By: Jerilynn Mages.  Shick M.D.   On: 02/20/2015 17:07   Dg Chest Port 1 View  02/14/2015  CLINICAL DATA:  Pneumonia. EXAM: PORTABLE CHEST 1 VIEW COMPARISON:  02/10/2015 FINDINGS: Left lower lobe infiltrate again identified. This may be slightly smaller and less dense compared to the prior chest x-ray. No edema or pleural fluid. The heart size and mediastinal contours are normal. IMPRESSION: Slightly smaller and less dense appearing left lower lobe pneumonia. Electronically Signed   By: Aletta Edouard M.D.   On: 02/14/2015 15:18     CBC  Recent Labs Lab 02/17/15 0528 02/18/15 0452 02/19/15 0525 02/20/15 0319 02/21/15 0600  WBC 21.3* 18.8* 17.1* 16.9* 18.1*  HGB 8.0* 7.1* 9.2* 8.8* 9.3*  HCT 24.0* 21.2* 28.2* 27.7* 28.9*  PLT 169 126* 152 152 149*  MCV 87.9 88.3 89.0 90.5 92.0  MCH 29.3 29.6 29.0 28.8 29.6  MCHC 33.3 33.5 32.6 31.8 32.2  RDW 15.6* 16.0* 15.2 16.2* 16.6*    Chemistries   Recent Labs Lab 02/16/15 0725 02/17/15 0528 02/18/15 0452 02/19/15 0525 02/22/15 0510  NA 137 140 138 137 136  K 5.0 4.7 4.4 3.5 3.4*  CL 109 110 110 105 104  CO2 23 22 22 23 29   GLUCOSE 224* 192* 176* 189* 180*  BUN 50* 36* 19 12 10   CREATININE 0.92 0.77 0.67 0.58* 0.69  CALCIUM 7.4* 7.5* 7.3* 7.5* 7.8*  AST  --  19 22 24   --   ALT  --  17 19 20   --   ALKPHOS  --  61 62 71  --   BILITOT  --  0.5 0.6 0.5  --    Coagulation profile No results for input(s): INR, PROTIME in the last 168 hours. Cardiac Enzymes No results for input(s): CKMB, TROPONINI, MYOGLOBIN in the last 168  hours.  Invalid input(s): CK  CBG:  Recent Labs Lab 02/21/15 1631 02/21/15 2058 02/22/15 0734 02/22/15 1200 02/22/15 1637  GLUCAP 133* 157* 124* 172* 117*  Studies: No results found.    Lab Results  Component Value Date   HGBA1C 7.1* 02/14/2015   HGBA1C 7.0* 02/10/2015   HGBA1C 6.1* 09/29/2014   Lab Results  Component Value Date   CREATININE 0.69 02/22/2015    Scheduled Meds: . sodium chloride   Intravenous Once  . antiseptic oral rinse  7 mL Mouth Rinse BID  . atorvastatin  10 mg Oral Daily  . budesonide (PULMICORT) nebulizer solution  0.25 mg Nebulization BID  . ceFEPime (MAXIPIME) IV  2 g Intravenous Q12H  . diltiazem  30 mg Oral 4 times per day  . DULoxetine  30 mg Oral QPM  . feeding supplement  1 Container Oral TID BM  . ferrous sulfate  325 mg Oral BID WC  . fluticasone  2 spray Each Nare Daily  . gabapentin  100 mg Oral QHS  . insulin aspart  0-9 Units Subcutaneous TID WC  . latanoprost  1 drop Both Eyes QPM  . levothyroxine  50 mcg Oral QAC breakfast  . pantoprazole  40 mg Oral BID  . polyethylene glycol  17 g Oral Daily  . senna-docusate  2 tablet Oral BID  . vancomycin  1,000 mg Intravenous Q12H   Continuous Infusions:    Principal Problem:   Sepsis ([place]) Active Problems:   Type 2 diabetes mellitus ([place])   Hypothyroidism   Hypertension   Hyperlipidemia   History of depression   COPD with acute exacerbation (HCC)   Low back pain radiating to left leg   DNR (do not resuscitate)   Palliative care encounter   Weakness generalized   Fall   Acute blood loss anemia   Lytic bone lesions on xray    Time spent: 30 minutes   [facility] Pager [phone]. If 7PM-7AM, please contact night-coverage at [email or web address], password [facility] 02/22/2015, 6:41 PM  LOS: 8 days

## 2015-02-22 NOTE — Clinical Social Work Note (Signed)
Clinical Social Work Assessment  Patient Details  Name: Taylor [name] MRN: [id number] Date of Birth: [birth year]  Date of referral:  02/22/15               Reason for consult:  Facility Placement                Permission sought to share information with:  Family Supports Permission granted to share information::  Yes, Verbal Permission Granted  Name::     Ola Spurr  Relationship::  Daughter  Contact Information:  [phone]  Housing/Transportation Living arrangements for the past 2 months:  [place] of Information:  Patient Patient Interpreter Needed:  None Criminal Activity/Legal Involvement Pertinent to Current Situation/Hospitalization:  No - Comment as needed Significant Relationships:  Adult Children Lives with:  Adult Children Do you feel safe going back to the place where you live?  Yes Need for family participation in patient care:  Yes (Comment)  Care giving concerns:  Patient daughter did not return voicemail, however per patient, patient daughter is not available to provide necessary assistance at home.   Social Worker assessment / plan:  Holiday representative met with patient at bedside to offer support and discuss patient needs at discharge.  Patient states that he lives at home with his daughter who works and is unable to provide adequate assistance.  Patient has been to [facility] several times in the past and would like to return if possible.  CSW initiated referral and contacted facility - facility agreeable with patient return if patient is able to pay the $2200 owed prior to admission.  CSW left a voicemail with patient daughter to discuss funds owed and determine best discharge disposition - no return call at this time.  CSW will provide patient and family with additional bed offers and facilitate patient discharge needs.  Employment status:  Retired Nurse, adult PT Recommendations:  [place] / Referral to community resources:  [place]  Patient/Family's Response to care:  Patient understanding of CSW role and agreeable with SNF placement prior to return home.  Patient/Family's Understanding of and Emotional Response to Diagnosis, Current Treatment, and Prognosis:  Patient with limited understanding and poor memory regarding potential long term needs assistance.  Patient will have continued assistance needs at home and plans for daughter to provide necessary assistance.  Emotional Assessment Appearance:  Appears stated age Attitude/Demeanor/Rapport:   (Appropriate and Cooperative) Affect (typically observed):  Appropriate, Calm, Pleasant Orientation:  Oriented to Self, Oriented to Place, Oriented to  Time, Oriented to Situation Alcohol / Substance use:  Not Applicable Psych involvement (Current and /or in the community):  No (Comment)  Discharge Needs  Concerns to be addressed:  Care Coordination Readmission within the last 30 days:  No Current discharge risk:  None Barriers to Discharge:  Continued Medical Work up   [employer], [place]

## 2015-02-22 NOTE — NC FL2 (Signed)
[place] LEVEL OF CARE SCREENING TOOL     IDENTIFICATION  Patient Name: Taylor [name] Birthdate: 27-Sep-1928 Sex: male Admission Date (Current Location): 02/14/2015  [facility] and [place] Number:  Herbalist and Address:  [facility] [place] [place] [address], [place], [place] [address]      Provider Number: [id number]  Attending Physician Name and Address:  Hosie Poisson, MD  Relative Name and Phone Number:       Current Level of Care: Hospital Recommended Level of Care: [place] Prior Approval Number:    Date Approved/Denied:   PASRR Number: [id number]  Discharge Plan: SNF    Current Diagnoses: Patient Active Problem List   Diagnosis Date Noted  . Fall   . Acute blood loss anemia   . Lytic bone lesions on xray   . DNR (do not resuscitate)   . Palliative care encounter   . Weakness generalized   . Adrenal mass ([place])   . Sepsis ([place]) 02/14/2015  . Hyperkalemia 02/10/2015  . CAP (community acquired pneumonia) 02/10/2015  . Pain in the chest   . Primary osteoarthritis of left knee 10/13/2014  . Arthritis of knee 10/11/2014  . Low back pain radiating to left leg 03/02/2014    Class: Acute  . Back pain 03/02/2014  . COPD (chronic obstructive pulmonary disease) ([place]) 08/01/2011  . COPD with acute exacerbation ([place]) 04/12/2011  . Hypoxemia 04/09/2011  . Type 2 diabetes mellitus ([place]) 01/14/2011  . Hypothyroidism 01/14/2011  . Hypertension 01/14/2011  . History of atrial flutter 01/14/2011  . Hyperlipidemia 01/14/2011  . History of depression 01/14/2011  . Vitamin D deficiency 01/14/2011  . History of bladder cancer 01/14/2011  . OSA (obstructive sleep apnea) 01/14/2011  . Peripheral edema 01/14/2011    Orientation RESPIRATION BLADDER Height & Weight    Self, Time, Situation, Place  O2 (2L) External catheter '5\' 7"'$  (170.2 cm) 205 lbs.  BEHAVIORAL SYMPTOMS/MOOD NEUROLOGICAL BOWEL NUTRITION STATUS   Continent Diet (Heart Healthy / Carb Modified)  AMBULATORY STATUS COMMUNICATION OF NEEDS Skin   Limited Assist Verbally Surgical wounds (Lower Back Incision)                       Personal Care Assistance Level of Assistance  Bathing, Feeding, Dressing Bathing Assistance: Limited assistance Feeding assistance: Independent Dressing Assistance: Limited assistance     Functional Limitations Info  Sight, Hearing, Speech Sight Info: Adequate Hearing Info: Adequate Speech Info: Adequate    SPECIAL CARE FACTORS FREQUENCY  PT (By licensed PT), OT (By licensed OT)     PT Frequency: 3 OT Frequency: 3            Contractures Contractures Info: Not present    Additional Factors Info  Code Status, Allergies, Isolation Precautions, Insulin Sliding Scale, Psychotropic Code Status Info: DNR Allergies Info: Amoxicillin, Motrin, Chantix Psychotropic Info: Cymbalta Insulin Sliding Scale Info: Novolog 3 times daily with meals Isolation Precautions Info: MRSA by surgical pcr 09/29/14     Current Medications (02/22/2015):  This is the current hospital active medication list Current Facility-Administered Medications  Medication Dose Route Frequency Provider Last Rate Last Dose  . 0.9 %  sodium chloride infusion   Intravenous Once Reyne Dumas, MD      . acetaminophen (TYLENOL) tablet 650 mg  650 mg Oral Q6H PRN Reyne Dumas, MD   650 mg at 02/17/15 2004   Or  . acetaminophen (TYLENOL) suppository 650 mg  650 mg Rectal Q6H PRN Reyne Dumas, MD      . albuterol (PROVENTIL) (2.5 MG/3ML) 0.083% nebulizer solution 2.5 mg  2.5 mg Nebulization Q6H PRN Reyne Dumas, MD      . ALPRAZolam Duanne Moron) tablet 0.25 mg  0.25 mg Oral QHS PRN Reyne Dumas, MD   0.25 mg at 02/21/15 2127  . antiseptic oral rinse (CPC / CETYLPYRIDINIUM CHLORIDE 0.05%) solution 7 mL  7 mL Mouth Rinse BID Reyne Dumas, MD   7 mL at 02/22/15 1000  . atorvastatin (LIPITOR) tablet 10 mg  10 mg Oral Daily Reyne Dumas, MD   10 mg  at 02/22/15 1009  . budesonide (PULMICORT) nebulizer solution 0.25 mg  0.25 mg Nebulization BID Barton Dubois, MD   0.25 mg at 02/21/15 2015  . ceFEPIme (MAXIPIME) 2 g in dextrose 5 % 50 mL IVPB  2 g Intravenous Q12H Barton Dubois, MD   2 g at 02/22/15 0254  . diltiazem (CARDIZEM) tablet 30 mg  30 mg Oral 4 times per day Reyne Dumas, MD   30 mg at 02/22/15 1215  . DULoxetine (CYMBALTA) DR capsule 30 mg  30 mg Oral QPM Reyne Dumas, MD   30 mg at 02/21/15 1723  . feeding supplement (ENSURE ENLIVE) (ENSURE ENLIVE) liquid 237 mL  237 mL Oral BID BM Reyne Dumas, MD   237 mL at 02/19/15 1500  . feeding supplement (GLUCERNA SHAKE) (GLUCERNA SHAKE) liquid 237 mL  237 mL Oral TID BM Satira Anis Ward, RD   237 mL at 02/20/15 2029  . ferrous sulfate tablet 325 mg  325 mg Oral BID WC Reyne Dumas, MD   325 mg at 02/22/15 0753  . fluticasone (FLONASE) 50 MCG/ACT nasal spray 2 spray  2 spray Each Nare Daily Reyne Dumas, MD   2 spray at 02/22/15 1009  . gabapentin (NEURONTIN) capsule 100 mg  100 mg Oral QHS Reyne Dumas, MD   100 mg at 02/21/15 2127  . insulin aspart (novoLOG) injection 0-9 Units  0-9 Units Subcutaneous TID WC Reyne Dumas, MD   2 Units at 02/22/15 1215  . latanoprost (XALATAN) 0.005 % ophthalmic solution 1 drop  1 drop Both Eyes QPM Reyne Dumas, MD   1 drop at 02/21/15 1724  . levalbuterol (XOPENEX) nebulizer solution 0.63 mg  0.63 mg Nebulization Q6H PRN Reyne Dumas, MD      . levothyroxine (SYNTHROID, LEVOTHROID) tablet 50 mcg  50 mcg Oral QAC breakfast Reyne Dumas, MD   50 mcg at 02/22/15 0753  . ondansetron (ZOFRAN) tablet 4 mg  4 mg Oral Q6H PRN Reyne Dumas, MD       Or  . ondansetron (ZOFRAN) injection 4 mg  4 mg Intravenous Q6H PRN Reyne Dumas, MD   4 mg at 02/15/15 2137  . oxyCODONE-acetaminophen (PERCOCET/ROXICET) 5-325 MG per tablet 1 tablet  1 tablet Oral Q8H PRN Reyne Dumas, MD   1 tablet at 02/21/15 2127  . pantoprazole (PROTONIX) EC tablet 40 mg  40 mg Oral BID Hosie Poisson, MD      . polyethylene glycol (MIRALAX / GLYCOLAX) packet 17 g  17 g Oral Daily Reyne Dumas, MD   17 g at 02/22/15 0758  . vancomycin (VANCOCIN) IVPB 1000 mg/200 mL premix  1,000 mg Intravenous Q12H Barton Dubois, MD 200 mL/hr at 02/22/15 0334 1,000 mg at 02/22/15 0334     Discharge Medications: Please see discharge summary for a list of discharge medications.  Relevant Imaging Results:  Relevant Lab Results:  Additional Information SSN [id number]  Barbette Or, [place]

## 2015-02-23 ENCOUNTER — Telehealth: Payer: Self-pay | Admitting: Oncology

## 2015-02-23 ENCOUNTER — Encounter: Payer: Self-pay | Admitting: *Deleted

## 2015-02-23 DIAGNOSIS — J441 Chronic obstructive pulmonary disease with (acute) exacerbation: Secondary | ICD-10-CM

## 2015-02-23 DIAGNOSIS — I10 Essential (primary) hypertension: Secondary | ICD-10-CM

## 2015-02-23 DIAGNOSIS — W19XXXD Unspecified fall, subsequent encounter: Secondary | ICD-10-CM

## 2015-02-23 LAB — BASIC METABOLIC PANEL
Anion gap: 10 (ref 5–15)
BUN: 11 mg/dL (ref 6–20)
CALCIUM: 8.3 mg/dL — AB (ref 8.9–10.3)
CO2: 29 mmol/L (ref 22–32)
CREATININE: 0.72 mg/dL (ref 0.61–1.24)
Chloride: 99 mmol/L — ABNORMAL LOW (ref 101–111)
GFR calc Af Amer: >60 mL/min (ref >60)
GLUCOSE: 203 mg/dL — AB (ref 65–99)
POTASSIUM: 3.6 mmol/L (ref 3.5–5.1)
SODIUM: 138 mmol/L (ref 135–145)

## 2015-02-23 LAB — GLUCOSE, CAPILLARY
GLUCOSE-CAPILLARY: 122 mg/dL — AB (ref 65–99)
GLUCOSE-CAPILLARY: 185 mg/dL — AB (ref 65–99)
GLUCOSE-CAPILLARY: 160 mg/dL — AB (ref 65–99)
GLUCOSE-CAPILLARY: 176 mg/dL — AB (ref 65–99)

## 2015-02-23 LAB — CBC
HEMATOCRIT: 28.4 % — AB (ref 39.0–52.0)
Hemoglobin: 8.9 g/dL — ABNORMAL LOW (ref 13.0–17.0)
MCH: 29.2 pg (ref 26.0–34.0)
MCHC: 31.3 g/dL (ref 30.0–36.0)
MCV: 93.1 fL (ref 78.0–100.0)
Platelets: 177 10*3/uL (ref 150–400)
RBC: 3.05 MIL/uL — ABNORMAL LOW (ref 4.22–5.81)
RDW: 16.4 % — AB (ref 11.5–15.5)
WBC: 15.4 10*3/uL — AB (ref 4.0–10.5)

## 2015-02-23 MED ORDER — PANTOPRAZOLE SODIUM 40 MG PO TBEC: 40.0000 mg | DELAYED_RELEASE_TABLET | Freq: Two times a day (BID) | ORAL | Status: AC

## 2015-02-23 MED ORDER — BUDESONIDE 0.25 MG/2ML IN SUSP: 0.2500 mg | Freq: Two times a day (BID) | RESPIRATORY_TRACT | Status: AC

## 2015-02-23 MED ORDER — DILTIAZEM HCL ER COATED BEADS 120 MG PO CP24: 120.0000 mg | ORAL_CAPSULE | Freq: Every day | ORAL | Status: AC

## 2015-02-23 MED ORDER — OXYCODONE-ACETAMINOPHEN 5-325 MG PO TABS: ORAL_TABLET | ORAL | Status: AC

## 2015-02-23 MED ORDER — SENNOSIDES-DOCUSATE SODIUM 8.6-50 MG PO TABS: 2.0000 | ORAL_TABLET | Freq: Two times a day (BID) | ORAL | Status: AC

## 2015-02-23 MED ORDER — BOOST / RESOURCE BREEZE PO LIQD: 1.0000 | Freq: Three times a day (TID) | ORAL | Status: AC

## 2015-02-23 NOTE — Discharge Summary (Signed)
Physician Discharge Summary  Taylor [name] XNT:[id number] DOB: 03-09-1928 DOA: 02/14/2015  PCP: [name], MD  Admit date: 02/14/2015 Discharge date: 02/23/2015  Time spent: 30  minutes  Recommendations for Outpatient Follow-up:  1. Follow upw ith Dr [name] re the bone biopsy report.  2. Please follow up with PCP in one week.  3. Please follow up with palliative care services on discharge to SNF.  4. Check hemoglobin periodically to make sure it is stable.    Discharge Diagnoses:  Principal Problem:   Sepsis ([place]) Active Problems:   Type 2 diabetes mellitus (HCC)   Hypothyroidism   Hypertension   Hyperlipidemia   History of depression   COPD with acute exacerbation (HCC)   Low back pain radiating to left leg   DNR (do not resuscitate)   Palliative care encounter   Weakness generalized   Fall   Acute blood loss anemia   Lytic bone lesions on xray   Discharge Condition: improved  Diet recommendation: CARB modified diet.   Filed Weights   02/21/15 0500 02/22/15 0605 02/23/15 0510  Weight: 93.532 kg (206 lb 3.2 oz) 93.169 kg (205 lb 6.4 oz) 92.715 kg (204 lb 6.4 oz)    History of present illness:   82 year old male with a history of paroxysmal atrial fibrillation on xarelto, COPD, on nocturnal oxygen 2.5 L, diabetes mellitus, hypertension, recently admitted 1/6-1/8 for community-acquired pneumonia, left lower lobe, discharged home on levofloxacin for 7 days, who presents with weakness, persistent nausea, syncopal episode at home, and the patient remembers hitting the back of his head. Patient was found on the floor by his family, daughter brings him in states that patient has been weak and bed bound since his discharge. Due to nausea the patient has not been eating or drinking. Patient has a history of chronic low back pain, followed by Dr. [name], had an MRI 2 weeks ago, was found to have lesions concerning for metastatic disease and was referred to Dr.  [name] for further evaluation. Patient has been evaluated by oncology Dr. [name] during this admission.  GI was consulted because the patient was noted to have 2 episodes of black tarry stools. Dr. [name] will not attempt EGD due to tenuous pulmonary status.  He underwent  IR guided  bone biopsyand results are pending.   If Hgb stabilizes and no further active bleeding; can be discharge to SNF for rehab and follow up with oncology as an outpatient. Requested palliative care consult and plan for palliative care services to follow with the patient  On discharge to SNF.   Hospital Course:  Sepsis with hypovolemic shock, hemorrhagic shock Resolved  -Likely in the setting of recent left lower lobe pneumonia with incomplete resolution, ongoing bleeding  -lactate on admit 2.86 >1.6 -Pro calcitonin <0.10 -Blood culture from 1/6 , 1/10 remain negative -Completed 9 days of IV antibiotics for sepsis coverage -per Dr. [name] who recently performed MRI of the patient's lumbar spine and there was no evidence of discitis -CT chest showed left lower lobe pneumonia  Syncope likely secondary to orthostatic hypotension in the setting of pneumonia/sepsis, GI bleeding, underlying malignancy. -negative troponin, 2-D echo [showed EF of 60-65%], doubt PE, as patient was getting xarelto prior to admission -CT head negative    Community-acquired pneumonia (diagnosed 1/6) with underlying history of COPD, chronic respiratory failure -Completed the course of antibiotics.  -Previous Influenza panel negative, urine strep antigen negative.  -Previous Pro calcitonin less than 0.1, lactic acid 1.0.  -  Previous Blood cultures remain negative to date. -will continue neb treatments and added pulmicort -oxygen supplementation off during the day and patient with good saturation   Hyperkalemia  -Previously K+ 6.5 , secondary to dehydration -resolved and WNL since then -will monitor  Severe COPD  with chronic respiratory failure -Using 1-2 Lpm supplemental O2 at night while home  -will continue duo nebs, wean O2 as tolerated to baseline -will continue flutter valve and pulmicort  -no wheezing today   Type II DM  - resume metformin on discharge.  - Continue sliding scale insulin, hemoglobin A1c 7.0 - CBG (last 3)   Recent Labs (last 2 labs)      Recent Labs  02/22/15 0734 02/22/15 1200 02/22/15 1637  GLUCAP 124* 172* 117*        Fall -given that the patient is on xarelto CT head done to rule out intracranial bleeding. -neg images studies for acute abnormalities  -PT evaluation requested and recommending SNF; patient in agreement, discussed with family, plan for discharge today.  Normocytic Anemia , likely now with acute blood loss anemia, malignancy  -Hemoglobin was 10.8 on 1/6, dropped to 6.7 on 1/ 11,status posttransfusion of 5 units of packed red blood cells (last transfusion on 1/14) -Discontinued xarelto,  -last hemoglobin at 8.9 -CT abd/pelvis showed possible inflammation of the distal stomach and proximal duodenum,unable to do EGD secondary to tenuous resp status as per GI rec's. -CT also showed lytic and sclerotic lesions in the spine and ribs and pelvis suspicious for metastatic disease or multiple myeloma  -Repeat SPEP /UPEP,Pending, oncology following , recommended outpatient follow up.  -s/p bone biopsy on 1/16 (pathology pending), outpatient follow up of the results of the bone biopsy.  - resume PPI ON discharge, changed to oral .   Paroxysmal atrial fib  -discontinued Xarelto due to bleeding -continue cardizem 120 mg daily. Decreased the dose from 180 mg to 120 mg daily.  -CHADS vasc 2, patient was not on any anticoagulation prior to his left knee total arthroplasty and was placed on xarelto for DVT prophylaxis.  Rate controlled.   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 [name] has talked with IR for bone biopsy, underwent bone biopsy on Monday 1/16.  -not safe to have EGD per Dr. Benson Norway.         Procedures:  Bone biopsy by IR. On 1/16  Consultations:  Oncology. Dr [name].  Gastroenterology with Dr Benson Norway.   pALLIATIVE CARE CONSULT.   Discharge Exam: Filed Vitals:   02/23/15 0510 02/23/15 1000  BP: 125/58 118/59  Pulse: 79   Temp: 98.1 F (36.7 C)   Resp: 18     General: alert comfortable, sitting in the chair.  Cardiovascular: s1s2 Respiratory: clear to auscultation, no wheezing or rhonchi.   Discharge Instructions   Discharge Instructions    Diet - low sodium heart healthy    Complete by:  As directed      Discharge instructions    Complete by:  As directed   Please follow up the results of the bone biopsy with Dr [name].  We have stopped xarelto in view of GI bleed.  Check cbc and bmp in one week.          Current Discharge Medication List    START taking these medications   Details  budesonide (PULMICORT)  0.25 MG/2ML nebulizer solution Take 2 mLs (0.25 mg total) by nebulization 2 (two) times daily. Qty: 60 mL, Refills: 12    feeding supplement (BOOST / RESOURCE BREEZE) LIQD Take 1 Container by mouth 3 (three) times daily between meals. Refills: 0    pantoprazole (PROTONIX) 40 MG tablet Take 1 tablet (40 mg total) by mouth 2 (two) times daily.    senna-docusate (SENOKOT-S) 8.6-50 MG tablet Take 2 tablets by mouth 2 (two) times daily.      CONTINUE these medications which have CHANGED   Details  diltiazem (CARDIZEM CD) 120 MG 24 hr capsule Take 1 capsule (120 mg total) by mouth daily.    oxyCODONE-acetaminophen (PERCOCET) 5-325 MG tablet Take one tablet by mouth every 4 hours as needed for moderate pain. Do not exceed 4gm of Tylenol in 24 hours Qty: 10 tablet, Refills: 0       CONTINUE these medications which have NOT CHANGED   Details  albuterol (PROVENTIL HFA;VENTOLIN HFA) 108 (90 BASE) MCG/ACT inhaler Inhale 2 puffs into the lungs every 4 (four) hours as needed for wheezing or shortness of breath.    albuterol (PROVENTIL) (2.5 MG/3ML) 0.083% nebulizer solution Take 2.5 mg by nebulization every 6 (six) hours as needed for shortness of breath.  Refills: 12    alendronate (FOSAMAX) 70 MG tablet Take 70 mg by mouth once a week. Monday Refills: 1    ALPRAZolam (XANAX) 0.25 MG tablet Take one tablet by mouth every night at bedtime as needed for anxiety Qty: 30 tablet, Refills: 5    atorvastatin (LIPITOR) 10 MG tablet Take 10 mg by mouth daily.    Calcium Carbonate-Vitamin D (CALCIUM + D PO) Take 2 tablets by mouth daily.    DULoxetine (CYMBALTA) 30 MG capsule Take 30 mg by mouth every evening.     ferrous sulfate 325 (65 FE) MG EC tablet Take 325 mg by mouth 2 (two) times daily.    fluticasone (FLONASE) 50 MCG/ACT nasal spray Place 2 sprays into the nose daily.      gabapentin (NEURONTIN) 100 MG capsule Take 100 mg by mouth at bedtime.    ketoconazole (NIZORAL) 2 % cream Apply 2 application topically as needed. rash Refills: 3    latanoprost (XALATAN) 0.005 % ophthalmic solution Place 1 drop into both eyes every evening.     levothyroxine (SYNTHROID, LEVOTHROID) 100 MCG tablet Take 50-100 mcg by mouth daily before breakfast. Takes 38mg on sun and mon Takes 1011m all other days    metFORMIN (GLUCOPHAGE) 500 MG tablet Take 500 mg by mouth daily.     OXYGEN Inhale 2.5 L into the lungs at bedtime. Only use oxygen at night    polyethylene glycol (MIRALAX / GLYCOLAX) packet Take 17 g by mouth daily.        STOP taking these medications     hydrochlorothiazide (MICROZIDE) 12.5 MG capsule      levofloxacin (LEVAQUIN) 750 MG tablet      rivaroxaban (XARELTO) 10 MG TABS tablet        Allergies  Allergen Reactions  . Amoxicillin Palpitations  .  Motrin [Ibuprofen] Palpitations  . Chantix [Varenicline Tartrate] Other (See Comments)    depression   Follow-up Information    Follow up with SOSheela StackMD. Schedule an appointment as soon as possible for a visit in 1 week.   Specialty:  Endocrinology   Contact information:   [id number]     Follow up  with SHADAD,FIRAS, MD. Schedule an appointment as soon as possible for a visit in 1 week.   Specialty:  Oncology   Contact information:   [place]. [place] [address] [phone]        The results of significant diagnostics from this hospitalization (including imaging, microbiology, ancillary and laboratory) are listed below for reference.    Significant Diagnostic Studies: Ct Abdomen Pelvis Wo Contrast  02/16/2015  CLINICAL DATA:  Anemia, leukocytosis and tarry stools. Recent admission for sepsis. History of COPD, diabetes and hypertension. History of bladder cancer and plasma cell disorder. EXAM: CT CHEST, ABDOMEN AND PELVIS WITHOUT CONTRAST TECHNIQUE: Multidetector CT imaging of the chest, abdomen and pelvis was performed following the standard protocol without IV contrast. COMPARISON:  Chest CT 12/15/2007, abdominal pelvic CT 06/30/2014 and chest radiographs 02/10/2015. Skeletal survey 05/24/2014. FINDINGS: CT CHEST Mediastinum/Nodes: There are no enlarged mediastinal, hilar or axillary lymph nodes. Small mediastinal lymph nodes are stable. The thyroid gland, trachea and esophagus demonstrate no significant findings. The heart size is normal. There is no pericardial effusion. There is extensive atherosclerosis of the aorta, great vessels and coronary arteries. Lungs/Pleura: There is minimal dependent pleural fluid bilaterally. As demonstrated on recent radiographs, there is airspace disease with consolidation and air bronchograms in the left lower lobe. There is minimal dependent atelectasis at the right lung base. A 6 mm left  upper lobe nodule on image number 16 is stable. There are no new or enlarging pulmonary nodules. Mild emphysematous changes are present. Musculoskeletal/Chest wall: There is a T9 compression fracture which appears new and likely pathologic. There are multiple irregular lucent and sclerotic lesions throughout the thoracic spine and ribs. Small purely lytic lesion noted anteriorly in the left third rib (image 21). CT ABDOMEN AND PELVIS FINDINGS Hepatobiliary: As evaluated in the noncontrast state, the liver appears stable without suspicious findings. Small calcified gallstones are again noted. There is no gallbladder wall thickening or significant biliary dilatation. Pancreas: Unremarkable. No pancreatic ductal dilatation or surrounding inflammatory changes. Spleen: Normal in size without focal abnormality. Adrenals/Urinary Tract: Chronic right adrenal mass appears unchanged, measuring 3.4 x 3.1 cm on image 51. This contains calcifications, soft tissue and fatty components. 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 The left adrenal gland appears normal. Multiple bilateral renal cysts are again noted, measuring up to 6.0 cm in the lower pole of the right kidney and up to 5.8 cm in the interpolar region of the left kidney. Both kidneys demonstrate mild cortical thinning. There is no evidence of urinary tract calculus or hydronephrosis. The bladder appears normal. Stomach/Bowel: There appears to be mild soft tissue stranding around the distal stomach, proximal duodenum and pancreatic head. This is suboptimally evaluated due to the lack of intravenous and oral contrast. There is no extraluminal fluid collection. No evidence of bowel wall thickening, distention or surrounding inflammatory change. Stable diverticular changes of the sigmoid colon. Vascular/Lymphatic: There are no enlarged abdominal or pelvic lymph nodes. Stable extensive atherosclerosis of the aorta, its branches and  the iliac arteries. There is stable dilatation of the right common iliac artery. Reproductive: Unremarkable. Other: Stable asymmetric fat in the left inguinal canal and stable small umbilical hernia containing only fat. No evidence of ascites. Musculoskeletal: There are several new mixed lytic and sclerotic lesions within the lumbar spine. There is a dominant 2.2 cm sclerotic lesion in the S1 segment. Postsurgical changes are noted in the lower lumbar spine. There is no evidence of  lumbar pathologic fracture or epidural tumor. IMPRESSION: 1. Possible inflammation involving the distal stomach and proximal duodenum in this patient with tarry stools. This is suboptimally evaluated by this examination. Consider endoscopic correlation. 2. The remainder of the small bowel and colon demonstrate no significant findings. There is distal colonic diverticulosis. 3. Left lower lobe pneumonia.  Small bilateral pleural effusions. 4. Multiple lytic and sclerotic lesions in the spine, ribs and pelvis, suspicious for metastatic disease or multiple myeloma. Correlate clinically. Probable pathologic fracture at T9. 5. Stable additional incidental findings including extensive atherosclerosis, cholelithiasis and a chronic right adrenal mass. Electronically Signed   By: Richardean Sale M.D.   On: 02/16/2015 09:45   Dg Chest 2 View  02/10/2015  CLINICAL DATA:  Left-sided chest pain in the mornings for the last few days. EXAM: CHEST  2 VIEW COMPARISON:  04/12/2011 FINDINGS: Heart size is normal. There is atherosclerosis of the aorta. The right lung is clear. There is abnormal density in the left lower lobe posteriorly most consistent with bronchopneumonia. Follow-up to clearing recommended to ensure there is not a mass lesion. No effusion. No acute bone finding. IMPRESSION: Left lower lobe infiltrate most consistent with bronchopneumonia. Follow-up to clearing to rule out underlying mass. Electronically Signed   By: Nelson Chimes M.D.    On: 02/10/2015 15:12   Ct Head Wo Contrast  02/14/2015  CLINICAL DATA:  Syncope and headache. EXAM: CT HEAD WITHOUT CONTRAST TECHNIQUE: Contiguous axial images were obtained from the base of the skull through the vertex without intravenous contrast. 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 Stable cortical atrophy. The brain demonstrates no evidence of hemorrhage, acute infarction, edema, mass effect, extra-axial fluid collection, hydrocephalus or mass lesion. The skull is unremarkable. IMPRESSION: No acute findings. 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 Electronically Signed   By: Aletta Edouard M.D.   On: 02/14/2015 17:51   Ct Chest Wo Contrast  02/16/2015  CLINICAL DATA:  Anemia, leukocytosis and tarry stools. Recent admission for sepsis. History of COPD, diabetes and hypertension. History of bladder cancer and plasma cell disorder. EXAM: CT CHEST, ABDOMEN AND PELVIS WITHOUT CONTRAST TECHNIQUE: Multidetector CT imaging of the chest, abdomen and pelvis was performed following the standard protocol without IV contrast. COMPARISON:  Chest CT 12/15/2007, abdominal pelvic CT 06/30/2014 and chest radiographs 02/10/2015. Skeletal survey 05/24/2014. FINDINGS: CT CHEST Mediastinum/Nodes: There are no enlarged mediastinal, hilar or axillary lymph nodes. Small mediastinal lymph nodes are stable. The thyroid gland, trachea and esophagus demonstrate no significant findings. The heart size is normal. There is no pericardial effusion. There is extensive atherosclerosis of the aorta, great vessels and coronary arteries. Lungs/Pleura: There is minimal dependent pleural fluid bilaterally. As demonstrated on recent radiographs, there is airspace disease with consolidation and air bronchograms in the left lower lobe. There is minimal dependent atelectasis at the right lung base. A 6 mm left upper lobe nodule  on image number 16 is stable. There are no new or enlarging pulmonary nodules. Mild emphysematous changes are present. Musculoskeletal/Chest wall: There is a T9 compression fracture which appears new and likely pathologic. There are multiple irregular lucent and sclerotic lesions throughout the thoracic spine and ribs. Small purely lytic lesion noted anteriorly in the left third rib (image 21). CT ABDOMEN AND PELVIS FINDINGS Hepatobiliary: As evaluated in the noncontrast state, the liver appears stable without suspicious findings. Small calcified gallstones are again  noted. There is no gallbladder wall thickening or significant biliary dilatation. Pancreas: Unremarkable. No pancreatic ductal dilatation or surrounding inflammatory changes. Spleen: Normal in size without focal abnormality. Adrenals/Urinary Tract: Chronic right adrenal mass appears unchanged, measuring 3.4 x 3.1 cm on image 51. This contains calcifications, soft tissue and fatty components. 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 The left adrenal gland appears normal. Multiple bilateral renal cysts are again noted, measuring up to 6.0 cm in the lower pole of the right kidney and up to 5.8 cm in the interpolar region of the left kidney. Both kidneys demonstrate mild cortical thinning. There is no evidence of urinary tract calculus or hydronephrosis. The bladder appears normal. Stomach/Bowel: There appears to be mild soft tissue stranding around the distal stomach, proximal duodenum and pancreatic head. This is suboptimally evaluated due to the lack of intravenous and oral contrast. There is no extraluminal fluid collection. No evidence of bowel wall thickening, distention or surrounding inflammatory change. Stable diverticular changes of the sigmoid colon. Vascular/Lymphatic: There are no enlarged abdominal or pelvic lymph nodes. Stable extensive atherosclerosis of the aorta, its branches and the iliac  arteries. There is stable dilatation of the right common iliac artery. Reproductive: Unremarkable. Other: Stable asymmetric fat in the left inguinal canal and stable small umbilical hernia containing only fat. No evidence of ascites. Musculoskeletal: There are several new mixed lytic and sclerotic lesions within the lumbar spine. There is a dominant 2.2 cm sclerotic lesion in the S1 segment. Postsurgical changes are noted in the lower lumbar spine. There is no evidence of lumbar pathologic fracture or epidural tumor. IMPRESSION: 1. Possible inflammation involving the distal stomach and proximal duodenum in this patient with tarry stools. This is suboptimally evaluated by this examination. Consider endoscopic correlation. 2. The remainder of the small bowel and colon demonstrate no significant findings. There is distal colonic diverticulosis. 3. Left lower lobe pneumonia.  Small bilateral pleural effusions. 4. Multiple lytic and sclerotic lesions in the spine, ribs and pelvis, suspicious for metastatic disease or multiple myeloma. Correlate clinically. Probable pathologic fracture at T9. 5. Stable additional incidental findings including extensive atherosclerosis, cholelithiasis and a chronic right adrenal mass. Electronically Signed   By: Richardean Sale M.D.   On: 02/16/2015 09:45   Mr Abdomen Wo Contrast  02/17/2015  CLINICAL DATA:  Evaluate adrenal mass EXAM: MRI ABDOMEN WITHOUT CONTRAST TECHNIQUE: Multiplanar multisequence MR imaging was performed without the administration of intravenous contrast. COMPARISON:  02/16/2015 FINDINGS: Lower chest: Small left effusion and left lower lobe airspace consolidation is noted. Hepatobiliary: Mild hepatic steatosis. There is no focal liver abnormality. Stones are identified within the gallbladder. These measure up to 6 mm. No biliary dilatation. The common bile duct has a normal caliber. Pancreas: Pancreas is unremarkable. Spleen: Negative Adrenals/Urinary Tract: Fat  signal intensity within the 2.7 cm right adrenal mass is identified compatible with benign adrenal myelolipoma. Normal appearance of the left adrenal gland. Multiple bilateral renal cysts identified. The largest arises from the upper pole the right kidney measuring 6.3 cm. 5.5 cm cyst arises from the midpole of left kidney. No obstructive uropathy. Stomach/Bowel: The stomach is normal. The visualized upper abdominal bowel loops have a normal caliber. Vascular/Lymphatic: Aortic atherosclerosis noted. No aneurysm. No upper abdominal adenopathy identified. Other: No ascites or focal fluid collections identified within the upper abdomen. Musculoskeletal: Inferior endplate lesion at the T9 level is again identified. Lesion involving the posterior aspect of the T12 vertebra and right posterior element  is again identified suspicious for metastatic disease, image number 15 of series 4. IMPRESSION: 1. 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 2. Mild hepatic steatosis. 3. Gallstones 4. Aortic atherosclerosis 5. T12 bone lesions suspicious for metastatic disease. 6. T9 compression fracture. Electronically Signed   By: Kerby Moors M.D.   On: 02/17/2015 07:39   Ct Biopsy  02/20/2015  CLINICAL DATA:  Sclerotic bone lesions, anemia, leukocytosis, concern for sclerotic metastases EXAM: CT-GUIDED BIOPSY LEFT ILIAC SCLEROTIC BONE LESION MEDICATIONS AND MEDICAL HISTORY: Versed 1.0 mg, Fentanyl 50 mcg. Additional Medications: None. ANESTHESIA/SEDATION: Moderate sedation time: 11 minutes PROCEDURE: The procedure, risks, benefits, and alternatives were explained to the patient. Questions regarding the procedure were encouraged and answered. The patient understands and consents to the procedure. Previous imaging reviewed. Patient positioned prone. Noncontrast localization CT performed. The sclerotic left iliac bone lesion was localized. The left posterior hip region was prepped with  chlorhexidine in a sterile fashion, and a sterile drape was applied covering the operative field. A sterile gown and sterile gloves were used for the procedure. Under CT guidance, a(n) 11 gauge gauge guide needle was advanced into the left iliac sclerotic bone lesion. Position confirmed with CT. 11 gauge core biopsy performed with the drill set. Sample placed in formalin. Final imaging was performed. Patient tolerated the procedure well without complication. Vital sign monitoring by nursing staff during the procedure will continue as patient is in the special procedures unit for post procedure observation. FINDINGS: The images document guide needle placement within the left iliac sclerotic bone lesion. Post biopsy images demonstrate hemorrhage or hematoma. COMPLICATIONS: None immediate IMPRESSION: Successful CT-guided 11 gauge core biopsy of the left iliac sclerotic bone lesion. Electronically Signed   By: Jerilynn Mages.  Shick M.D.   On: 02/20/2015 17:07   Dg Chest Port 1 View  02/14/2015  CLINICAL DATA:  Pneumonia. EXAM: PORTABLE CHEST 1 VIEW COMPARISON:  02/10/2015 FINDINGS: Left lower lobe infiltrate again identified. This may be slightly smaller and less dense compared to the prior chest x-ray. No edema or pleural fluid. The heart size and mediastinal contours are normal. IMPRESSION: Slightly smaller and less dense appearing left lower lobe pneumonia. Electronically Signed   By: Aletta Edouard M.D.   On: 02/14/2015 15:18    Microbiology: Recent Results (from the past 240 hour(s))  Blood Culture (routine x 2)     Status: None   Collection Time: 02/14/15  3:05 PM  Result Value Ref Range Status   Specimen Description BLOOD LEFT HAND  Final   Special Requests BOTTLES DRAWN AEROBIC AND ANAEROBIC 4CC  Final   Culture NO GROWTH 5 DAYS  Final   Report Status 02/19/2015 FINAL  Final  Blood Culture (routine x 2)     Status: None   Collection Time: 02/14/15  3:30 PM  Result Value Ref Range Status   Specimen  Description BLOOD LEFT WRIST  Final   Special Requests BOTTLES DRAWN AEROBIC AND ANAEROBIC 5CC  Final   Culture NO GROWTH 5 DAYS  Final   Report Status 02/19/2015 FINAL  Final  Urine culture     Status: None   Collection Time: 02/14/15  6:41 PM  Result Value Ref Range Status   Specimen Description URINE, RANDOM  Final   Special Requests NONE  Final   Culture 3,000 COLONIES/mL INSIGNIFICANT GROWTH  Final   Report Status 02/15/2015 FINAL  Final  Urine culture     Status: None   Collection Time: 02/14/15  10:12 PM  Result Value Ref Range Status   Specimen Description URINE, RANDOM  Final   Special Requests NONE  Final   Culture NO GROWTH 2 DAYS  Final   Report Status 02/16/2015 FINAL  Final  Urine culture     Status: None   Collection Time: 02/16/15  7:20 AM  Result Value Ref Range Status   Specimen Description URINE, RANDOM  Final   Special Requests NONE  Final   Culture NO GROWTH 1 DAY  Final   Report Status 02/17/2015 FINAL  Final     Labs: Basic Metabolic Panel:  Recent Labs Lab 02/17/15 0528 02/18/15 0452 02/19/15 0525 02/22/15 0510 02/23/15 0950  NA 140 138 137 136 138  K 4.7 4.4 3.5 3.4* 3.6  CL 110 110 105 104 99*  CO2 _0 GLUCOSE 192* 176* 189* 180* 203*  BUN 36* _1 CREATININE 0.77 0.67 0.58* 0.69 0.72  CALCIUM 7.5* 7.3* 7.5* 7.8* 8.3*   Liver Function Tests:  Recent Labs Lab 02/17/15 0528 02/18/15 0452 02/19/15 0525  AST _2 ALT _3 ALKPHOS 61 62 71  BILITOT 0.5 0.6 0.5  PROT 4.4* 4.7* 4.8*  ALBUMIN 1.7* 1.6* 1.7*   No results for input(s): LIPASE, AMYLASE in the last 168 hours. No results for input(s): AMMONIA in the last 168 hours. CBC:  Recent Labs Lab 02/18/15 0452 02/19/15 0525 02/20/15 0319 02/21/15 0600 02/23/15 0424  WBC 18.8* 17.1* 16.9* 18.1* 15.4*  HGB 7.1* 9.2* 8.8* 9.3* 8.9*  HCT 21.2* 28.2* 27.7* 28.9* 28.4*  MCV 88.3 89.0 90.5 92.0 93.1  PLT 126* 152 152 149* 177   Cardiac Enzymes: No  results for input(s): CKTOTAL, CKMB, CKMBINDEX, TROPONINI in the last 168 hours. BNP: BNP (last 3 results)  Recent Labs  02/10/15 2155  BNP 47.8    ProBNP (last 3 results) No results for input(s): PROBNP in the last 8760 hours.  CBG:  Recent Labs Lab 02/22/15 0734 02/22/15 1200 02/22/15 1637 02/22/15 2114 02/23/15 0736  GLUCAP 124* 172* 117* 163* 122*       Signed:  Lochlyn Zullo MD.  [facility] 02/23/2015, 11:35 AM

## 2015-02-23 NOTE — Telephone Encounter (Signed)
Called and left a message with 1/23 appointment per pof

## 2015-02-23 NOTE — Clinical Social Work Note (Signed)
CSW was notified that daughter planned to appeal discharge. Daughter has decided that she no longer wants to do this. CSW shared with patient and daughter this morning that [facility] will not accept patient without patient paying off the balance he owes the facility. CSW unable to discharge patient today because daughter had not made necessary arrangements with any of the available bed offers. CSW informed daughter that patient will be discharge in the morning and that CSW will need choice of facility by 10:30AM. CSW will follow up in the AM.  Liz Beach MSW, [place], [place], [phone]

## 2015-02-23 NOTE — Care Management Important Message (Signed)
Important Message  Patient Details  Name: Taylor [name] MRN: [id number] Date of Birth: [birth year]   Medicare Important Message Given:  Yes    Bethena Roys, RN 02/23/2015, 11:29 AM

## 2015-02-24 LAB — GLUCOSE, CAPILLARY: Glucose-Capillary: 132 mg/dL — ABNORMAL HIGH (ref 65–99)

## 2015-02-24 MED ORDER — ALPRAZOLAM 0.25 MG PO TABS: ORAL_TABLET | ORAL | Status: AC

## 2015-02-24 NOTE — Clinical Social Work Note (Signed)
Per MD, patient ready to discharge to [facility] [place], patient/family, and facility notified of patient's discharge. RN given number for report. DC packet on chart. Ambulance transport requested. Family has completed necessary paperwork. BSW intern signing off.   Raynelle Highland BSW Intern, [phone]

## 2015-02-24 NOTE — Discharge Summary (Signed)
Physician Discharge Summary  Taylor [name] UQJ:[id number] DOB: 1928-06-01 DOA: 02/14/2015  PCP: [name], MD  Admit date: 02/14/2015 Discharge date: 02/24/2015  Time spent: 30  minutes  Recommendations for Outpatient Follow-up:  1. Follow upw ith Dr [name] re the bone biopsy report.  2. Please follow up with PCP in one week.  3. Please follow up with palliative care services on discharge to SNF.  4. Check hemoglobin periodically to make sure it is stable.    Discharge Diagnoses:  Principal Problem:   Sepsis ([place]) Active Problems:   Type 2 diabetes mellitus (HCC)   Hypothyroidism   Hypertension   Hyperlipidemia   History of depression   COPD with acute exacerbation (HCC)   Low back pain radiating to left leg   DNR (do not resuscitate)   Palliative care encounter   Weakness generalized   Fall   Acute blood loss anemia   Lytic bone lesions on xray   Discharge Condition: improved  Diet recommendation: CARB modified diet.   Filed Weights   02/22/15 0605 02/23/15 0510 02/24/15 0701  Weight: 93.169 kg (205 lb 6.4 oz) 92.715 kg (204 lb 6.4 oz) 94.348 kg (208 lb)    History of present illness:   82 year old male with a history of paroxysmal atrial fibrillation on xarelto, COPD, on nocturnal oxygen 2.5 L, diabetes mellitus, hypertension, recently admitted 1/6-1/8 for community-acquired pneumonia, left lower lobe, discharged home on levofloxacin for 7 days, who presents with weakness, persistent nausea, syncopal episode at home, and the patient remembers hitting the back of his head. Patient was found on the floor by his family, daughter brings him in states that patient has been weak and bed bound since his discharge. Due to nausea the patient has not been eating or drinking. Patient has a history of chronic low back pain, followed by Dr. [name], had an MRI 2 weeks ago, was found to have lesions concerning for metastatic disease and was referred to Dr. [name] for  further evaluation. Patient has been evaluated by oncology Dr. [name] during this admission.  GI was consulted because the patient was noted to have 2 episodes of black tarry stools. Dr. [name] will not attempt EGD due to tenuous pulmonary status.  He underwent  IR guided  bone biopsyand results are pending.   If Hgb stabilizes and no further active bleeding; can be discharge to SNF for rehab and follow up with oncology as an outpatient. Requested palliative care consult and plan for palliative care services to follow with the patient  On discharge to SNF.   Hospital Course:  Sepsis with hypovolemic shock, hemorrhagic shock Resolved  -Likely in the setting of recent left lower lobe pneumonia with incomplete resolution, ongoing bleeding  -lactate on admit 2.86 >1.6 -Pro calcitonin <0.10 -Blood culture from 1/6 , 1/10 remain negative -Completed 9 days of IV antibiotics for sepsis coverage -per Dr. [name] who recently performed MRI of the patient's lumbar spine and there was no evidence of discitis -CT chest showed left lower lobe pneumonia  Syncope likely secondary to orthostatic hypotension in the setting of pneumonia/sepsis, GI bleeding, underlying malignancy. -negative troponin, 2-D echo [showed EF of 60-65%], doubt PE, as patient was getting xarelto prior to admission -CT head negative    Community-acquired pneumonia (diagnosed 1/6) with underlying history of COPD, chronic respiratory failure -Completed the course of antibiotics.  -Previous Influenza panel negative, urine strep antigen negative.  -Previous Pro calcitonin less than 0.1, lactic acid 1.0.  -Previous  Blood cultures remain negative to date. -will continue neb treatments and added pulmicort -oxygen supplementation off during the day and patient with good saturation   Hyperkalemia  -Previously K+ 6.5 , secondary to dehydration -resolved and WNL since then -will monitor  Severe COPD with  chronic respiratory failure -Using 1-2 Lpm supplemental O2 at night while home  -will continue duo nebs, wean O2 as tolerated to baseline -will continue flutter valve and pulmicort  -no wheezing today   Type II DM  - resume metformin on discharge.  - Continue sliding scale insulin, hemoglobin A1c 7.0 - CBG (last 3)   Recent Labs (last 2 labs)      Recent Labs  02/22/15 0734 02/22/15 1200 02/22/15 1637  GLUCAP 124* 172* 117*        Fall -given that the patient is on xarelto CT head done to rule out intracranial bleeding. -neg images studies for acute abnormalities  -PT evaluation requested and recommending SNF; patient in agreement, discussed with family, plan for discharge today.  Normocytic Anemia , likely now with acute blood loss anemia, malignancy  -Hemoglobin was 10.8 on 1/6, dropped to 6.7 on 1/ 11,status posttransfusion of 5 units of packed red blood cells (last transfusion on 1/14) -Discontinued xarelto,  -last hemoglobin at 8.9 -CT abd/pelvis showed possible inflammation of the distal stomach and proximal duodenum,unable to do EGD secondary to tenuous resp status as per GI rec's. -CT also showed lytic and sclerotic lesions in the spine and ribs and pelvis suspicious for metastatic disease or multiple myeloma  -Repeat SPEP /UPEP,Pending, oncology following , recommended outpatient follow up.  -s/p bone biopsy on 1/16 (pathology pending), outpatient follow up of the results of the bone biopsy.  - resume PPI ON discharge, changed to oral .   Paroxysmal atrial fib  -discontinued Xarelto due to bleeding -continue cardizem 120 mg daily. Decreased the dose from 180 mg to 120 mg daily.  -CHADS vasc 2, patient was not on any anticoagulation prior to his left knee total arthroplasty and was placed on xarelto for DVT prophylaxis.  Rate controlled.   Hypothyroidism Continue Synthroid  Adrenal mass, on MRI of the lumbar spine -MRI abdomen shows  right adrenal gland benign adrenal myolipoma  T12 bone lesion suspicious for metastatic disease, T9 compression fracture -Dr. [name] has talked with IR for bone biopsy, underwent bone biopsy on Monday 1/16.  -not safe to have EGD per Dr. Benson Norway.         Procedures:  Bone biopsy by IR. On 1/16  Consultations:  Oncology. Dr [name].  Gastroenterology with Dr Benson Norway.   pALLIATIVE CARE CONSULT.   Discharge Exam: Filed Vitals:   02/24/15 0701 02/24/15 0811  BP: 116/57 131/61  Pulse: 83   Temp: 99 F (37.2 C)   Resp: 16     General: alert comfortable, sitting in the chair.  Cardiovascular: s1s2 Respiratory: clear to auscultation, no wheezing or rhonchi.   Discharge Instructions   Discharge Instructions    Diet - low sodium heart healthy    Complete by:  As directed      Discharge instructions    Complete by:  As directed   Please follow up the results of the bone biopsy with Dr [name].  We have stopped xarelto in view of GI bleed.  Check cbc and bmp in one week.          Current Discharge Medication List    START taking these medications   Details  budesonide (PULMICORT) 0.25  MG/2ML nebulizer solution Take 2 mLs (0.25 mg total) by nebulization 2 (two) times daily. Qty: 60 mL, Refills: 12    feeding supplement (BOOST / RESOURCE BREEZE) LIQD Take 1 Container by mouth 3 (three) times daily between meals. Refills: 0    pantoprazole (PROTONIX) 40 MG tablet Take 1 tablet (40 mg total) by mouth 2 (two) times daily.    senna-docusate (SENOKOT-S) 8.6-50 MG tablet Take 2 tablets by mouth 2 (two) times daily.      CONTINUE these medications which have CHANGED   Details  ALPRAZolam (XANAX) 0.25 MG tablet Take one tablet by mouth every night at bedtime as needed for anxiety Qty: 10 tablet, Refills: 0    diltiazem (CARDIZEM CD) 120 MG 24 hr capsule Take 1 capsule (120 mg total) by mouth daily.    oxyCODONE-acetaminophen (PERCOCET) 5-325 MG tablet Take one tablet by  mouth every 4 hours as needed for moderate pain. Do not exceed 4gm of Tylenol in 24 hours Qty: 10 tablet, Refills: 0      CONTINUE these medications which have NOT CHANGED   Details  albuterol (PROVENTIL HFA;VENTOLIN HFA) 108 (90 BASE) MCG/ACT inhaler Inhale 2 puffs into the lungs every 4 (four) hours as needed for wheezing or shortness of breath.    albuterol (PROVENTIL) (2.5 MG/3ML) 0.083% nebulizer solution Take 2.5 mg by nebulization every 6 (six) hours as needed for shortness of breath.  Refills: 12    alendronate (FOSAMAX) 70 MG tablet Take 70 mg by mouth once a week. Monday Refills: 1    atorvastatin (LIPITOR) 10 MG tablet Take 10 mg by mouth daily.    Calcium Carbonate-Vitamin D (CALCIUM + D PO) Take 2 tablets by mouth daily.    DULoxetine (CYMBALTA) 30 MG capsule Take 30 mg by mouth every evening.     ferrous sulfate 325 (65 FE) MG EC tablet Take 325 mg by mouth 2 (two) times daily.    fluticasone (FLONASE) 50 MCG/ACT nasal spray Place 2 sprays into the nose daily.      gabapentin (NEURONTIN) 100 MG capsule Take 100 mg by mouth at bedtime.    ketoconazole (NIZORAL) 2 % cream Apply 2 application topically as needed. rash Refills: 3    latanoprost (XALATAN) 0.005 % ophthalmic solution Place 1 drop into both eyes every evening.     levothyroxine (SYNTHROID, LEVOTHROID) 100 MCG tablet Take 50-100 mcg by mouth daily before breakfast. Takes 67mg on sun and mon Takes 1075m all other days    metFORMIN (GLUCOPHAGE) 500 MG tablet Take 500 mg by mouth daily.     OXYGEN Inhale 2.5 L into the lungs at bedtime. Only use oxygen at night    polyethylene glycol (MIRALAX / GLYCOLAX) packet Take 17 g by mouth daily.        STOP taking these medications     hydrochlorothiazide (MICROZIDE) 12.5 MG capsule      levofloxacin (LEVAQUIN) 750 MG tablet      rivaroxaban (XARELTO) 10 MG TABS tablet        Allergies  Allergen Reactions  . Amoxicillin Palpitations  . Motrin  [Ibuprofen] Palpitations  . Chantix [Varenicline Tartrate] Other (See Comments)    depression   Follow-up Information    Follow up with SOSheela StackMD. Schedule an appointment as soon as possible for a visit in 1 week.   Specialty:  Endocrinology   Contact information:   [phone]     Follow up with  TGYBWL,SLHTD, MD. Schedule an appointment as soon as possible for a visit in 1 week.   Specialty:  Oncology   Contact information:   [address]. [place] [address] [phone]        The results of significant diagnostics from this hospitalization (including imaging, microbiology, ancillary and laboratory) are listed below for reference.    Significant Diagnostic Studies: Ct Abdomen Pelvis Wo Contrast  02/16/2015  CLINICAL DATA:  Anemia, leukocytosis and tarry stools. Recent admission for sepsis. History of COPD, diabetes and hypertension. History of bladder cancer and plasma cell disorder. EXAM: CT CHEST, ABDOMEN AND PELVIS WITHOUT CONTRAST TECHNIQUE: Multidetector CT imaging of the chest, abdomen and pelvis was performed following the standard protocol without IV contrast. COMPARISON:  Chest CT 12/15/2007, abdominal pelvic CT 06/30/2014 and chest radiographs 02/10/2015. Skeletal survey 05/24/2014. FINDINGS: CT CHEST Mediastinum/Nodes: There are no enlarged mediastinal, hilar or axillary lymph nodes. Small mediastinal lymph nodes are stable. The thyroid gland, trachea and esophagus demonstrate no significant findings. The heart size is normal. There is no pericardial effusion. There is extensive atherosclerosis of the aorta, great vessels and coronary arteries. Lungs/Pleura: There is minimal dependent pleural fluid bilaterally. As demonstrated on recent radiographs, there is airspace disease with consolidation and air bronchograms in the left lower lobe. There is minimal dependent atelectasis at the right lung base. A 6 mm left upper  lobe nodule on image number 16 is stable. There are no new or enlarging pulmonary nodules. Mild emphysematous changes are present. Musculoskeletal/Chest wall: There is a T9 compression fracture which appears new and likely pathologic. There are multiple irregular lucent and sclerotic lesions throughout the thoracic spine and ribs. Small purely lytic lesion noted anteriorly in the left third rib (image 21). CT ABDOMEN AND PELVIS FINDINGS Hepatobiliary: As evaluated in the noncontrast state, the liver appears stable without suspicious findings. Small calcified gallstones are again noted. There is no gallbladder wall thickening or significant biliary dilatation. Pancreas: Unremarkable. No pancreatic ductal dilatation or surrounding inflammatory changes. Spleen: Normal in size without focal abnormality. Adrenals/Urinary Tract: Chronic right adrenal mass appears unchanged, measuring 3.4 x 3.1 cm on image 51. This contains calcifications, soft tissue and fatty components. 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 The left adrenal gland appears normal. Multiple bilateral renal cysts are again noted, measuring up to 6.0 cm in the lower pole of the right kidney and up to 5.8 cm in the interpolar region of the left kidney. Both kidneys demonstrate mild cortical thinning. There is no evidence of urinary tract calculus or hydronephrosis. The bladder appears normal. Stomach/Bowel: There appears to be mild soft tissue stranding around the distal stomach, proximal duodenum and pancreatic head. This is suboptimally evaluated due to the lack of intravenous and oral contrast. There is no extraluminal fluid collection. No evidence of bowel wall thickening, distention or surrounding inflammatory change. Stable diverticular changes of the sigmoid colon. Vascular/Lymphatic: There are no enlarged abdominal or pelvic lymph nodes. Stable extensive atherosclerosis of the aorta, its branches and the  iliac arteries. There is stable dilatation of the right common iliac artery. Reproductive: Unremarkable. Other: Stable asymmetric fat in the left inguinal canal and stable small umbilical hernia containing only fat. No evidence of ascites. Musculoskeletal: There are several new mixed lytic and sclerotic lesions within the lumbar spine. There is a dominant 2.2 cm sclerotic lesion in the S1 segment. Postsurgical changes are noted in the lower lumbar spine. There is no evidence of lumbar  pathologic fracture or epidural tumor. IMPRESSION: 1. Possible inflammation involving the distal stomach and proximal duodenum in this patient with tarry stools. This is suboptimally evaluated by this examination. Consider endoscopic correlation. 2. The remainder of the small bowel and colon demonstrate no significant findings. There is distal colonic diverticulosis. 3. Left lower lobe pneumonia.  Small bilateral pleural effusions. 4. Multiple lytic and sclerotic lesions in the spine, ribs and pelvis, suspicious for metastatic disease or multiple myeloma. Correlate clinically. Probable pathologic fracture at T9. 5. Stable additional incidental findings including extensive atherosclerosis, cholelithiasis and a chronic right adrenal mass. Electronically Signed   By: Richardean Sale M.D.   On: 02/16/2015 09:45   Dg Chest 2 View  02/10/2015  CLINICAL DATA:  Left-sided chest pain in the mornings for the last few days. EXAM: CHEST  2 VIEW COMPARISON:  04/12/2011 FINDINGS: Heart size is normal. There is atherosclerosis of the aorta. The right lung is clear. There is abnormal density in the left lower lobe posteriorly most consistent with bronchopneumonia. Follow-up to clearing recommended to ensure there is not a mass lesion. No effusion. No acute bone finding. IMPRESSION: Left lower lobe infiltrate most consistent with bronchopneumonia. Follow-up to clearing to rule out underlying mass. Electronically Signed   By: Nelson Chimes M.D.   On:  02/10/2015 15:12   Ct Head Wo Contrast  02/14/2015  CLINICAL DATA:  Syncope and headache. EXAM: CT HEAD WITHOUT CONTRAST TECHNIQUE: Contiguous axial images were obtained from the base of the skull through the vertex without intravenous contrast. 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 Stable cortical atrophy. The brain demonstrates no evidence of hemorrhage, acute infarction, edema, mass effect, extra-axial fluid collection, hydrocephalus or mass lesion. The skull is unremarkable. IMPRESSION: No acute findings. 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 Electronically Signed   By: Aletta Edouard M.D.   On: 02/14/2015 17:51   Ct Chest Wo Contrast  02/16/2015  CLINICAL DATA:  Anemia, leukocytosis and tarry stools. Recent admission for sepsis. History of COPD, diabetes and hypertension. History of bladder cancer and plasma cell disorder. EXAM: CT CHEST, ABDOMEN AND PELVIS WITHOUT CONTRAST TECHNIQUE: Multidetector CT imaging of the chest, abdomen and pelvis was performed following the standard protocol without IV contrast. COMPARISON:  Chest CT 12/15/2007, abdominal pelvic CT 06/30/2014 and chest radiographs 02/10/2015. Skeletal survey 05/24/2014. FINDINGS: CT CHEST Mediastinum/Nodes: There are no enlarged mediastinal, hilar or axillary lymph nodes. Small mediastinal lymph nodes are stable. The thyroid gland, trachea and esophagus demonstrate no significant findings. The heart size is normal. There is no pericardial effusion. There is extensive atherosclerosis of the aorta, great vessels and coronary arteries. Lungs/Pleura: There is minimal dependent pleural fluid bilaterally. As demonstrated on recent radiographs, there is airspace disease with consolidation and air bronchograms in the left lower lobe. There is minimal dependent atelectasis at the right lung base. A 6 mm left upper lobe nodule on  image number 16 is stable. There are no new or enlarging pulmonary nodules. Mild emphysematous changes are present. Musculoskeletal/Chest wall: There is a T9 compression fracture which appears new and likely pathologic. There are multiple irregular lucent and sclerotic lesions throughout the thoracic spine and ribs. Small purely lytic lesion noted anteriorly in the left third rib (image 21). CT ABDOMEN AND PELVIS FINDINGS Hepatobiliary: As evaluated in the noncontrast state, the liver appears stable without suspicious findings. Small calcified gallstones are again noted.  There is no gallbladder wall thickening or significant biliary dilatation. Pancreas: Unremarkable. No pancreatic ductal dilatation or surrounding inflammatory changes. Spleen: Normal in size without focal abnormality. Adrenals/Urinary Tract: Chronic right adrenal mass appears unchanged, measuring 3.4 x 3.1 cm on image 51. This contains calcifications, soft tissue and fatty components. 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 The left adrenal gland appears normal. Multiple bilateral renal cysts are again noted, measuring up to 6.0 cm in the lower pole of the right kidney and up to 5.8 cm in the interpolar region of the left kidney. Both kidneys demonstrate mild cortical thinning. There is no evidence of urinary tract calculus or hydronephrosis. The bladder appears normal. Stomach/Bowel: There appears to be mild soft tissue stranding around the distal stomach, proximal duodenum and pancreatic head. This is suboptimally evaluated due to the lack of intravenous and oral contrast. There is no extraluminal fluid collection. No evidence of bowel wall thickening, distention or surrounding inflammatory change. Stable diverticular changes of the sigmoid colon. Vascular/Lymphatic: There are no enlarged abdominal or pelvic lymph nodes. Stable extensive atherosclerosis of the aorta, its branches and the iliac arteries.  There is stable dilatation of the right common iliac artery. Reproductive: Unremarkable. Other: Stable asymmetric fat in the left inguinal canal and stable small umbilical hernia containing only fat. No evidence of ascites. Musculoskeletal: There are several new mixed lytic and sclerotic lesions within the lumbar spine. There is a dominant 2.2 cm sclerotic lesion in the S1 segment. Postsurgical changes are noted in the lower lumbar spine. There is no evidence of lumbar pathologic fracture or epidural tumor. IMPRESSION: 1. Possible inflammation involving the distal stomach and proximal duodenum in this patient with tarry stools. This is suboptimally evaluated by this examination. Consider endoscopic correlation. 2. The remainder of the small bowel and colon demonstrate no significant findings. There is distal colonic diverticulosis. 3. Left lower lobe pneumonia.  Small bilateral pleural effusions. 4. Multiple lytic and sclerotic lesions in the spine, ribs and pelvis, suspicious for metastatic disease or multiple myeloma. Correlate clinically. Probable pathologic fracture at T9. 5. Stable additional incidental findings including extensive atherosclerosis, cholelithiasis and a chronic right adrenal mass. Electronically Signed   By: Richardean Sale M.D.   On: 02/16/2015 09:45   Mr Abdomen Wo Contrast  02/17/2015  CLINICAL DATA:  Evaluate adrenal mass EXAM: MRI ABDOMEN WITHOUT CONTRAST TECHNIQUE: Multiplanar multisequence MR imaging was performed without the administration of intravenous contrast. COMPARISON:  02/16/2015 FINDINGS: Lower chest: Small left effusion and left lower lobe airspace consolidation is noted. Hepatobiliary: Mild hepatic steatosis. There is no focal liver abnormality. Stones are identified within the gallbladder. These measure up to 6 mm. No biliary dilatation. The common bile duct has a normal caliber. Pancreas: Pancreas is unremarkable. Spleen: Negative Adrenals/Urinary Tract: Fat signal  intensity within the 2.7 cm right adrenal mass is identified compatible with benign adrenal myelolipoma. Normal appearance of the left adrenal gland. Multiple bilateral renal cysts identified. The largest arises from the upper pole the right kidney measuring 6.3 cm. 5.5 cm cyst arises from the midpole of left kidney. No obstructive uropathy. Stomach/Bowel: The stomach is normal. The visualized upper abdominal bowel loops have a normal caliber. Vascular/Lymphatic: Aortic atherosclerosis noted. No aneurysm. No upper abdominal adenopathy identified. Other: No ascites or focal fluid collections identified within the upper abdomen. Musculoskeletal: Inferior endplate lesion at the T9 level is again identified. Lesion involving the posterior aspect of the T12 vertebra and right posterior element is  again identified suspicious for metastatic disease, image number 15 of series 4. IMPRESSION: 1. 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 2. Mild hepatic steatosis. 3. Gallstones 4. Aortic atherosclerosis 5. T12 bone lesions suspicious for metastatic disease. 6. T9 compression fracture. Electronically Signed   By: Kerby Moors M.D.   On: 02/17/2015 07:39   Ct Biopsy  02/20/2015  CLINICAL DATA:  Sclerotic bone lesions, anemia, leukocytosis, concern for sclerotic metastases EXAM: CT-GUIDED BIOPSY LEFT ILIAC SCLEROTIC BONE LESION MEDICATIONS AND MEDICAL HISTORY: Versed 1.0 mg, Fentanyl 50 mcg. Additional Medications: None. ANESTHESIA/SEDATION: Moderate sedation time: 11 minutes PROCEDURE: The procedure, risks, benefits, and alternatives were explained to the patient. Questions regarding the procedure were encouraged and answered. The patient understands and consents to the procedure. Previous imaging reviewed. Patient positioned prone. Noncontrast localization CT performed. The sclerotic left iliac bone lesion was localized. The left posterior hip region was prepped with  chlorhexidine in a sterile fashion, and a sterile drape was applied covering the operative field. A sterile gown and sterile gloves were used for the procedure. Under CT guidance, a(n) 11 gauge gauge guide needle was advanced into the left iliac sclerotic bone lesion. Position confirmed with CT. 11 gauge core biopsy performed with the drill set. Sample placed in formalin. Final imaging was performed. Patient tolerated the procedure well without complication. Vital sign monitoring by nursing staff during the procedure will continue as patient is in the special procedures unit for post procedure observation. FINDINGS: The images document guide needle placement within the left iliac sclerotic bone lesion. Post biopsy images demonstrate hemorrhage or hematoma. COMPLICATIONS: None immediate IMPRESSION: Successful CT-guided 11 gauge core biopsy of the left iliac sclerotic bone lesion. Electronically Signed   By: Jerilynn Mages.  Shick M.D.   On: 02/20/2015 17:07   Dg Chest Port 1 View  02/14/2015  CLINICAL DATA:  Pneumonia. EXAM: PORTABLE CHEST 1 VIEW COMPARISON:  02/10/2015 FINDINGS: Left lower lobe infiltrate again identified. This may be slightly smaller and less dense compared to the prior chest x-ray. No edema or pleural fluid. The heart size and mediastinal contours are normal. IMPRESSION: Slightly smaller and less dense appearing left lower lobe pneumonia. Electronically Signed   By: Aletta Edouard M.D.   On: 02/14/2015 15:18    Microbiology: Recent Results (from the past 240 hour(s))  Blood Culture (routine x 2)     Status: None   Collection Time: 02/14/15  3:05 PM  Result Value Ref Range Status   Specimen Description BLOOD LEFT HAND  Final   Special Requests BOTTLES DRAWN AEROBIC AND ANAEROBIC 4CC  Final   Culture NO GROWTH 5 DAYS  Final   Report Status 02/19/2015 FINAL  Final  Blood Culture (routine x 2)     Status: None   Collection Time: 02/14/15  3:30 PM  Result Value Ref Range Status   Specimen  Description BLOOD LEFT WRIST  Final   Special Requests BOTTLES DRAWN AEROBIC AND ANAEROBIC 5CC  Final   Culture NO GROWTH 5 DAYS  Final   Report Status 02/19/2015 FINAL  Final  Urine culture     Status: None   Collection Time: 02/14/15  6:41 PM  Result Value Ref Range Status   Specimen Description URINE, RANDOM  Final   Special Requests NONE  Final   Culture 3,000 COLONIES/mL INSIGNIFICANT GROWTH  Final   Report Status 02/15/2015 FINAL  Final  Urine culture     Status: None   Collection Time: 02/14/15 10:12  PM  Result Value Ref Range Status   Specimen Description URINE, RANDOM  Final   Special Requests NONE  Final   Culture NO GROWTH 2 DAYS  Final   Report Status 02/16/2015 FINAL  Final  Urine culture     Status: None   Collection Time: 02/16/15  7:20 AM  Result Value Ref Range Status   Specimen Description URINE, RANDOM  Final   Special Requests NONE  Final   Culture NO GROWTH 1 DAY  Final   Report Status 02/17/2015 FINAL  Final     Labs: Basic Metabolic Panel:  Recent Labs Lab 02/18/15 0452 02/19/15 0525 02/22/15 0510 02/23/15 0950  NA 138 137 136 138  K 4.4 3.5 3.4* 3.6  CL 110 105 104 99*  CO2 22 23 29 29   GLUCOSE 176* 189* 180* 203*  BUN 19 12 10 11   CREATININE 0.67 0.58* 0.69 0.72  CALCIUM 7.3* 7.5* 7.8* 8.3*   Liver Function Tests:  Recent Labs Lab 02/18/15 0452 02/19/15 0525  AST 22 24  ALT 19 20  ALKPHOS 62 71  BILITOT 0.6 0.5  PROT 4.7* 4.8*  ALBUMIN 1.6* 1.7*   No results for input(s): LIPASE, AMYLASE in the last 168 hours. No results for input(s): AMMONIA in the last 168 hours. CBC:  Recent Labs Lab 02/18/15 0452 02/19/15 0525 02/20/15 0319 02/21/15 0600 02/23/15 0424  WBC 18.8* 17.1* 16.9* 18.1* 15.4*  HGB 7.1* 9.2* 8.8* 9.3* 8.9*  HCT 21.2* 28.2* 27.7* 28.9* 28.4*  MCV 88.3 89.0 90.5 92.0 93.1  PLT 126* 152 152 149* 177   Cardiac Enzymes: No results for input(s): CKTOTAL, CKMB, CKMBINDEX, TROPONINI in the last 168  hours. BNP: BNP (last 3 results)  Recent Labs  02/10/15 2155  BNP 47.8    ProBNP (last 3 results) No results for input(s): PROBNP in the last 8760 hours.  CBG:  Recent Labs Lab 02/23/15 0736 02/23/15 1131 02/23/15 1705 02/23/15 2155 02/24/15 0744  GLUCAP 122* 185* 160* 176* 132*       Signed:  Glee Lashomb MD.  [facility] 02/24/2015, 11:26 AM  Patient seen and examined today 1/20 We have obtained results of the bone biopsy and is positive for adenocarcinoma, The tumor is positive for cytokeratin 7 and TTF-1, while it is negative for CDX-2, prostein and cytokeratin 20. The staining pattern favors an adenocarcinoma of lung primary source.. Discussed the results with the patient and his daughter. Plan for outpatient follow up with Dr [name] on Monday at 8;30 am.  As per the patient he reported that he does not want any aggressive treatment for the adenocarcinoma.  Plan for SNF when bed available.  No new changes in medications.    Hosie Poisson, MD [phone]

## 2015-02-24 NOTE — Clinical Social Work Placement (Signed)
   CLINICAL SOCIAL WORK PLACEMENT  NOTE  Date:  02/24/2015  Patient Details  Name: Taylor [name] MRN: [id number] Date of Birth: 12/10/1928  Clinical Social Work is seeking post-discharge placement for this patient at the [place] level of care (*CSW will initial, date and re-position this form in  chart as items are completed):  Yes   Patient/family provided with [place] Work Department's list of facilities offering this level of care within the geographic area requested by the patient (or if unable, by the patient's family).  Yes   Patient/family informed of their freedom to choose among providers that offer the needed level of care, that participate in Medicare, Medicaid or managed care program needed by the patient, have an available bed and are willing to accept the patient.  Yes   Patient/family informed of [facility]'s ownership interest in [facility] and [facility], as well as of the fact that they are under no obligation to receive care at these facilities.  PASRR submitted to EDS on       PASRR number received on       Existing PASRR number confirmed on 02/22/15     FL2 transmitted to all facilities in geographic area requested by pt/family on 02/22/15     FL2 transmitted to all facilities within larger geographic area on       Patient informed that his/her managed care company has contracts with or will negotiate with certain facilities, including the following:        Yes   Patient/family informed of bed offers received.  Patient chooses bed at [facility]     Physician recommends and patient chooses bed at      Patient to be transferred to [facility] on 02/24/15.  Patient to be transferred to facility by [facility]     Patient family notified on 02/24/15 of transfer.  Name of family member notified:  Melissa     PHYSICIAN       Additional Comment:    Raynelle [place] Intern,  [phone]

## 2015-02-24 NOTE — Progress Notes (Signed)
Called report to [employer] [facility]. Patient IV's removed intact, telemetry discontinued. Patient transported by [facility].

## 2015-02-27 ENCOUNTER — Non-Acute Institutional Stay (SKILLED_NURSING_FACILITY): Payer: Medicare Other | Admitting: Adult Health

## 2015-02-27 ENCOUNTER — Telehealth: Payer: Self-pay | Admitting: Oncology

## 2015-02-27 ENCOUNTER — Ambulatory Visit (HOSPITAL_BASED_OUTPATIENT_CLINIC_OR_DEPARTMENT_OTHER): Payer: Medicare Other | Admitting: Oncology

## 2015-02-27 ENCOUNTER — Encounter: Payer: Self-pay | Admitting: Adult Health

## 2015-02-27 VITALS — BP 123/66 | HR 90 | Temp 98.0°F | Resp 18 | Ht 67.0 in | Wt 203.0 lb

## 2015-02-27 DIAGNOSIS — C7951 Secondary malignant neoplasm of bone: Secondary | ICD-10-CM

## 2015-02-27 DIAGNOSIS — E785 Hyperlipidemia, unspecified: Secondary | ICD-10-CM

## 2015-02-27 DIAGNOSIS — J440 Chronic obstructive pulmonary disease with acute lower respiratory infection: Secondary | ICD-10-CM

## 2015-02-27 DIAGNOSIS — I483 Typical atrial flutter: Secondary | ICD-10-CM | POA: Diagnosis not present

## 2015-02-27 DIAGNOSIS — C799 Secondary malignant neoplasm of unspecified site: Secondary | ICD-10-CM | POA: Diagnosis not present

## 2015-02-27 DIAGNOSIS — C3490 Malignant neoplasm of unspecified part of unspecified bronchus or lung: Secondary | ICD-10-CM

## 2015-02-27 DIAGNOSIS — E038 Other specified hypothyroidism: Secondary | ICD-10-CM

## 2015-02-27 DIAGNOSIS — D649 Anemia, unspecified: Secondary | ICD-10-CM | POA: Diagnosis not present

## 2015-02-27 DIAGNOSIS — E1100 Type 2 diabetes mellitus with hyperosmolarity without nonketotic hyperglycemic-hyperosmolar coma (NKHHC): Secondary | ICD-10-CM

## 2015-02-27 DIAGNOSIS — E034 Atrophy of thyroid (acquired): Secondary | ICD-10-CM | POA: Diagnosis not present

## 2015-02-27 DIAGNOSIS — C801 Malignant (primary) neoplasm, unspecified: Secondary | ICD-10-CM

## 2015-02-27 DIAGNOSIS — M545 Low back pain, unspecified: Secondary | ICD-10-CM

## 2015-02-27 DIAGNOSIS — D62 Acute posthemorrhagic anemia: Secondary | ICD-10-CM | POA: Diagnosis not present

## 2015-02-27 DIAGNOSIS — I10 Essential (primary) hypertension: Secondary | ICD-10-CM

## 2015-02-27 DIAGNOSIS — I4892 Unspecified atrial flutter: Secondary | ICD-10-CM | POA: Insufficient documentation

## 2015-02-27 DIAGNOSIS — M79605 Pain in left leg: Secondary | ICD-10-CM

## 2015-02-27 NOTE — Telephone Encounter (Signed)
Scheduled patient appt per pof, avs report printed.  °

## 2015-02-27 NOTE — Progress Notes (Signed)
Hematology and Oncology Follow Up Visit  Taylor [name] [id number] 80-05-1928 82 y.o. 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 He presented with bony metastasis including the ribs, spine and pelvis.   Prior Therapy: He is status post a biopsy done on 02/20/2015 which confirmed the presence of adenocarcinoma.  Current therapy: Supportive care only for the time being.  Interim History: Mr. Taylor [name] presents today for a follow-up visit. Since his last visit, he was hospitalized between 02/14/2015 and 02/24/2015. He presented with syncope and found to have anemia likely related to a bleeding ulcer. Part of his workup. A CT scan chest abdomen and pelvis which showed sclerotic lesions in multiple areas including the ribs and spine. Biopsy on 02/20/2015 confirmed the presence of adenocarcinoma with his workup revealing that his tumor is positive for cytokeratin 7 and TTF-1 and negative for CDX-2 indicating a lung primary. Since his discharge, he currently residing at a skilled nursing facility and giving physical therapy although not necessarily improving. He still rather weak and debilitated but appears comfortable. His appetite is reasonable and was able to get food and keep it down. He does have some memory issues but no alteration of mental status.  He does not report any headaches, blurry vision, syncope or seizures. He does not report any fevers, chills or sweats but does report decline in his appetite. He does not report any chest pain, cough or wheezing. Does not report any hemoptysis. Does not report any palpitation, orthopnea or leg edema. Does not report any nausea, vomiting or abdominal pain. Does not report any early satiety, constipation or diarrhea. Does not report any frequency urgency or hesitancy. Remaining review of systems  unremarkable.  Medications: I have reviewed the patient's current medications.  Current Outpatient Prescriptions  Medication Sig Dispense Refill  . albuterol (PROVENTIL HFA;VENTOLIN HFA) 108 (90 BASE) MCG/ACT inhaler Inhale 2 puffs into the lungs every 4 (four) hours as needed for wheezing or shortness of breath.    Marland Kitchen albuterol (PROVENTIL) (2.5 MG/3ML) 0.083% nebulizer solution Take 2.5 mg by nebulization every 6 (six) hours as needed for shortness of breath.   12  . alendronate (FOSAMAX) 70 MG tablet Take 70 mg by mouth once a week. Monday  1  . ALPRAZolam (XANAX) 0.25 MG tablet Take one tablet by mouth every night at bedtime as needed for anxiety 10 tablet 0  . atorvastatin (LIPITOR) 10 MG tablet Take 10 mg by mouth daily.    . budesonide (PULMICORT) 0.25 MG/2ML nebulizer solution Take 2 mLs (0.25 mg total) by nebulization 2 (two) times daily. 60 mL 12  . Calcium Carbonate-Vitamin D (CALCIUM + D PO) Take 2 tablets by mouth daily.    Marland Kitchen diltiazem (CARDIZEM CD) 120 MG 24 hr capsule Take 1 capsule (120 mg total) by mouth daily.    . DULoxetine (CYMBALTA) 30 MG capsule Take 30 mg by mouth every evening.     . feeding supplement (BOOST / RESOURCE BREEZE) LIQD Take 1 Container by mouth 3 (three) times daily between meals.  0  . ferrous sulfate 325 (65 FE) MG EC tablet Take 325 mg by mouth 2 (two) times daily.    . fluticasone (FLONASE) 50 MCG/ACT nasal spray Place 2 sprays into the nose daily.      Marland Kitchen gabapentin (NEURONTIN) 100 MG capsule Take 100 mg by mouth at bedtime.    Marland Kitchen ketoconazole (NIZORAL)  2 % cream Apply 2 application topically as needed. rash  3  . latanoprost (XALATAN) 0.005 % ophthalmic solution Place 1 drop into both eyes every evening.     Marland Kitchen levothyroxine (SYNTHROID, LEVOTHROID) 100 MCG tablet Take 50-100 mcg by mouth daily before breakfast. Takes 20mg on sun and mon Takes 1077m all other days    . metFORMIN (GLUCOPHAGE) 500 MG tablet Take 500 mg by mouth daily.     . Marland KitchenoxyCODONE-acetaminophen (PERCOCET) 5-325 MG tablet Take one tablet by mouth every 4 hours as needed for moderate pain. Do not exceed 4gm of Tylenol in 24 hours 10 tablet 0  . OXYGEN Inhale 2.5 L into the lungs at bedtime. Only use oxygen at night    . pantoprazole (PROTONIX) 40 MG tablet Take 1 tablet (40 mg total) by mouth 2 (two) times daily.    . polyethylene glycol (MIRALAX / GLYCOLAX) packet Take 17 g by mouth daily.      . Marland Kitchenenna-docusate (SENOKOT-S) 8.6-50 MG tablet Take 2 tablets by mouth 2 (two) times daily.     No current facility-administered medications for this visit.     Allergies:  Allergies  Allergen Reactions  . Amoxicillin Palpitations  . Motrin [Ibuprofen] Palpitations  . Chantix [Varenicline Tartrate] Other (See Comments)    depression    Past Medical History, Surgical history, Social history, and Family History were reviewed and updated.   Physical Exam: Blood pressure 123/66, pulse 90, temperature 98 F (36.7 C), temperature source Oral, resp. rate 18, height 5' 7"  (1.702 m), weight 203 lb (92.08 kg), SpO2 92 %. ECOG: 2 General appearance: alert and cooperative chronically ill-appearing gentleman without distress. Head: Normocephalic, without obvious abnormality no oral ulcers or lesions. Neck: no adenopathy Lymph nodes: Cervical, supraclavicular, and axillary nodes normal. Heart: Irregular rate controlled. Lung:chest clear, no wheezing, rales, normal symmetric air entry Abdomin: soft, non-tender, without masses or organomegaly shifting dullness or ascites. EXT:no erythema, induration, or nodules   Lab Results: Lab Results  Component Value Date   WBC 15.4* 02/23/2015   HGB 8.9* 02/23/2015   HCT 28.4* 02/23/2015   MCV 93.1 02/23/2015   PLT 177 02/23/2015     Chemistry      Component Value Date/Time   NA 138 02/23/2015 0950   NA 142 10/18/2014   NA 140 05/24/2014 1425   NA 144 04/12/2011 1634   K 3.6 02/23/2015 0950   K 5.0 05/24/2014 1425   K  4.1 04/12/2011 1634   CL 99* 02/23/2015 0950   CL 104 04/12/2011 1634   CO2 29 02/23/2015 0950   CO2 27 05/24/2014 1425   CO2 31 04/12/2011 1634   BUN 11 02/23/2015 0950   BUN 21 10/18/2014   BUN 17.6 05/24/2014 1425   BUN 16 04/12/2011 1634   CREATININE 0.72 02/23/2015 0950   CREATININE 0.9 10/18/2014   CREATININE 0.9 05/24/2014 1425   CREATININE 0.97 04/12/2011 1634   GLU 105 10/18/2014      Component Value Date/Time   CALCIUM 8.3* 02/23/2015 0950   CALCIUM 9.5 05/24/2014 1425   CALCIUM 8.9 04/12/2011 1634   ALKPHOS 71 02/19/2015 0525   ALKPHOS 70 05/24/2014 1425   ALKPHOS 84 04/12/2011 1634   AST 24 02/19/2015 0525   AST 12 05/24/2014 1425   AST 23 04/12/2011 1634   ALT 20 02/19/2015 0525   ALT 8 05/24/2014 1425   ALT 18 04/12/2011 1634   BILITOT 0.5 02/19/2015 0525   BILITOT 0.41 05/24/2014 1425  BILITOT 0.4 04/12/2011 1634         Impression and Plan:   82 year old gentleman with the following issues:  1. Metastatic adenocarcinoma with disease predominantly to the bone. The pathology from 02/20/2015 indicating a potential lung primary. This diagnosis was discussed with the patient and his family today and the ramifications of these findings were reviewed. It is clear that we are dealing with an incurable malignancy this of the primary. It is very possible that his left lung pneumonia has malignancy associated with it and responsible for the primary.  Treatment options were reviewed today and I feel that given his quite debilitated state at this time he is not act great candidate for palliative chemotherapy. It is possible to consider EGFR targeted therapy if he harbors then mutation. His [facility] testing is ongoing and could potentially qualify for oral targeted therapy.  Alternatively, if he declines any aggressive therapy hospice would be my recommendation. He understands that regardless of any treatment approach eventually he'll require hospice  involvement in his advanced malignancy, poor performance status and aggressive nature of his cancer.  The plan at this point is for him to discuss these findings with his family and will let me know in the future if he is interested in hospice enrollment. In the meantime, I have scheduled him a quick follow-up in 2 weeks to follow-up on his status and potentially give me more time to get the results of his next generation sequencing studies.  2. Bone pain: Seems to be controlled at this time but might require narcotic analgesics in the future. Radiation therapy was also discussed as a potential option.   3. Anemia: Likely from a bleeding ulcer as well as malignancy. His hemoglobin upon discharge was 8.9 on 02/23/2015. We'll repeat a CBC with the next visit and transfuse as needed.  4. Prognosis: This was also discussed with the patient and his family. I believe he is a poor prognosis with limited life expectancy. I have recommended no aggressive measures upon further hospitalization in the future. I believe his life expectancy will likely be 6 months or less.  [facility], MD 1/23/20179:30 AM

## 2015-02-27 NOTE — Progress Notes (Signed)
Patient ID: Taylor [name], male   DOB: 01-12-1929, 82 y.o.   MRN: [id number]    Facility: [facility]    PCP [name], MD    Allergies  Allergen Reactions  . Amoxicillin Palpitations  . Motrin [Ibuprofen] Palpitations  . Chantix [Varenicline Tartrate] Other (See Comments)    depression    Chief Complaint  Patient presents with  . Hospitalization Follow-up    HPI:  He has been hospitalized for sepsis due to pneumonia and hypovolemic shock; syncope due to orthostatic hypotension; GI bleed; and underlying malignancy.  He was seen by Dr. [name] for follow up from his bone marrow biopsy. He does have metastatic adenocarcinoma he will need a palliative care consult. He is here for short term rehab with his goal to return back home. He is not voicing any complaints today.   Past Medical History  Diagnosis Date  . Hypertension   . Hyperlipidemia   . Hypothyroidism   . COPD (chronic obstructive pulmonary disease) ([place])   . Atrial fibrillation ([place])   . Glaucoma   . Anxiety   . History of bladder cancer   . Lung nodule     SMALL  . OSA (obstructive sleep apnea)   . Heart murmur   . Dysrhythmia     Hx of Atrial Flutter  . Deep vein blood clot of left lower extremity ([place])   . On home oxygen therapy     At night, during the day PRN 2.5L  . Cancer ([place])     bladder cancer, skin cancer  . Diabetes mellitus without complication ([facility])     Type 2  . Depression   . Arthritis   . Osteoporosis   . Pneumonia 02/2015  . Kidney stones 2016    Past Surgical History  Procedure Laterality Date  . Cardiovascular stress test  12/25/2007    EF 64%. NO EVIDENCE OF ISCHEMIA  . Cystoscopy N/A 12/15  . Varicose vein surgery      at 82 years old first, and about 20 years ago  . Back surgery  10-18-11    [facility]  . Tonsillectomy    . Eye surgery Bilateral     cataracts  . Multiple tooth extractions    . Total knee arthroplasty Left 10/11/2014    Procedure:  TOTAL KNEE ARTHROPLASTY;  Surgeon: Renette Butters, MD;  Location: [facility];  Service: Orthopedics;  Laterality: Left;  [facility] surgery      VITAL SIGNS BP 115/56 mmHg  Pulse 84  Temp(Src) 97.2 F (36.2 C)  Resp 18  Ht 6' (1.829 m)  Wt 202 lb (91.627 kg)  BMI 27.39 kg/m2  SpO2 92%  Patient's Medications  New Prescriptions   No medications on file  Previous Medications   ALBUTEROL (PROVENTIL HFA;VENTOLIN HFA) 108 (90 BASE) MCG/ACT INHALER    Inhale 2 puffs into the lungs every 4 (four) hours as needed for wheezing or shortness of breath.   ALBUTEROL (PROVENTIL) (2.5 MG/3ML) 0.083% NEBULIZER SOLUTION    Take 2.5 mg by nebulization every 6 (six) hours as needed for shortness of breath.    ALENDRONATE (FOSAMAX) 70 MG TABLET    Take 70 mg by mouth once a week. Monday   ALPRAZOLAM (XANAX) 0.25 MG TABLET    Take one tablet by mouth every night at bedtime as needed for anxiety   ATORVASTATIN (LIPITOR) 10 MG TABLET    Take 10 mg by mouth daily.   BUDESONIDE (PULMICORT) 0.25  MG/2ML NEBULIZER SOLUTION    Take 2 mLs (0.25 mg total) by nebulization 2 (two) times daily.   CALCIUM CARBONATE-VITAMIN D (CALCIUM + D PO)    Take 2 tablets by mouth daily.   DILTIAZEM (CARDIZEM CD) 120 MG 24 HR CAPSULE    Take 1 capsule (120 mg total) by mouth daily.   DULOXETINE (CYMBALTA) 30 MG CAPSULE    Take 30 mg by mouth every evening.    FEEDING SUPPLEMENT (BOOST / RESOURCE BREEZE) LIQD    Take 1 Container by mouth 3 (three) times daily between meals.   FERROUS SULFATE 325 (65 FE) MG EC TABLET    Take 325 mg by mouth 2 (two) times daily.   FLUTICASONE (FLONASE) 50 MCG/ACT NASAL SPRAY    Place 2 sprays into the nose daily.     GABAPENTIN (NEURONTIN) 100 MG CAPSULE    Take 100 mg by mouth at bedtime.   KETOCONAZOLE (NIZORAL) 2 % CREAM    Apply 2 application topically as needed. rash   LATANOPROST (XALATAN) 0.005 % OPHTHALMIC SOLUTION    Place 1 drop into both eyes every evening.    LEVOTHYROXINE (SYNTHROID,  LEVOTHROID) 100 MCG TABLET    Take 50-100 mcg by mouth daily before breakfast. Takes 83mg on sun and mon Takes 1041m all other days   METFORMIN (GLUCOPHAGE) 500 MG TABLET    Take 500 mg by mouth daily.    OXYCODONE-ACETAMINOPHEN (PERCOCET) 5-325 MG TABLET    Take one tablet by mouth every 4 hours as needed for moderate pain. Do not exceed 4gm of Tylenol in 24 hours   OXYGEN    Inhale 2.5 L into the lungs at bedtime. Only use oxygen at night   PANTOPRAZOLE (PROTONIX) 40 MG TABLET    Take 1 tablet (40 mg total) by mouth 2 (two) times daily.   POLYETHYLENE GLYCOL (MIRALAX / GLYCOLAX) PACKET    Take 17 g by mouth daily.     SENNA-DOCUSATE (SENOKOT-S) 8.6-50 MG TABLET    Take 2 tablets by mouth 2 (two) times daily.  Modified Medications   No medications on file  Discontinued Medications   No medications on file     SIGNIFICANT DIAGNOSTIC EXAMS  02-10-15: chest x-ray: Left lower lobe infiltrate most consistent with bronchopneumonia. Follow-up to clearing to rule out underlying mass.   02-14-15 chest x-ray: Slightly smaller and less dense appearing left lower lobe pneumonia.  02-14-15 ct of head: No acute findings. 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  02-15-15: 2-d echo: - Left ventricle: Wall thickness was increased in a pattern of moderate LVH. Systolic function was normal. The estimated ejection fraction was in the range of 60% to 65%. - Mitral valve: Calcified annulus. Mildly thickened leaflets . - Atrial septum: No defect or patent foramen ovale was identified. - Pulmonary arteries: PA peak pressure: 41 mm Hg (S).  02-16-15: ct of chest abdomen and pelvis: 1. Possible inflammation involving the distal stomach and proximal duodenum in this patient with tarry stools. This is suboptimally evaluated by this examination. Consider endoscopic correlation. 2. The remainder of the small bowel and colon demonstrate no significant findings. There is distal  colonic diverticulosis. 3. Left lower lobe pneumonia.  Small bilateral pleural effusions. 4. Multiple lytic and sclerotic lesions in the spine, ribs and pelvis, suspicious for metastatic disease or multiple myeloma. Correlate clinically. Probable pathologic fracture at T9. 5. Stable additional incidental findings including extensive atherosclerosis, cholelithiasis and a chronic right  adrenal mass.   02-17-15: mri of abdomen: 1. 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 2. Mild hepatic steatosis. 3. Gallstones 4. Aortic atherosclerosis 5. T12 bone lesions suspicious for metastatic disease. 6. T9 compression fracture.   02-20-15: bone biopsy: Successful CT-guided 11 gauge core biopsy of the left iliac sclerotic bone lesion.   LABS REVIEWED:   02-14-15: wbc 17.1; hgb 8.6; hct 28.6; mcv 90.8; plt 205; glucose 117; bun 43; creat 1.31; k+ 4.2; na++140; liver normal albumin 2.1; cortisol 14.5; mag 1.7; tsh 1.034; hgb a1c 7.1 02-17-15: wbc 21.3; hgb 8.0; hct 24.0; mcv 87.9; plt 169; glucose 192; bun 36; creat 0.77; k+ 4.7; na++140; liver normal albumin 1.7; PSA 0.02 02-19-15: wbc 17.1; hgb 9.2; hct 28.2; mcv 89.0; plt 152; glucose 189; bun 12; creat 0.58; k+ 3.5; na++137; liver normal albumin 1.7 02-23-15: wbc 154.; hgb 8.9; hct 28.4; mcv 93.1; plt 177; glucose 203; bun 11; creat 0.72; k+ 3.6; na++138    Review of Systems  Constitutional: Positive for malaise/fatigue.  Respiratory: Positive for cough and shortness of breath.   Cardiovascular: Negative for chest pain, palpitations and leg swelling.  Gastrointestinal: Negative for heartburn, abdominal pain and constipation.  Musculoskeletal: Negative for myalgias and joint pain.       Pain is managed   Skin: Negative.   Psychiatric/Behavioral: The patient is not nervous/anxious.     Physical Exam  Constitutional: No distress.  Overweight   Eyes: Conjunctivae are normal.  Neck: Neck supple. No JVD  present. No thyromegaly present.  Cardiovascular: Normal rate, regular rhythm and intact distal pulses.   Murmur heard. Respiratory: Effort normal. No respiratory distress. He has no wheezes.  Breath sounds diminished throughout 02 dependent   GI: Soft. Bowel sounds are normal. He exhibits no distension. There is no tenderness.  Musculoskeletal: He exhibits no edema.  Able to move all extremities   Lymphadenopathy:    He has no cervical adenopathy.  Neurological: He is alert.  Skin: Skin is warm and dry. He is not diaphoretic.  Psychiatric: He has a normal mood and affect.      ASSESSMENT/ PLAN:  1. COPD: is 02 dependent; will continue pulmicort neb treatment 0.25 mg twice daily flonase2 puffs per nostril  daily;  has albuterol neb treatment every 6 hours as needed and albuterol inhaler 2 puffs every 4 hours as needed; will monitor   2. Atrial flutter: heart rate is stable will continue Cardizem cd 120 mg daily for rate control and will monitor   3. Anemia: hgb is 8.9 will continue iron twice daily   4. Osteoporosis; will continue fosamax 70 mg weekly and calcium supplement   5. Dyslipidemia: will continue lipitor 10 mg daily   6. Diabetes: will continue metformin 500 mg daily   7. Hypothyroidism: will continue synthroid 100 mcg daily except 50 mcg on Sunday and Monday  8. Constipation: will continue senna s 2 tabs twice daily; miralax daily   9. GERD:  Will continue protonix 40 mg twice daily   10. Low back pain with radiating left leg pain: his pain is presently being managed with neurontin 100 mg nightly and has percocet 5/325 mg every 4 hours as needed   11. Hypertension: will continue cardizem cd 120 mg daily   12. Depression: cymbalta 30 mg daily and has xanax 0.25 mg nightly as needed for anxiety   13. Glaucoma will continue xalatan to both eyes nightly   14. Metastatic adenocarcinoma: is followed  by oncology; will setup a palliative care consult. His pain is  presently being managed will monitor his status.   15. Severe protein calorie malnutrition: will begin prostat 30 cc three times daily for his albumin of 1.7.   Will check cbc   Time spent with patient  50  minutes >50% time spent counseling; reviewing medical record; tests; labs; and developing future plan of care     Ok Edwards NP [facility]  Contact [phone] Monday through Friday 8am- 5pm  After hours call [phone]

## 2015-02-28 ENCOUNTER — Encounter: Payer: Self-pay | Admitting: Internal Medicine

## 2015-02-28 ENCOUNTER — Non-Acute Institutional Stay (SKILLED_NURSING_FACILITY): Payer: Medicare Other | Admitting: Internal Medicine

## 2015-02-28 DIAGNOSIS — I483 Typical atrial flutter: Secondary | ICD-10-CM | POA: Diagnosis not present

## 2015-02-28 DIAGNOSIS — E1100 Type 2 diabetes mellitus with hyperosmolarity without nonketotic hyperglycemic-hyperosmolar coma (NKHHC): Secondary | ICD-10-CM | POA: Diagnosis not present

## 2015-02-28 DIAGNOSIS — R531 Weakness: Secondary | ICD-10-CM | POA: Diagnosis not present

## 2015-02-28 DIAGNOSIS — F418 Other specified anxiety disorders: Secondary | ICD-10-CM

## 2015-02-28 DIAGNOSIS — J432 Centrilobular emphysema: Secondary | ICD-10-CM

## 2015-02-28 DIAGNOSIS — C799 Secondary malignant neoplasm of unspecified site: Secondary | ICD-10-CM

## 2015-02-28 DIAGNOSIS — I10 Essential (primary) hypertension: Secondary | ICD-10-CM | POA: Diagnosis not present

## 2015-02-28 DIAGNOSIS — E039 Hypothyroidism, unspecified: Secondary | ICD-10-CM | POA: Diagnosis not present

## 2015-02-28 NOTE — Progress Notes (Signed)
Patient ID: HUNTINGTON LEVERICH, male   DOB: [birth year], 82 y.o.   MRN: [id number]    HISTORY AND PHYSICAL   DATE: 02/28/15  Location:  [facility]    Place of Service: SNF [id number]   Extended Emergency Contact Information Primary Emergency Contact: [facility] Address: [address] [place], [place] [address] [place] Phone: [phone] Mobile Phone: [phone] Relation: Daughter Secondary Emergency Contact: Butler,Jennifer Address: [place], [place] Phone: [phone] Relation: Grandaughter  Advanced Directive information  DNR  Chief Complaint  Patient presents with  . New Admit To SNF    HPI:  82 yo male seen today as a new admission into SNF following hospital stay for sepsis, PAF/xeralto, DM, hypothyroidism, HTN, hyperlipidemia, COPD exacerbation/nocturnal O2 at 2.5L at home, LBP -->left leg, generalized weakness, lytic bone lesions on xray, acute blood loss anemia, fall at home. He was recently tx for CAP LLL with levaquin. Oncology Dr [name] followed pt. MRI lumbar spine prior to admission revealed metastatic lesions. He underwent IR guided bone bx, results pending at d/c. He had 2 episodes of black tarry stools but no EGD attempted due to pulmonary status. He compleetd 9 days IV abx for sepsis coverage. BC remained negCT chest showed LLL pneumonia. CT head neg. 2D echo showed nml EF. K+ 6.5 on admission but normalized with hydration. A1c 7%. He presnt s to SNF for short term rehab  He saw oncology on yesterday and dx with metastatic adenoCA, primary lung. Bony mets to ribs, spine and pelvis. Bone bx confirmed presence of adenoCA with tumor markers c/w lung primary. He has not decided what to do at this point. He is thinking about [employer]. Occasional bone pain. Pain is overall well controlled. He has difficulty standing and walking without assistance and feels  weak. No falls. Past Medical History  Diagnosis Date  . Hypertension   . Hyperlipidemia   . Hypothyroidism   . COPD (chronic obstructive pulmonary disease) ([place])   . Atrial fibrillation ([place])   . Glaucoma   . Anxiety   . History of bladder cancer   . Lung nodule     SMALL  . OSA (obstructive sleep apnea)   . Heart murmur   . Dysrhythmia     Hx of Atrial Flutter  . Deep vein blood clot of left lower extremity ([place])   . On home oxygen therapy     At night, during the day PRN 2.5L  . Cancer ([place])     bladder cancer, skin cancer  . Diabetes mellitus without complication (HCC)     Type 2  . Depression   . Arthritis   . Osteoporosis   . Pneumonia 02/2015  . Kidney stones 2016    Past Surgical History  Procedure Laterality Date  . Cardiovascular stress test  12/25/2007    EF 64%. NO EVIDENCE OF ISCHEMIA  . Cystoscopy N/A 12/15  . Varicose vein surgery      at 82 years old first, and about 20 years ago  . Back surgery  10-18-11    [facility]  . Tonsillectomy    . Eye surgery Bilateral     cataracts  . Multiple tooth extractions    . Total knee arthroplasty Left 10/11/2014    Procedure: TOTAL KNEE ARTHROPLASTY;  Surgeon: Renette Butters, MD;  Location: [place];  Service: Orthopedics;  Laterality: Left;  Marland Kitchen Vascular surgery      Patient Care Team: Reynold Bowen, MD as PCP - General (Endocrinology) Reynold Bowen, MD as Referring Physician (Endocrinology)  Social History   Social History  . Marital Status: Divorced    Spouse Name: N/A  . Number of Children: 1  . Years of Education: N/A   Occupational History  . Retired     worked at a [employer]   Social History Main Topics  . Smoking status: Former Smoker -- 1.50 packs/day for 60 years    Types: Cigarettes    Quit date: 11/27/2010  . Smokeless tobacco: Never Used  . Alcohol Use: No  . Drug Use: No  . Sexual Activity: No   Other Topics Concern  . Not on file   Social History Narrative       reports that he quit smoking about 4 years ago. His smoking use included Cigarettes. He has a 90 pack-year smoking history. He has never used smokeless tobacco. He reports that he does not drink alcohol or use illicit drugs.  Family History  Problem Relation Age of Onset  . Cancer - Lung Mother     63  . Heart attack Mother   . Heart Problems Father   . Diabetes Brother   . Alcoholism Brother   . Other Daughter     [place]  . Other Other     HALF SISTER UNK HEALTH   Family Status  Relation Status Death Age  . Mother Deceased 46  . Father Deceased 26  . Brother Deceased   . Brother Deceased   . Maternal Grandmother Deceased   . Maternal Grandfather Deceased   . Paternal Grandmother Deceased   . Paternal Grandfather Deceased   . Daughter Alive   . Sister Deceased     HALF SISTER    Immunization History  Administered Date(s) Administered  . Influenza Split 12/06/2012  . Influenza Whole 11/05/2010, 11/05/2011  . Influenza,inj,Quad PF,36+ Mos 11/15/2013  . Influenza-Unspecified 11/15/2014  . PPD Test 10/27/2014    Allergies  Allergen Reactions  . Amoxicillin Palpitations  . Motrin [Ibuprofen] Palpitations  . Chantix [Varenicline Tartrate] Other (See Comments)    depression    Medications: Patient's Medications  New Prescriptions   No medications on file  Previous Medications   ALBUTEROL (PROVENTIL HFA;VENTOLIN HFA) 108 (90 BASE) MCG/ACT INHALER    Inhale 2 puffs into the lungs every 4 (four) hours as needed for wheezing or shortness of breath.   ALBUTEROL (PROVENTIL) (2.5 MG/3ML) 0.083% NEBULIZER SOLUTION    Take 2.5 mg by nebulization every 6 (six) hours as needed for shortness of breath.    ALENDRONATE (FOSAMAX) 70 MG TABLET    Take 70 mg by mouth once a week. Monday   ALPRAZOLAM (XANAX) 0.25 MG TABLET    Take one tablet by mouth every night at bedtime as needed for anxiety   ATORVASTATIN (LIPITOR) 10 MG TABLET    Take 10 mg by mouth daily.    BUDESONIDE (PULMICORT) 0.25 MG/2ML NEBULIZER SOLUTION    Take 2 mLs (0.25 mg total) by nebulization 2 (two) times daily.   CALCIUM CARBONATE-VITAMIN D (CALCIUM + D PO)    Take 2 tablets by mouth daily.   DILTIAZEM (CARDIZEM CD) 120 MG 24 HR CAPSULE    Take 1 capsule (120 mg total) by mouth daily.   DULOXETINE (CYMBALTA) 30 MG CAPSULE    Take 30  mg by mouth every evening.    FEEDING SUPPLEMENT (BOOST / RESOURCE BREEZE) LIQD    Take 1 Container by mouth 3 (three) times daily between meals.   FERROUS SULFATE 325 (65 FE) MG EC TABLET    Take 325 mg by mouth 2 (two) times daily.   FLUTICASONE (FLONASE) 50 MCG/ACT NASAL SPRAY    Place 2 sprays into the nose daily.     GABAPENTIN (NEURONTIN) 100 MG CAPSULE    Take 100 mg by mouth at bedtime.   KETOCONAZOLE (NIZORAL) 2 % CREAM    Apply 2 application topically as needed. rash   LATANOPROST (XALATAN) 0.005 % OPHTHALMIC SOLUTION    Place 1 drop into both eyes every evening.    LEVOTHYROXINE (SYNTHROID, LEVOTHROID) 100 MCG TABLET    Take 50-100 mcg by mouth daily before breakfast. Takes 41mg on sun and mon Takes 1041m all other days   METFORMIN (GLUCOPHAGE) 500 MG TABLET    Take 500 mg by mouth daily.    OXYCODONE-ACETAMINOPHEN (PERCOCET) 5-325 MG TABLET    Take one tablet by mouth every 4 hours as needed for moderate pain. Do not exceed 4gm of Tylenol in 24 hours   OXYGEN    Inhale 2.5 L into the lungs at bedtime. Only use oxygen at night   PANTOPRAZOLE (PROTONIX) 40 MG TABLET    Take 1 tablet (40 mg total) by mouth 2 (two) times daily.   POLYETHYLENE GLYCOL (MIRALAX / GLYCOLAX) PACKET    Take 17 g by mouth daily.     SENNA-DOCUSATE (SENOKOT-S) 8.6-50 MG TABLET    Take 2 tablets by mouth 2 (two) times daily.  Modified Medications   No medications on file  Discontinued Medications   No medications on file    Review of Systems  Constitutional: Positive for activity change and fatigue.  Musculoskeletal: Positive for arthralgias and gait problem (unable  to ambulate without assistance).       Bone pain occasionally  Neurological: Positive for weakness.  All other systems reviewed and are negative.   Filed Vitals:   02/28/15 1142  BP: 115/56  Pulse: 84  Temp: 97.2 F (36.2 C)  Weight: 202 lb (91.627 kg)  SpO2: 92%   Body mass index is 27.39 kg/(m^2).  Physical Exam  Constitutional: He is oriented to person, place, and time. He appears well-developed and well-nourished.  Looks pale in NAD, lying in bed resting.  HENT:  Mouth/Throat: Oropharynx is clear and moist.  Eyes: Pupils are equal, round, and reactive to light. No scleral icterus.  Neck: Neck supple. Carotid bruit is not present. No thyromegaly present.  Cardiovascular: Normal rate, regular rhythm, normal heart sounds and intact distal pulses.  Exam reveals no gallop and no friction rub.   No murmur heard. no distal LE swelling. No calf TTP  Pulmonary/Chest: Effort normal. He has decreased breath sounds (left). He has no wheezes. He has no rhonchi. He has no rales. He exhibits no tenderness.  Abdominal: Soft. Bowel sounds are normal. He exhibits no distension, no abdominal bruit, no pulsatile midline mass and no mass. There is no tenderness. There is no rebound and no guarding.  Musculoskeletal: He exhibits edema (LLE edema with left knee swelling and reduced ROM).  Lymphadenopathy:    He has no cervical adenopathy.  Neurological: He is alert and oriented to person, place, and time.  Skin: Skin is warm and dry. No rash noted.  Psychiatric: He has a normal mood and affect. His behavior is normal.  Judgment and thought content normal.     Labs reviewed: Admission on 02/14/2015, Discharged on 02/24/2015  No results displayed because visit has over 200 results.  CBC Latest Ref Rng 02/23/2015 02/21/2015 02/20/2015  WBC 4.0 - 10.5 K/uL 15.4(H) 18.1(H) 16.9(H)  Hemoglobin 13.0 - 17.0 g/dL 8.9(L) 9.3(L) 8.8(L)  Hematocrit 39.0 - 52.0 % 28.4(L) 28.9(L) 27.7(L)  Platelets 150 - 400  K/uL 177 149(L) 152    CMP Latest Ref Rng 02/23/2015 02/22/2015 02/19/2015  Glucose 65 - 99 mg/dL 203(H) 180(H) 189(H)  BUN 6 - 20 mg/dL 11 10 12   Creatinine 0.61 - 1.24 mg/dL 0.72 0.69 0.58(L)  Sodium 135 - 145 mmol/L 138 136 137  Potassium 3.5 - 5.1 mmol/L 3.6 3.4(L) 3.5  Chloride 101 - 111 mmol/L 99(L) 104 105  CO2 22 - 32 mmol/L 29 29 23   Calcium 8.9 - 10.3 mg/dL 8.3(L) 7.8(L) 7.5(L)  Total Protein 6.5 - 8.1 g/dL - - 4.8(L)  Total Bilirubin 0.3 - 1.2 mg/dL - - 0.5  Alkaline Phos 38 - 126 U/L - - 71  AST 15 - 41 U/L - - 24  ALT 17 - 63 U/L - - 20    Lab Results  Component Value Date   HGBA1C 7.1* 02/14/2015   Lipid Panel  No results found for: CHOL, TRIG, HDL, CHOLHDL, VLDL, LDLCALC, LDLDIRECT    Admission on 02/10/2015, Discharged on 02/12/2015  Component Date Value Ref Range Status  . WBC 02/10/2015 17.1* 4.0 - 10.5 K/uL Final  . RBC 02/10/2015 3.90* 4.22 - 5.81 MIL/uL Final  . Hemoglobin 02/10/2015 10.8* 13.0 - 17.0 g/dL Final  . HCT 02/10/2015 36.6* 39.0 - 52.0 % Final  . MCV 02/10/2015 93.8  78.0 - 100.0 fL Final  . MCH 02/10/2015 27.7  26.0 - 34.0 pg Final  . MCHC 02/10/2015 29.5* 30.0 - 36.0 g/dL Final  . RDW 02/10/2015 14.8  11.5 - 15.5 % Final  . Platelets 02/10/2015 265  150 - 400 K/uL Final  . Neutrophils Relative % 02/10/2015 81   Final  . Neutro Abs 02/10/2015 13.9* 1.7 - 7.7 K/uL Final  . Lymphocytes Relative 02/10/2015 8   Final  . Lymphs Abs 02/10/2015 1.4  0.7 - 4.0 K/uL Final  . Monocytes Relative 02/10/2015 10   Final  . Monocytes Absolute 02/10/2015 1.7* 0.1 - 1.0 K/uL Final  . Eosinophils Relative 02/10/2015 1   Final  . Eosinophils Absolute 02/10/2015 0.2  0.0 - 0.7 K/uL Final  . Basophils Relative 02/10/2015 0   Final  . Basophils Absolute 02/10/2015 0.0  0.0 - 0.1 K/uL Final  . Sodium 02/10/2015 138  135 - 145 mmol/L Final  . Potassium 02/10/2015 6.5* 3.5 - 5.1 mmol/L Final   Comment: SLIGHT HEMOLYSIS CRITICAL RESULT CALLED TO, READ BACK BY  AND VERIFIED WITH: C HUGHES,RN 1612 02/10/15 D BRADLEY   . Chloride 02/10/2015 100* 101 - 111 mmol/L Final  . CO2 02/10/2015 30  22 - 32 mmol/L Final  . Glucose, Bld 02/10/2015 119* 65 - 99 mg/dL Final  . BUN 02/10/2015 24* 6 - 20 mg/dL Final  . Creatinine, Ser 02/10/2015 0.84  0.61 - 1.24 mg/dL Final  . Calcium 02/10/2015 9.1  8.9 - 10.3 mg/dL Final  . GFR calc non Af Amer 02/10/2015 >60  >60 mL/min Final  . GFR calc Af Amer 02/10/2015 >60  >60 mL/min Final   Comment: (NOTE) The eGFR has been calculated using the CKD EPI equation. This calculation has not been validated  in all clinical situations. eGFR's persistently <60 mL/min signify possible Chronic Kidney Disease.   . Anion gap 02/10/2015 8  5 - 15 Final  . Troponin i, poc 02/10/2015 0.00  0.00 - 0.08 ng/mL Final  . Comment 3 02/10/2015          Final   Comment: Due to the release kinetics of cTnI, a negative result within the first hours of the onset of symptoms does not rule out myocardial infarction with certainty. If myocardial infarction is still suspected, repeat the test at appropriate intervals.   . Potassium 02/10/2015 5.8* 3.5 - 5.1 mmol/L Final  . HIV Screen 4th Generation wRfx 02/10/2015 Non Reactive  Non Reactive Final   Comment: (NOTE) Performed At: [facility] [place], [place] [id number] Lindon Romp MD QP:[id number]   . Specimen Description 02/10/2015 BLOOD RIGHT ARM   Final  . Special Requests 02/10/2015 IN PEDIATRIC BOTTLE 1CC   Final  . Culture 02/10/2015 NO GROWTH 5 DAYS   Final  . Report Status 02/10/2015 02/15/2015 FINAL   Final  . Specimen Description 02/10/2015 BLOOD LEFT ANTECUBITAL   Final  . Special Requests 02/10/2015 BOTTLES DRAWN AEROBIC AND ANAEROBIC 5CC   Final  . Culture 02/10/2015 NO GROWTH 5 DAYS   Final  . Report Status 02/10/2015 02/15/2015 FINAL   Final  . Strep Pneumo Urinary Antigen 02/11/2015 NEGATIVE  NEGATIVE Final   Comment:        Infection due to  S. pneumoniae cannot be absolutely ruled out since the antigen present may be below the detection limit of the test.   . B Natriuretic Peptide 02/10/2015 47.8  0.0 - 100.0 pg/mL Final  . TSH 02/10/2015 0.290* 0.350 - 4.500 uIU/mL Final  . Specimen Description 02/11/2015 URINE, RANDOM   Final  . Special Requests 02/11/2015 NONE   Final  . Legionella Antigen, Urine 02/11/2015    Final                   Value:Negative for Legionella pneumophila serogroup 1                                                              Legionella pneumophila serogroup 1 antigen can be detected in urine within 2 to 3 days of infection and may persist even after treatment. This  assay does not detect other Legionella species or serogroups. Performed at [facility]   . Report Status 02/11/2015 02/13/2015 FINAL   Final  . Hgb A1c MFr Bld 02/10/2015 7.0* 4.8 - 5.6 % Final   Comment: (NOTE)         Pre-diabetes: 5.7 - 6.4         Diabetes: >6.4         Glycemic control for adults with diabetes: <7.0   . Mean Plasma Glucose 02/10/2015 154   Final   Comment: (NOTE) Performed At: [facility] [place], [place] [id number] Lindon Romp MD LT:[id number]   . Sodium 02/10/2015 137  135 - 145 mmol/L Final  . Potassium 02/10/2015 5.0  3.5 - 5.1 mmol/L Final  . Chloride 02/10/2015 98* 101 - 111 mmol/L Final  . CO2 02/10/2015 27  22 - 32 mmol/L Final  . Glucose, Bld 02/10/2015 188* 65 -  99 mg/dL Final  . BUN 02/10/2015 20  6 - 20 mg/dL Final  . Creatinine, Ser 02/10/2015 0.79  0.61 - 1.24 mg/dL Final  . Calcium 02/10/2015 9.1  8.9 - 10.3 mg/dL Final  . GFR calc non Af Amer 02/10/2015 >60  >60 mL/min Final  . GFR calc Af Amer 02/10/2015 >60  >60 mL/min Final   Comment: (NOTE) The eGFR has been calculated using the CKD EPI equation. This calculation has not been validated in all clinical situations. eGFR's persistently <60 mL/min signify possible Chronic Kidney Disease.   . Anion  gap 02/10/2015 12  5 - 15 Final  . Sodium 02/10/2015 138  135 - 145 mmol/L Final  . Potassium 02/10/2015 6.0* 3.5 - 5.1 mmol/L Final  . Chloride 02/10/2015 99* 101 - 111 mmol/L Final  . CO2 02/10/2015 31  22 - 32 mmol/L Final  . Glucose, Bld 02/10/2015 169* 65 - 99 mg/dL Final  . BUN 02/10/2015 21* 6 - 20 mg/dL Final  . Creatinine, Ser 02/10/2015 0.74  0.61 - 1.24 mg/dL Final  . Calcium 02/10/2015 9.0  8.9 - 10.3 mg/dL Final  . GFR calc non Af Amer 02/10/2015 >60  >60 mL/min Final  . GFR calc Af Amer 02/10/2015 >60  >60 mL/min Final   Comment: (NOTE) The eGFR has been calculated using the CKD EPI equation. This calculation has not been validated in all clinical situations. eGFR's persistently <60 mL/min signify possible Chronic Kidney Disease.   . Anion gap 02/10/2015 8  5 - 15 Final  . Sodium 02/11/2015 140  135 - 145 mmol/L Final  . Potassium 02/11/2015 6.3* 3.5 - 5.1 mmol/L Final   Comment: NO VISIBLE HEMOLYSIS CRITICAL RESULT CALLED TO, READ BACK BY AND VERIFIED WITH: HANEY R,RN 02/11/15 0404 WAYK   . Chloride 02/11/2015 104  101 - 111 mmol/L Final  . CO2 02/11/2015 28  22 - 32 mmol/L Final  . Glucose, Bld 02/11/2015 116* 65 - 99 mg/dL Final  . BUN 02/11/2015 19  6 - 20 mg/dL Final  . Creatinine, Ser 02/11/2015 0.74  0.61 - 1.24 mg/dL Final  . Calcium 02/11/2015 8.9  8.9 - 10.3 mg/dL Final  . GFR calc non Af Amer 02/11/2015 >60  >60 mL/min Final  . GFR calc Af Amer 02/11/2015 >60  >60 mL/min Final   Comment: (NOTE) The eGFR has been calculated using the CKD EPI equation. This calculation has not been validated in all clinical situations. eGFR's persistently <60 mL/min signify possible Chronic Kidney Disease.   . Anion gap 02/11/2015 8  5 - 15 Final  . Lactic Acid, Venous 02/10/2015 1.7  0.5 - 2.0 mmol/L Final  . Lactic Acid, Venous 02/10/2015 1.0  0.5 - 2.0 mmol/L Final  . Procalcitonin 02/10/2015 <0.10   Final   Comment:        Interpretation: PCT (Procalcitonin) <=  0.5 ng/mL: Systemic infection (sepsis) is not likely. Local bacterial infection is possible. (NOTE)         ICU PCT Algorithm               Non ICU PCT Algorithm    ----------------------------     ------------------------------         PCT < 0.25 ng/mL                 PCT < 0.1 ng/mL     Stopping of antibiotics            Stopping of antibiotics  strongly encouraged.               strongly encouraged.    ----------------------------     ------------------------------       PCT level decrease by               PCT < 0.25 ng/mL       >= 80% from peak PCT       OR PCT 0.25 - 0.5 ng/mL          Stopping of antibiotics                                             encouraged.     Stopping of antibiotics           encouraged.    ----------------------------     ------------------------------       PCT level decrease by              PCT >= 0.25 ng/mL       < 80% from peak PCT        AND PCT >= 0.5 ng/mL            Continuin                          g antibiotics                                              encouraged.       Continuing antibiotics            encouraged.    ----------------------------     ------------------------------     PCT level increase compared          PCT > 0.5 ng/mL         with peak PCT AND          PCT >= 0.5 ng/mL             Escalation of antibiotics                                          strongly encouraged.      Escalation of antibiotics        strongly encouraged.   . Fecal Occult Bld 02/11/2015 NEGATIVE  NEGATIVE Final  . Sodium, Ur 02/11/2015 84   Final  . Potassium Urine Timed 02/11/2015 17   Final  . Glucose-Capillary 02/10/2015 188* 65 - 99 mg/dL Final  . WBC 02/11/2015 16.1* 4.0 - 10.5 K/uL Final  . RBC 02/11/2015 3.82* 4.22 - 5.81 MIL/uL Final  . Hemoglobin 02/11/2015 10.5* 13.0 - 17.0 g/dL Final  . HCT 02/11/2015 35.7* 39.0 - 52.0 % Final  . MCV 02/11/2015 93.5  78.0 - 100.0 fL Final  . MCH 02/11/2015 27.5  26.0 - 34.0 pg Final  . MCHC  02/11/2015 29.4* 30.0 - 36.0 g/dL Final  . RDW 02/11/2015 14.7  11.5 - 15.5 % Final  . Platelets 02/11/2015 232  150 - 400 K/uL Final  . Neutrophils Relative % 02/11/2015 82   Final  . Neutro Abs 02/11/2015 13.2* 1.7 -  7.7 K/uL Final  . Lymphocytes Relative 02/11/2015 7   Final  . Lymphs Abs 02/11/2015 1.2  0.7 - 4.0 K/uL Final  . Monocytes Relative 02/11/2015 10   Final  . Monocytes Absolute 02/11/2015 1.6* 0.1 - 1.0 K/uL Final  . Eosinophils Relative 02/11/2015 1   Final  . Eosinophils Absolute 02/11/2015 0.2  0.0 - 0.7 K/uL Final  . Basophils Relative 02/11/2015 0   Final  . Basophils Absolute 02/11/2015 0.0  0.0 - 0.1 K/uL Final  . Influenza A By PCR 02/11/2015 NEGATIVE  NEGATIVE Final  . Influenza B By PCR 02/11/2015 NEGATIVE  NEGATIVE Final  . H1N1 flu by pcr 02/11/2015 NOT DETECTED  NOT DETECTED Final   Comment:        The Xpert Flu assay (FDA approved for nasal aspirates or washes and nasopharyngeal swab specimens), is intended as an aid in the diagnosis of influenza and should not be used as a sole basis for treatment.   Marland Kitchen MRSA by PCR 02/11/2015 POSITIVE* NEGATIVE Final   Comment:        The GeneXpert MRSA Assay (FDA approved for NASAL specimens only), is one component of a comprehensive MRSA colonization surveillance program. It is not intended to diagnose MRSA infection nor to guide or monitor treatment for MRSA infections. RESULT CALLED TO, READ BACK BY AND VERIFIED WITH: R HANEY @0649  02/05/15 MKELLY   . Sodium 02/11/2015 139  135 - 145 mmol/L Final  . Potassium 02/11/2015 5.0  3.5 - 5.1 mmol/L Final   DELTA CHECK NOTED  . Chloride 02/11/2015 101  101 - 111 mmol/L Final  . CO2 02/11/2015 27  22 - 32 mmol/L Final  . Glucose, Bld 02/11/2015 132* 65 - 99 mg/dL Final  . BUN 02/11/2015 16  6 - 20 mg/dL Final  . Creatinine, Ser 02/11/2015 0.76  0.61 - 1.24 mg/dL Final  . Calcium 02/11/2015 9.2  8.9 - 10.3 mg/dL Final  . GFR calc non Af Amer 02/11/2015 >60  >60  mL/min Final  . GFR calc Af Amer 02/11/2015 >60  >60 mL/min Final   Comment: (NOTE) The eGFR has been calculated using the CKD EPI equation. This calculation has not been validated in all clinical situations. eGFR's persistently <60 mL/min signify possible Chronic Kidney Disease.   . Anion gap 02/11/2015 11  5 - 15 Final  . Glucose-Capillary 02/11/2015 225* 65 - 99 mg/dL Final  . Glucose-Capillary 02/11/2015 111* 65 - 99 mg/dL Final  . Glucose-Capillary 02/11/2015 176* 65 - 99 mg/dL Final  . Procalcitonin 02/12/2015 <0.10   Final   Comment:        Interpretation: PCT (Procalcitonin) <= 0.5 ng/mL: Systemic infection (sepsis) is not likely. Local bacterial infection is possible. (NOTE)         ICU PCT Algorithm               Non ICU PCT Algorithm    ----------------------------     ------------------------------         PCT < 0.25 ng/mL                 PCT < 0.1 ng/mL     Stopping of antibiotics            Stopping of antibiotics       strongly encouraged.               strongly encouraged.    ----------------------------     ------------------------------  PCT level decrease by               PCT < 0.25 ng/mL       >= 80% from peak PCT       OR PCT 0.25 - 0.5 ng/mL          Stopping of antibiotics                                             encouraged.     Stopping of antibiotics           encouraged.    ----------------------------     ------------------------------       PCT level decrease by              PCT >= 0.25 ng/mL       < 80% from peak PCT        AND PCT >= 0.5 ng/mL            Continuin                          g antibiotics                                              encouraged.       Continuing antibiotics            encouraged.    ----------------------------     ------------------------------     PCT level increase compared          PCT > 0.5 ng/mL         with peak PCT AND          PCT >= 0.5 ng/mL             Escalation of antibiotics                                           strongly encouraged.      Escalation of antibiotics        strongly encouraged.   . Glucose-Capillary 02/11/2015 156* 65 - 99 mg/dL Final  . Sodium 02/12/2015 136  135 - 145 mmol/L Final  . Potassium 02/12/2015 4.9  3.5 - 5.1 mmol/L Final  . Chloride 02/12/2015 98* 101 - 111 mmol/L Final  . CO2 02/12/2015 30  22 - 32 mmol/L Final  . Glucose, Bld 02/12/2015 140* 65 - 99 mg/dL Final  . BUN 02/12/2015 20  6 - 20 mg/dL Final  . Creatinine, Ser 02/12/2015 0.71  0.61 - 1.24 mg/dL Final  . Calcium 02/12/2015 9.0  8.9 - 10.3 mg/dL Final  . GFR calc non Af Amer 02/12/2015 >60  >60 mL/min Final  . GFR calc Af Amer 02/12/2015 >60  >60 mL/min Final   Comment: (NOTE) The eGFR has been calculated using the CKD EPI equation. This calculation has not been validated in all clinical situations. eGFR's persistently <60 mL/min signify possible Chronic Kidney Disease.   . Anion gap 02/12/2015 8  5 - 15 Final  . WBC 02/12/2015 15.7* 4.0 - 10.5 K/uL Final  . RBC 02/12/2015 3.75* 4.22 -  5.81 MIL/uL Final  . Hemoglobin 02/12/2015 10.4* 13.0 - 17.0 g/dL Final  . HCT 02/12/2015 34.8* 39.0 - 52.0 % Final  . MCV 02/12/2015 92.8  78.0 - 100.0 fL Final  . MCH 02/12/2015 27.7  26.0 - 34.0 pg Final  . MCHC 02/12/2015 29.9* 30.0 - 36.0 g/dL Final  . RDW 02/12/2015 14.6  11.5 - 15.5 % Final  . Platelets 02/12/2015 229  150 - 400 K/uL Final  . Glucose-Capillary 02/12/2015 135* 65 - 99 mg/dL Final  . Glucose-Capillary 02/12/2015 127* 65 - 99 mg/dL Final  . Comment 1 02/12/2015 Notify RN   Final  . Comment 2 02/12/2015 Document in Chart   Final  . Glucose-Capillary 02/12/2015 128* 65 - 99 mg/dL Final  . Comment 1 02/12/2015 Notify RN   Final  . Comment 2 02/12/2015 Document in Chart   Final    Ct Abdomen Pelvis Wo Contrast  02/16/2015  CLINICAL DATA:  Anemia, leukocytosis and tarry stools. Recent admission for sepsis. History of COPD, diabetes and hypertension. History of bladder cancer and  plasma cell disorder. EXAM: CT CHEST, ABDOMEN AND PELVIS WITHOUT CONTRAST TECHNIQUE: Multidetector CT imaging of the chest, abdomen and pelvis was performed following the standard protocol without IV contrast. COMPARISON:  Chest CT 12/15/2007, abdominal pelvic CT 06/30/2014 and chest radiographs 02/10/2015. Skeletal survey 05/24/2014. FINDINGS: CT CHEST Mediastinum/Nodes: There are no enlarged mediastinal, hilar or axillary lymph nodes. Small mediastinal lymph nodes are stable. The thyroid gland, trachea and esophagus demonstrate no significant findings. The heart size is normal. There is no pericardial effusion. There is extensive atherosclerosis of the aorta, great vessels and coronary arteries. Lungs/Pleura: There is minimal dependent pleural fluid bilaterally. As demonstrated on recent radiographs, there is airspace disease with consolidation and air bronchograms in the left lower lobe. There is minimal dependent atelectasis at the right lung base. A 6 mm left upper lobe nodule on image number 16 is stable. There are no new or enlarging pulmonary nodules. Mild emphysematous changes are present. Musculoskeletal/Chest wall: There is a T9 compression fracture which appears new and likely pathologic. There are multiple irregular lucent and sclerotic lesions throughout the thoracic spine and ribs. Small purely lytic lesion noted anteriorly in the left third rib (image 21). CT ABDOMEN AND PELVIS FINDINGS Hepatobiliary: As evaluated in the noncontrast state, the liver appears stable without suspicious findings. Small calcified gallstones are again noted. There is no gallbladder wall thickening or significant biliary dilatation. Pancreas: Unremarkable. No pancreatic ductal dilatation or surrounding inflammatory changes. Spleen: Normal in size without focal abnormality. Adrenals/Urinary Tract: Chronic right adrenal mass appears unchanged, measuring 3.4 x 3.1 cm on image 51. This contains calcifications, soft tissue and  fatty components. 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 The left adrenal gland appears normal. Multiple bilateral renal cysts are again noted, measuring up to 6.0 cm in the lower pole of the right kidney and up to 5.8 cm in the interpolar region of the left kidney. Both kidneys demonstrate mild cortical thinning. There is no evidence of urinary tract calculus or hydronephrosis. The bladder appears normal. Stomach/Bowel: There appears to be mild soft tissue stranding around the distal stomach, proximal duodenum and pancreatic head. This is suboptimally evaluated due to the lack of intravenous and oral contrast. There is no extraluminal fluid collection. No evidence of bowel wall thickening, distention or surrounding inflammatory change. Stable diverticular changes of the sigmoid colon. Vascular/Lymphatic: There are no enlarged abdominal or pelvic lymph nodes.  Stable extensive atherosclerosis of the aorta, its branches and the iliac arteries. There is stable dilatation of the right common iliac artery. Reproductive: Unremarkable. Other: Stable asymmetric fat in the left inguinal canal and stable small umbilical hernia containing only fat. No evidence of ascites. Musculoskeletal: There are several new mixed lytic and sclerotic lesions within the lumbar spine. There is a dominant 2.2 cm sclerotic lesion in the S1 segment. Postsurgical changes are noted in the lower lumbar spine. There is no evidence of lumbar pathologic fracture or epidural tumor. IMPRESSION: 1. Possible inflammation involving the distal stomach and proximal duodenum in this patient with tarry stools. This is suboptimally evaluated by this examination. Consider endoscopic correlation. 2. The remainder of the small bowel and colon demonstrate no significant findings. There is distal colonic diverticulosis. 3. Left lower lobe pneumonia.  Small bilateral pleural effusions. 4. Multiple lytic and sclerotic lesions  in the spine, ribs and pelvis, suspicious for metastatic disease or multiple myeloma. Correlate clinically. Probable pathologic fracture at T9. 5. Stable additional incidental findings including extensive atherosclerosis, cholelithiasis and a chronic right adrenal mass. Electronically Signed   By: Richardean Sale M.D.   On: 02/16/2015 09:45   Dg Chest 2 View  02/10/2015  CLINICAL DATA:  Left-sided chest pain in the mornings for the last few days. EXAM: CHEST  2 VIEW COMPARISON:  04/12/2011 FINDINGS: Heart size is normal. There is atherosclerosis of the aorta. The right lung is clear. There is abnormal density in the left lower lobe posteriorly most consistent with bronchopneumonia. Follow-up to clearing recommended to ensure there is not a mass lesion. No effusion. No acute bone finding. IMPRESSION: Left lower lobe infiltrate most consistent with bronchopneumonia. Follow-up to clearing to rule out underlying mass. Electronically Signed   By: Nelson Chimes M.D.   On: 02/10/2015 15:12   Ct Head Wo Contrast  02/14/2015  CLINICAL DATA:  Syncope and headache. EXAM: CT HEAD WITHOUT CONTRAST TECHNIQUE: Contiguous axial images were obtained from the base of the skull through the vertex without intravenous contrast. 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 Stable cortical atrophy. The brain demonstrates no evidence of hemorrhage, acute infarction, edema, mass effect, extra-axial fluid collection, hydrocephalus or mass lesion. The skull is unremarkable. IMPRESSION: No acute findings. 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 Electronically Signed   By: Aletta Edouard M.D.   On: 02/14/2015 17:51   Ct Chest Wo Contrast  02/16/2015  CLINICAL DATA:  Anemia, leukocytosis and tarry stools. Recent admission for sepsis. History of COPD, diabetes and hypertension. History of bladder cancer and plasma cell  disorder. EXAM: CT CHEST, ABDOMEN AND PELVIS WITHOUT CONTRAST TECHNIQUE: Multidetector CT imaging of the chest, abdomen and pelvis was performed following the standard protocol without IV contrast. COMPARISON:  Chest CT 12/15/2007, abdominal pelvic CT 06/30/2014 and chest radiographs 02/10/2015. Skeletal survey 05/24/2014. FINDINGS: CT CHEST Mediastinum/Nodes: There are no enlarged mediastinal, hilar or axillary lymph nodes. Small mediastinal lymph nodes are stable. The thyroid gland, trachea and esophagus demonstrate no significant findings. The heart size is normal. There is no pericardial effusion. There is extensive atherosclerosis of the aorta, great vessels and coronary arteries. Lungs/Pleura: There is minimal dependent pleural fluid bilaterally. As demonstrated on recent radiographs, there is airspace disease with consolidation and air bronchograms in the left lower lobe. There is minimal dependent atelectasis at the right lung base. A 6 mm left upper lobe  nodule on image number 16 is stable. There are no new or enlarging pulmonary nodules. Mild emphysematous changes are present. Musculoskeletal/Chest wall: There is a T9 compression fracture which appears new and likely pathologic. There are multiple irregular lucent and sclerotic lesions throughout the thoracic spine and ribs. Small purely lytic lesion noted anteriorly in the left third rib (image 21). CT ABDOMEN AND PELVIS FINDINGS Hepatobiliary: As evaluated in the noncontrast state, the liver appears stable without suspicious findings. Small calcified gallstones are again noted. There is no gallbladder wall thickening or significant biliary dilatation. Pancreas: Unremarkable. No pancreatic ductal dilatation or surrounding inflammatory changes. Spleen: Normal in size without focal abnormality. Adrenals/Urinary Tract: Chronic right adrenal mass appears unchanged, measuring 3.4 x 3.1 cm on image 51. This contains calcifications, soft tissue and fatty  components. 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 The left adrenal gland appears normal. Multiple bilateral renal cysts are again noted, measuring up to 6.0 cm in the lower pole of the right kidney and up to 5.8 cm in the interpolar region of the left kidney. Both kidneys demonstrate mild cortical thinning. There is no evidence of urinary tract calculus or hydronephrosis. The bladder appears normal. Stomach/Bowel: There appears to be mild soft tissue stranding around the distal stomach, proximal duodenum and pancreatic head. This is suboptimally evaluated due to the lack of intravenous and oral contrast. There is no extraluminal fluid collection. No evidence of bowel wall thickening, distention or surrounding inflammatory change. Stable diverticular changes of the sigmoid colon. Vascular/Lymphatic: There are no enlarged abdominal or pelvic lymph nodes. Stable extensive atherosclerosis of the aorta, its branches and the iliac arteries. There is stable dilatation of the right common iliac artery. Reproductive: Unremarkable. Other: Stable asymmetric fat in the left inguinal canal and stable small umbilical hernia containing only fat. No evidence of ascites. Musculoskeletal: There are several new mixed lytic and sclerotic lesions within the lumbar spine. There is a dominant 2.2 cm sclerotic lesion in the S1 segment. Postsurgical changes are noted in the lower lumbar spine. There is no evidence of lumbar pathologic fracture or epidural tumor. IMPRESSION: 1. Possible inflammation involving the distal stomach and proximal duodenum in this patient with tarry stools. This is suboptimally evaluated by this examination. Consider endoscopic correlation. 2. The remainder of the small bowel and colon demonstrate no significant findings. There is distal colonic diverticulosis. 3. Left lower lobe pneumonia.  Small bilateral pleural effusions. 4. Multiple lytic and sclerotic lesions in  the spine, ribs and pelvis, suspicious for metastatic disease or multiple myeloma. Correlate clinically. Probable pathologic fracture at T9. 5. Stable additional incidental findings including extensive atherosclerosis, cholelithiasis and a chronic right adrenal mass. Electronically Signed   By: Richardean Sale M.D.   On: 02/16/2015 09:45   Mr Abdomen Wo Contrast  02/17/2015  CLINICAL DATA:  Evaluate adrenal mass EXAM: MRI ABDOMEN WITHOUT CONTRAST TECHNIQUE: Multiplanar multisequence MR imaging was performed without the administration of intravenous contrast. COMPARISON:  02/16/2015 FINDINGS: Lower chest: Small left effusion and left lower lobe airspace consolidation is noted. Hepatobiliary: Mild hepatic steatosis. There is no focal liver abnormality. Stones are identified within the gallbladder. These measure up to 6 mm. No biliary dilatation. The common bile duct has a normal caliber. Pancreas: Pancreas is unremarkable. Spleen: Negative Adrenals/Urinary Tract: Fat signal intensity within the 2.7 cm right adrenal mass is identified compatible with benign adrenal myelolipoma. Normal appearance of the left adrenal gland. Multiple bilateral renal cysts identified. The largest arises  from the upper pole the right kidney measuring 6.3 cm. 5.5 cm cyst arises from the midpole of left kidney. No obstructive uropathy. Stomach/Bowel: The stomach is normal. The visualized upper abdominal bowel loops have a normal caliber. Vascular/Lymphatic: Aortic atherosclerosis noted. No aneurysm. No upper abdominal adenopathy identified. Other: No ascites or focal fluid collections identified within the upper abdomen. Musculoskeletal: Inferior endplate lesion at the T9 level is again identified. Lesion involving the posterior aspect of the T12 vertebra and right posterior element is again identified suspicious for metastatic disease, image number 15 of series 4. IMPRESSION: 1. Lesion involving the right adrenal gland likely represents a  benign adrenal myelolipoma. 2. Mild hepatic steatosis. 3. Gallstones 4. Aortic atherosclerosis 5. T12 bone lesions suspicious for metastatic disease. 6. T9 compression fracture. Electronically Signed   By: Kerby Moors M.D.   On: 02/17/2015 07:39   Ct Biopsy  02/20/2015  CLINICAL DATA:  Sclerotic bone lesions, anemia, leukocytosis, concern for sclerotic metastases EXAM: CT-GUIDED BIOPSY LEFT ILIAC SCLEROTIC BONE LESION MEDICATIONS AND MEDICAL HISTORY: Versed 1.0 mg, Fentanyl 50 mcg. Additional Medications: None. ANESTHESIA/SEDATION: Moderate sedation time: 11 minutes PROCEDURE: The procedure, risks, benefits, and alternatives were explained to the patient. Questions regarding the procedure were encouraged and answered. The patient understands and consents to the procedure. Previous imaging reviewed. Patient positioned prone. Noncontrast localization CT performed. The sclerotic left iliac bone lesion was localized. The left posterior hip region was prepped with chlorhexidine in a sterile fashion, and a sterile drape was applied covering the operative field. A sterile gown and sterile gloves were used for the procedure. Under CT guidance, a(n) 11 gauge gauge guide needle was advanced into the left iliac sclerotic bone lesion. Position confirmed with CT. 11 gauge core biopsy performed with the drill set. Sample placed in formalin. Final imaging was performed. Patient tolerated the procedure well without complication. Vital sign monitoring by nursing staff during the procedure will continue as patient is in the special procedures unit for post procedure observation. FINDINGS: The images document guide needle placement within the left iliac sclerotic bone lesion. Post biopsy images demonstrate hemorrhage or hematoma. COMPLICATIONS: None immediate IMPRESSION: Successful CT-guided 11 gauge core biopsy of the left iliac sclerotic bone lesion. Electronically Signed   By: Jerilynn Mages.  Shick M.D.   On: 02/20/2015 17:07   Dg  Chest Port 1 View  02/14/2015  CLINICAL DATA:  Pneumonia. EXAM: PORTABLE CHEST 1 VIEW COMPARISON:  02/10/2015 FINDINGS: Left lower lobe infiltrate again identified. This may be slightly smaller and less dense compared to the prior chest x-ray. No edema or pleural fluid. The heart size and mediastinal contours are normal. IMPRESSION: Slightly smaller and less dense appearing left lower lobe pneumonia. Electronically Signed   By: Aletta Edouard M.D.   On: 02/14/2015 15:18     Assessment/Plan   ICD-9-CM ICD-10-CM   1. Metastatic adenocarcinoma (HCC) 199.1 C79.9   2. Type 2 diabetes mellitus with hyperosmolarity without coma, without long-term current use of insulin (HCC) - stable on metformin 250.20 E11.00   3. Essential hypertension BP controlled 401.9 I10   4. Centrilobular emphysema (HCC) - stable on HFA and nebs; on O2 492.8 J43.2   5. Typical atrial flutter (HCC) - rate controlled on cardizem 427.32 I48.3   6. Weakness generalized - deconditioned due to adenoCA and recently tx pneumonia 780.79 R53.1   7. Depression with anxiety - stable on cymbalta and xanax 300.4 F41.8   8. Hypothyroidism, unspecified hypothyroidism type - stable on levothyroxine 244.9 E03.9  Pain controlled on current tx  Repeat CBC w diff and BMP  F/u with oncology (lung CA) and endocrinology (DM/hypothyroid) as scheduled  PT/OT as ordered  Cont current meds as ordered  Cont nutritional supplements as ordered  Palliative care consult pending  [place] O2 at 2.5L/min qhs  GOAL: short term rehab and d/c home vs hospice house when medically appropriate. He has a poor prognosis. Communicated with pt and nursing.  Will follow  Marne Meline S. Perlie Gold  [facility] [address] [place] at [place], [place] [address] [phone] Cell (Monday-Friday 8 AM - 5 PM) [phone] After 5 PM and follow prompts

## 2015-03-08 DEATH — deceased

## 2015-03-13 ENCOUNTER — Ambulatory Visit: Payer: Medicare Other | Admitting: Oncology

## 2015-03-13 ENCOUNTER — Other Ambulatory Visit: Payer: Medicare Other

## 2016-03-28 IMAGING — CT CT BIOPSY
1 of 2 series · 15 of 32 positions shown, 19 images · non-contrast
Comparison: none

CLINICAL DATA: Sclerotic bone lesions, anemia, leukocytosis,
concern for sclerotic metastases

[Series 2: i-spiral 5.0 b40f · axial · 0.98mm/px · z∈[+450,+621]mm · 15 of 55 slices shown, 19 images]
[im 3/55  soft-tissue]
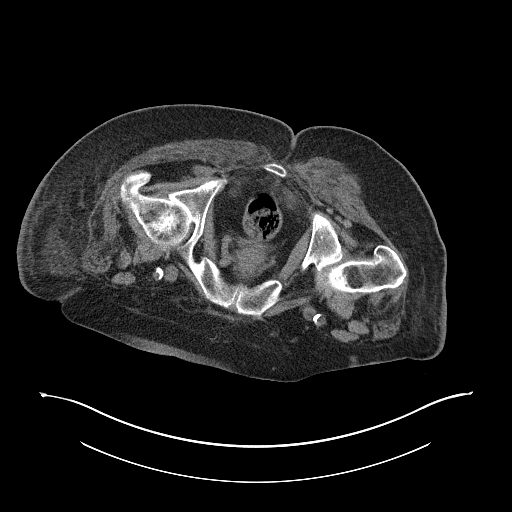
[im 3/55  bone]
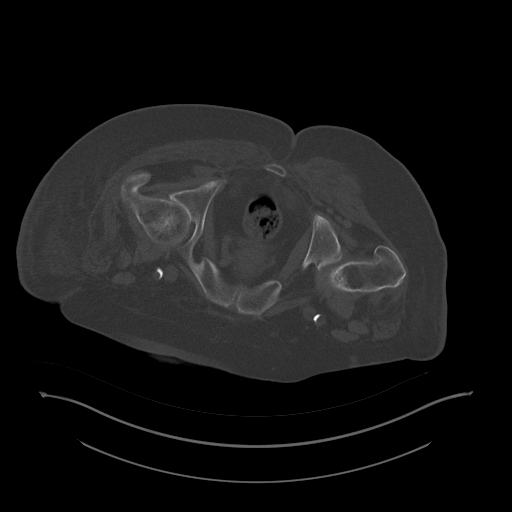
[im 8/55  soft-tissue]
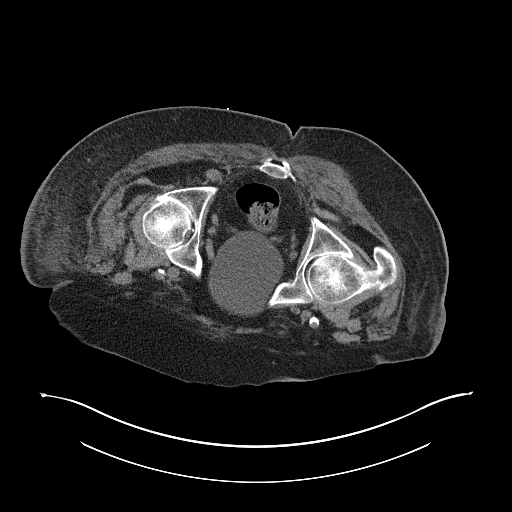
[im 13/55  soft-tissue]
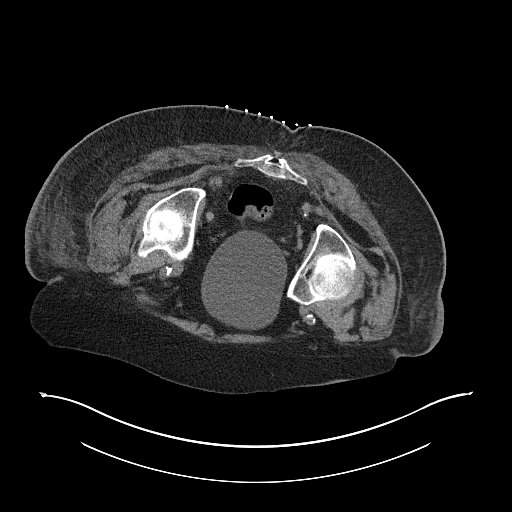
[im 15/55  soft-tissue]
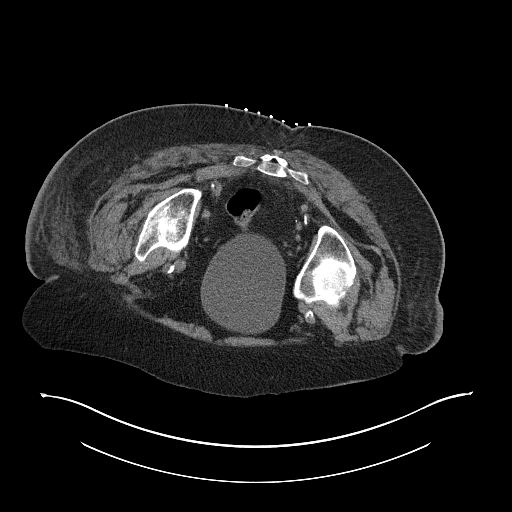
[im 20/55  soft-tissue]
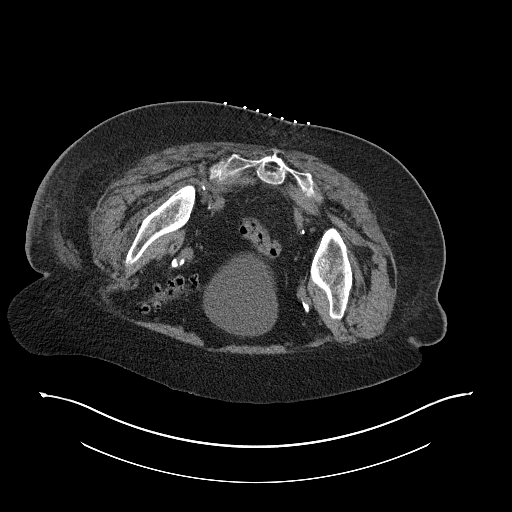
[im 23/55  soft-tissue]
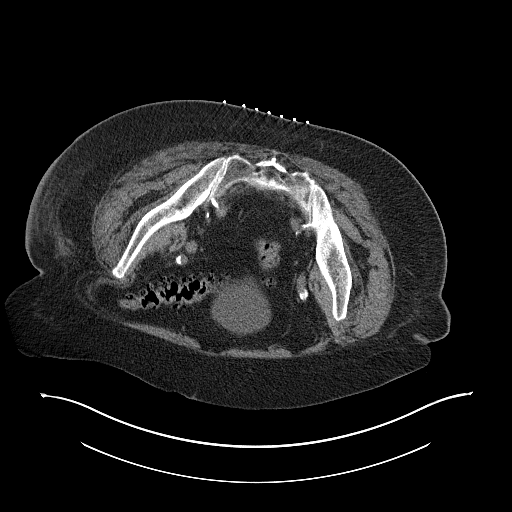
[im 28/55  soft-tissue]
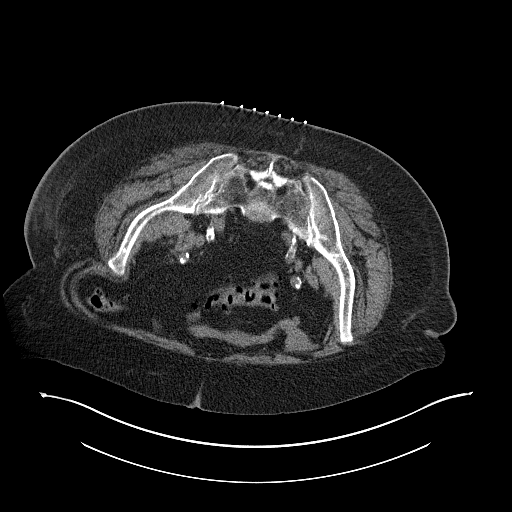
[im 32/55  soft-tissue]
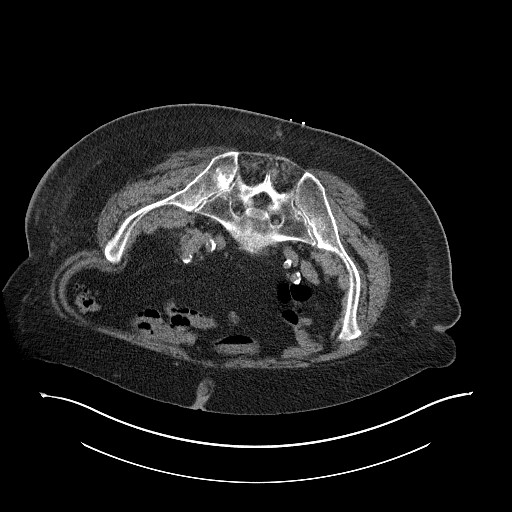
[im 35/55  soft-tissue]
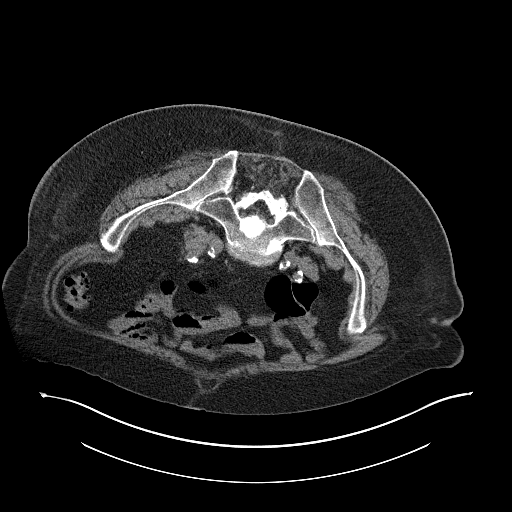
[im 35/55  bone]
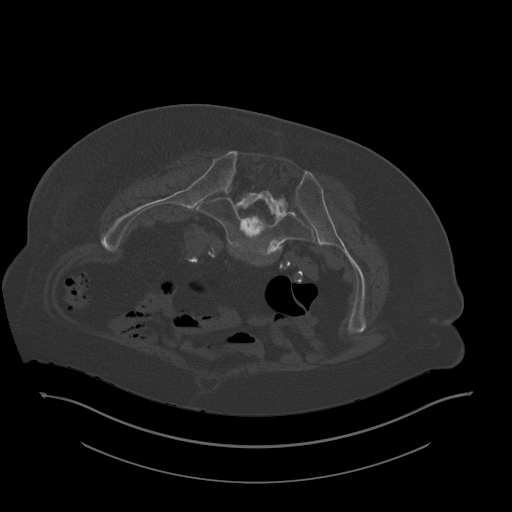
[im 40/55  soft-tissue]
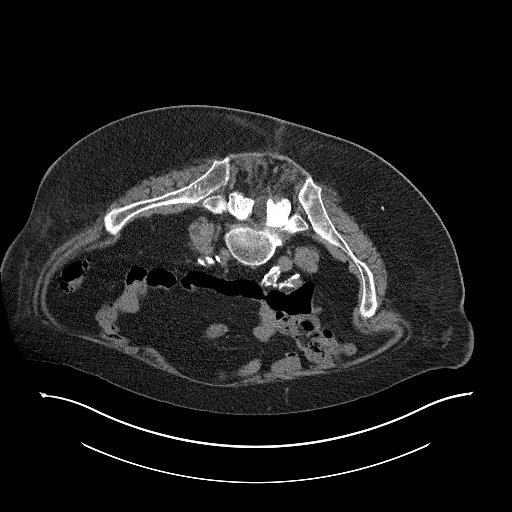
[im 42/55  soft-tissue]
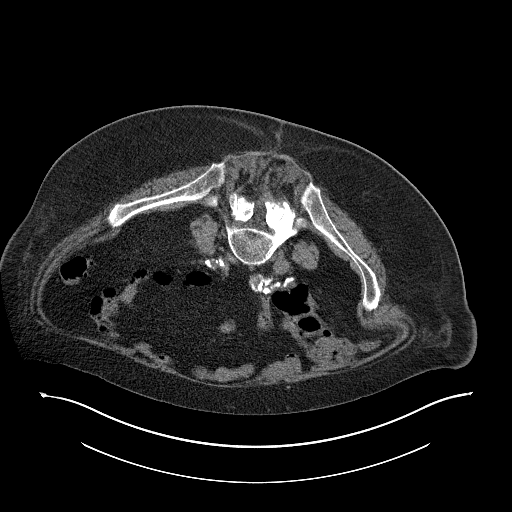
[im 45/55  lung]
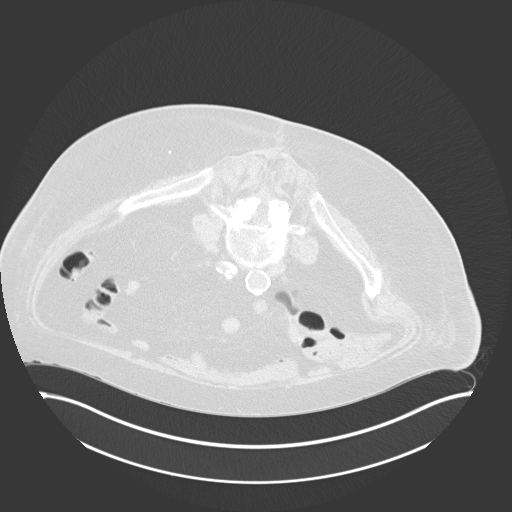
[im 47/55  soft-tissue]
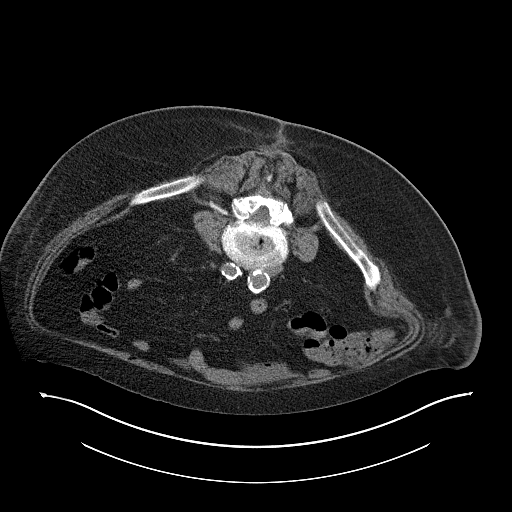
[im 47/55  lung]
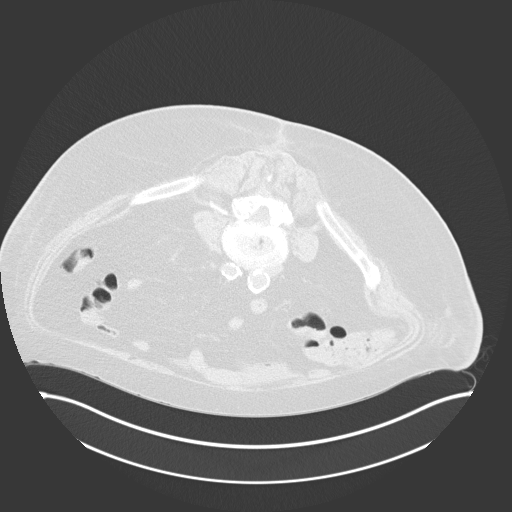
[im 50/55  lung]
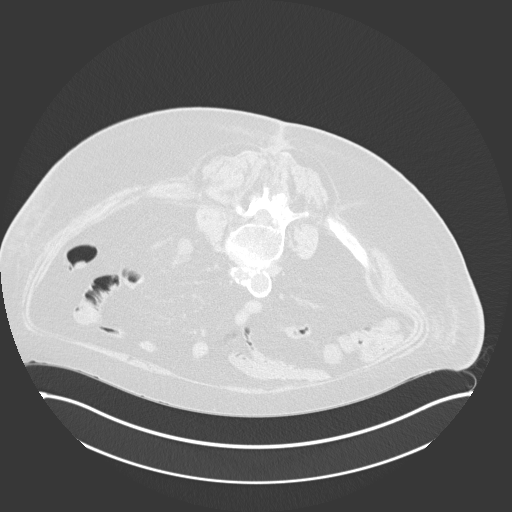
[im 52/55  soft-tissue]
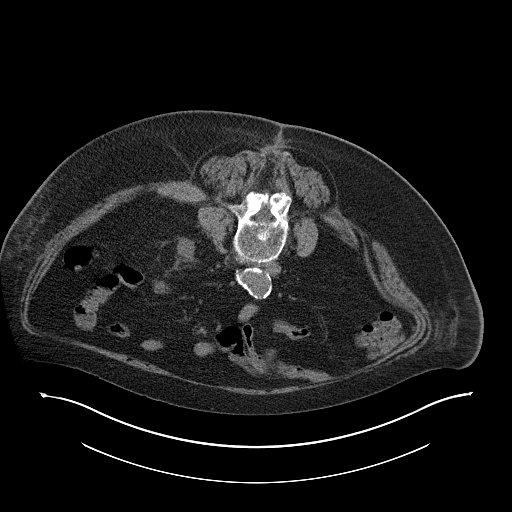
[im 52/55  lung]
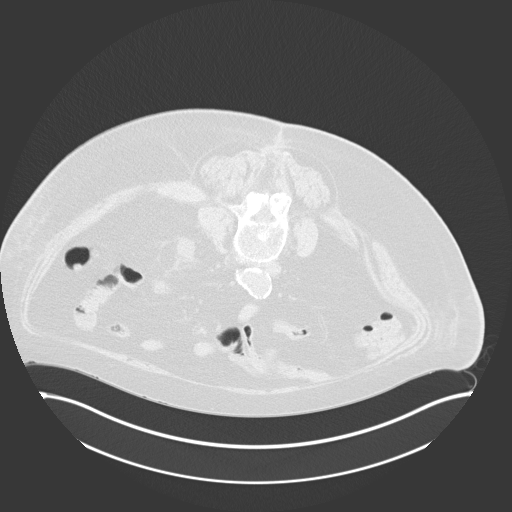

[15 of 32 positions shown; findings below may reference images not displayed]

EXAM:
CT-GUIDED BIOPSY LEFT ILIAC SCLEROTIC BONE LESION

MEDICATIONS AND MEDICAL HISTORY:
Versed 1.0 mg, Fentanyl 50 mcg.

Additional Medications: None..

ANESTHESIA/SEDATION:
Moderate sedation time: 11 minutes

PROCEDURE:
The procedure, risks, benefits, and alternatives were explained to
the patient. Questions regarding the procedure were encouraged and
answered. The patient understands and consents to the procedure.

Previous imaging reviewed. Patient positioned prone. Noncontrast
localization CT performed. The sclerotic left iliac bone lesion was
localized.

The left posterior hip region was prepped with chlorhexidine in a
sterile fashion, and a sterile drape was applied covering the
operative field. A sterile gown and sterile gloves were used for the
procedure.

Under CT guidance, a(n) 11 gauge gauge guide needle was advanced
into the left iliac sclerotic bone lesion. Position confirmed with
CT. 11 gauge core biopsy performed with the drill set. Sample placed
in formalin. Final imaging was performed.

Patient tolerated the procedure well without complication. Vital
sign monitoring by nursing staff during the procedure will continue
as patient is in the special procedures unit for post procedure
observation.
FINDINGS: The images document guide needle placement within the left iliac
sclerotic bone lesion. Post biopsy images demonstrate hemorrhage or
hematoma.

COMPLICATIONS:
None immediate
IMPRESSION: Successful CT-guided 11 gauge core biopsy of the left iliac
sclerotic bone lesion.
